# Patient Record
Sex: Female | Born: 1955 | Race: White | Hispanic: No | Marital: Married | Smoking: Current some day smoker
Health system: Southern US, Community
[De-identification: ages and names within clinical notes are randomized; demographics above are authoritative.]

## PROBLEM LIST (undated history)

## (undated) DIAGNOSIS — K649 Unspecified hemorrhoids: Secondary | ICD-10-CM

## (undated) DIAGNOSIS — K5790 Diverticulosis of intestine, part unspecified, without perforation or abscess without bleeding: Secondary | ICD-10-CM

## (undated) DIAGNOSIS — K219 Gastro-esophageal reflux disease without esophagitis: Secondary | ICD-10-CM

## (undated) DIAGNOSIS — C50919 Malignant neoplasm of unspecified site of unspecified female breast: Secondary | ICD-10-CM

## (undated) DIAGNOSIS — F410 Panic disorder [episodic paroxysmal anxiety] without agoraphobia: Secondary | ICD-10-CM

## (undated) DIAGNOSIS — D126 Benign neoplasm of colon, unspecified: Secondary | ICD-10-CM

## (undated) DIAGNOSIS — E042 Nontoxic multinodular goiter: Secondary | ICD-10-CM

## (undated) DIAGNOSIS — Z8489 Family history of other specified conditions: Secondary | ICD-10-CM

## (undated) DIAGNOSIS — K2970 Gastritis, unspecified, without bleeding: Secondary | ICD-10-CM

## (undated) DIAGNOSIS — M810 Age-related osteoporosis without current pathological fracture: Secondary | ICD-10-CM

## (undated) DIAGNOSIS — Z8619 Personal history of other infectious and parasitic diseases: Secondary | ICD-10-CM

## (undated) DIAGNOSIS — F329 Major depressive disorder, single episode, unspecified: Secondary | ICD-10-CM

## (undated) DIAGNOSIS — K635 Polyp of colon: Secondary | ICD-10-CM

## (undated) DIAGNOSIS — E0590 Thyrotoxicosis, unspecified without thyrotoxic crisis or storm: Secondary | ICD-10-CM

## (undated) DIAGNOSIS — Z923 Personal history of irradiation: Secondary | ICD-10-CM

## (undated) DIAGNOSIS — Z720 Tobacco use: Secondary | ICD-10-CM

## (undated) DIAGNOSIS — F32A Depression, unspecified: Secondary | ICD-10-CM

## (undated) DIAGNOSIS — J329 Chronic sinusitis, unspecified: Secondary | ICD-10-CM

## (undated) DIAGNOSIS — A048 Other specified bacterial intestinal infections: Secondary | ICD-10-CM

## (undated) DIAGNOSIS — H9192 Unspecified hearing loss, left ear: Secondary | ICD-10-CM

## (undated) DIAGNOSIS — C50912 Malignant neoplasm of unspecified site of left female breast: Secondary | ICD-10-CM

## (undated) DIAGNOSIS — J449 Chronic obstructive pulmonary disease, unspecified: Secondary | ICD-10-CM

## (undated) DIAGNOSIS — M1990 Unspecified osteoarthritis, unspecified site: Secondary | ICD-10-CM

## (undated) DIAGNOSIS — C3490 Malignant neoplasm of unspecified part of unspecified bronchus or lung: Secondary | ICD-10-CM

## (undated) DIAGNOSIS — I10 Essential (primary) hypertension: Secondary | ICD-10-CM

## (undated) HISTORY — DX: Benign neoplasm of colon, unspecified: D12.6

## (undated) HISTORY — DX: Unspecified hemorrhoids: K64.9

## (undated) HISTORY — PX: TONSILLECTOMY: SUR1361

## (undated) HISTORY — DX: Personal history of other infectious and parasitic diseases: Z86.19

## (undated) HISTORY — PX: TRIGGER FINGER RELEASE: SHX641

## (undated) HISTORY — DX: Depression, unspecified: F32.A

## (undated) HISTORY — DX: Polyp of colon: K63.5

## (undated) HISTORY — PX: ESOPHAGOGASTRODUODENOSCOPY: SHX1529

## (undated) HISTORY — DX: Major depressive disorder, single episode, unspecified: F32.9

## (undated) HISTORY — DX: Diverticulosis of intestine, part unspecified, without perforation or abscess without bleeding: K57.90

## (undated) HISTORY — PX: BREAST EXCISIONAL BIOPSY: SUR124

## (undated) HISTORY — DX: Tobacco use: Z72.0

## (undated) HISTORY — PX: BUNIONECTOMY: SHX129

## (undated) HISTORY — PX: MULTIPLE TOOTH EXTRACTIONS: SHX2053

## (undated) HISTORY — PX: OTHER SURGICAL HISTORY: SHX169

## (undated) HISTORY — PX: NASAL SEPTUM SURGERY: SHX37

## (undated) HISTORY — DX: Malignant neoplasm of unspecified site of left female breast: C50.912

## (undated) HISTORY — PX: GANGLION CYST EXCISION: SHX1691

---

## 2005-02-13 ENCOUNTER — Ambulatory Visit: Payer: Self-pay | Admitting: Internal Medicine

## 2005-09-26 ENCOUNTER — Ambulatory Visit: Payer: Self-pay | Admitting: Orthopedic Surgery

## 2006-03-30 ENCOUNTER — Ambulatory Visit: Payer: Self-pay | Admitting: Urology

## 2006-07-27 ENCOUNTER — Ambulatory Visit: Payer: Self-pay | Admitting: Unknown Physician Specialty

## 2007-09-24 ENCOUNTER — Ambulatory Visit: Payer: Self-pay | Admitting: Internal Medicine

## 2007-10-29 ENCOUNTER — Ambulatory Visit: Payer: Self-pay | Admitting: Gastroenterology

## 2008-08-11 ENCOUNTER — Ambulatory Visit: Payer: Self-pay | Admitting: Specialist

## 2008-08-13 ENCOUNTER — Ambulatory Visit: Payer: Self-pay | Admitting: Gastroenterology

## 2008-09-10 ENCOUNTER — Ambulatory Visit: Payer: Self-pay | Admitting: Specialist

## 2008-10-27 ENCOUNTER — Ambulatory Visit: Payer: Self-pay | Admitting: Gastroenterology

## 2009-05-12 ENCOUNTER — Ambulatory Visit: Payer: Self-pay | Admitting: Neurology

## 2010-02-08 ENCOUNTER — Ambulatory Visit: Payer: Self-pay | Admitting: Gastroenterology

## 2010-02-15 ENCOUNTER — Ambulatory Visit: Payer: Self-pay | Admitting: Unknown Physician Specialty

## 2010-03-22 ENCOUNTER — Ambulatory Visit: Payer: Self-pay | Admitting: Gastroenterology

## 2010-03-26 LAB — PATHOLOGY REPORT

## 2010-04-17 DIAGNOSIS — C50919 Malignant neoplasm of unspecified site of unspecified female breast: Secondary | ICD-10-CM

## 2010-04-17 DIAGNOSIS — C50912 Malignant neoplasm of unspecified site of left female breast: Secondary | ICD-10-CM

## 2010-04-17 HISTORY — DX: Malignant neoplasm of unspecified site of unspecified female breast: C50.919

## 2010-04-17 HISTORY — DX: Malignant neoplasm of unspecified site of left female breast: C50.912

## 2010-04-17 HISTORY — PX: BREAST EXCISIONAL BIOPSY: SUR124

## 2010-04-17 HISTORY — PX: BREAST LUMPECTOMY: SHX2

## 2010-07-17 HISTORY — PX: MASTECTOMY: SHX3

## 2010-07-21 ENCOUNTER — Ambulatory Visit: Payer: Self-pay | Admitting: Surgery

## 2010-08-01 ENCOUNTER — Ambulatory Visit: Payer: Self-pay | Admitting: Surgery

## 2010-08-08 ENCOUNTER — Ambulatory Visit: Payer: Self-pay | Admitting: Radiation Oncology

## 2010-08-11 ENCOUNTER — Ambulatory Visit: Payer: Self-pay | Admitting: Surgery

## 2010-08-15 LAB — PATHOLOGY REPORT

## 2010-08-16 ENCOUNTER — Ambulatory Visit: Payer: Self-pay | Admitting: Radiation Oncology

## 2010-08-24 ENCOUNTER — Ambulatory Visit: Payer: Self-pay | Admitting: Internal Medicine

## 2010-09-16 ENCOUNTER — Ambulatory Visit: Payer: Self-pay | Admitting: Radiation Oncology

## 2010-09-16 ENCOUNTER — Ambulatory Visit: Payer: Self-pay | Admitting: Internal Medicine

## 2010-10-16 ENCOUNTER — Ambulatory Visit: Payer: Self-pay | Admitting: Internal Medicine

## 2010-10-16 ENCOUNTER — Ambulatory Visit: Payer: Self-pay | Admitting: Radiation Oncology

## 2010-11-16 ENCOUNTER — Ambulatory Visit: Payer: Self-pay | Admitting: Internal Medicine

## 2010-11-16 ENCOUNTER — Ambulatory Visit: Payer: Self-pay | Admitting: Radiation Oncology

## 2011-02-03 ENCOUNTER — Emergency Department: Payer: Self-pay | Admitting: Emergency Medicine

## 2011-02-06 ENCOUNTER — Ambulatory Visit: Payer: Self-pay | Admitting: Internal Medicine

## 2011-02-16 ENCOUNTER — Ambulatory Visit: Payer: Self-pay | Admitting: Internal Medicine

## 2011-02-17 ENCOUNTER — Ambulatory Visit: Payer: Self-pay | Admitting: Gastroenterology

## 2011-02-21 LAB — PATHOLOGY REPORT

## 2011-05-08 ENCOUNTER — Ambulatory Visit: Payer: Self-pay | Admitting: Internal Medicine

## 2011-05-19 ENCOUNTER — Ambulatory Visit: Payer: Self-pay | Admitting: Internal Medicine

## 2011-08-07 ENCOUNTER — Ambulatory Visit: Payer: Self-pay | Admitting: Internal Medicine

## 2011-08-07 LAB — CBC CANCER CENTER
Basophil #: 0 x10 3/mm (ref 0.0–0.1)
Basophil %: 0.3 %
Eosinophil #: 0.1 x10 3/mm (ref 0.0–0.7)
Eosinophil %: 1.3 %
HGB: 14 g/dL (ref 12.0–16.0)
Lymphocyte %: 22.7 %
MCHC: 33.1 g/dL (ref 32.0–36.0)
MCV: 99 fL (ref 80–100)
Neutrophil #: 4.4 x10 3/mm (ref 1.4–6.5)
Neutrophil %: 65.8 %
RBC: 4.28 10*6/uL (ref 3.80–5.20)
RDW: 12.8 % (ref 11.5–14.5)
WBC: 6.7 x10 3/mm (ref 3.6–11.0)

## 2011-08-07 LAB — HEPATIC FUNCTION PANEL A (ARMC)
Alkaline Phosphatase: 91 U/L (ref 50–136)
Bilirubin, Direct: 0.2 mg/dL (ref 0.00–0.20)
Bilirubin,Total: 0.6 mg/dL (ref 0.2–1.0)
SGOT(AST): 12 U/L — ABNORMAL LOW (ref 15–37)
SGPT (ALT): 17 U/L

## 2011-08-07 LAB — CREATININE, SERUM: EGFR (Non-African Amer.): >60

## 2011-08-16 ENCOUNTER — Ambulatory Visit: Payer: Self-pay | Admitting: Internal Medicine

## 2011-10-30 ENCOUNTER — Ambulatory Visit: Payer: Self-pay | Admitting: Neurology

## 2011-10-30 LAB — CREATININE, SERUM: EGFR (Non-African Amer.): >60

## 2011-11-07 ENCOUNTER — Ambulatory Visit: Payer: Self-pay | Admitting: Gastroenterology

## 2012-02-06 ENCOUNTER — Ambulatory Visit: Payer: Self-pay | Admitting: Internal Medicine

## 2012-02-06 LAB — CBC CANCER CENTER
Basophil %: 0.4 %
Eosinophil #: 0.1 x10 3/mm (ref 0.0–0.7)
HCT: 45.1 % (ref 35.0–47.0)
HGB: 14.8 g/dL (ref 12.0–16.0)
Lymphocyte #: 3.3 x10 3/mm (ref 1.0–3.6)
Lymphocyte %: 42.1 %
MCH: 32.2 pg (ref 26.0–34.0)
MCHC: 32.8 g/dL (ref 32.0–36.0)
MCV: 98 fL (ref 80–100)
Monocyte #: 1 x10 3/mm — ABNORMAL HIGH (ref 0.2–0.9)
Neutrophil #: 3.4 x10 3/mm (ref 1.4–6.5)
Platelet: 236 x10 3/mm (ref 150–440)

## 2012-02-06 LAB — HEPATIC FUNCTION PANEL A (ARMC)
Albumin: 4.1 g/dL (ref 3.4–5.0)
Alkaline Phosphatase: 91 U/L (ref 50–136)
Bilirubin, Direct: 0.1 mg/dL (ref 0.00–0.20)
Bilirubin,Total: 0.7 mg/dL (ref 0.2–1.0)
SGOT(AST): 10 U/L — ABNORMAL LOW (ref 15–37)

## 2012-02-06 LAB — CREATININE, SERUM
Creatinine: 0.99 mg/dL (ref 0.60–1.30)
EGFR (Non-African Amer.): >60

## 2012-02-06 LAB — POTASSIUM: Potassium: 3.8 mmol/L (ref 3.5–5.1)

## 2012-02-06 LAB — SODIUM: Sodium: 136 mmol/L (ref 136–145)

## 2012-02-16 ENCOUNTER — Ambulatory Visit: Payer: Self-pay | Admitting: Internal Medicine

## 2012-04-17 ENCOUNTER — Ambulatory Visit: Payer: Self-pay | Admitting: Internal Medicine

## 2012-05-08 LAB — CBC CANCER CENTER
Basophil #: 0 x10 3/mm (ref 0.0–0.1)
Basophil %: 0.7 %
HCT: 41.2 % (ref 35.0–47.0)
HGB: 14.2 g/dL (ref 12.0–16.0)
Lymphocyte #: 2 x10 3/mm (ref 1.0–3.6)
MCHC: 34.4 g/dL (ref 32.0–36.0)
MCV: 97 fL (ref 80–100)
Monocyte #: 0.8 x10 3/mm (ref 0.2–0.9)
Monocyte %: 11.7 %
Neutrophil #: 3.6 x10 3/mm (ref 1.4–6.5)
Neutrophil %: 55.1 %
Platelet: 207 x10 3/mm (ref 150–440)
RBC: 4.27 10*6/uL (ref 3.80–5.20)
RDW: 12.2 % (ref 11.5–14.5)
WBC: 6.6 x10 3/mm (ref 3.6–11.0)

## 2012-05-08 LAB — CREATININE, SERUM
Creatinine: 0.9 mg/dL (ref 0.60–1.30)
EGFR (African American): >60
EGFR (Non-African Amer.): >60

## 2012-05-08 LAB — HEPATIC FUNCTION PANEL A (ARMC)
Albumin: 3.7 g/dL (ref 3.4–5.0)
Alkaline Phosphatase: 92 U/L (ref 50–136)
Bilirubin,Total: 0.7 mg/dL (ref 0.2–1.0)
SGOT(AST): 11 U/L — ABNORMAL LOW (ref 15–37)
SGPT (ALT): 14 U/L (ref 12–78)

## 2012-05-18 ENCOUNTER — Ambulatory Visit: Payer: Self-pay | Admitting: Internal Medicine

## 2012-10-22 ENCOUNTER — Ambulatory Visit: Payer: Self-pay | Admitting: Gastroenterology

## 2012-11-07 ENCOUNTER — Ambulatory Visit: Payer: Self-pay | Admitting: Internal Medicine

## 2012-11-12 LAB — CREATININE, SERUM
Creatinine: 0.89 mg/dL (ref 0.60–1.30)
EGFR (African American): >60

## 2012-11-12 LAB — CBC CANCER CENTER
Basophil #: 0.1 x10 3/mm (ref 0.0–0.1)
Eosinophil %: 1.1 %
HGB: 14.1 g/dL (ref 12.0–16.0)
Lymphocyte #: 2.5 x10 3/mm (ref 1.0–3.6)
Lymphocyte %: 31.9 %
MCV: 96 fL (ref 80–100)
Monocyte #: 0.9 x10 3/mm (ref 0.2–0.9)
Monocyte %: 10.7 %
Neutrophil %: 55.7 %
Platelet: 239 x10 3/mm (ref 150–440)
RDW: 12.9 % (ref 11.5–14.5)
WBC: 8 x10 3/mm (ref 3.6–11.0)

## 2012-11-12 LAB — HEPATIC FUNCTION PANEL A (ARMC)
Albumin: 3.6 g/dL (ref 3.4–5.0)
Bilirubin, Direct: 0.1 mg/dL (ref 0.00–0.20)
SGOT(AST): 21 U/L (ref 15–37)

## 2012-11-15 ENCOUNTER — Ambulatory Visit: Payer: Self-pay | Admitting: Internal Medicine

## 2013-03-10 ENCOUNTER — Ambulatory Visit: Payer: Self-pay | Admitting: Internal Medicine

## 2013-05-06 ENCOUNTER — Ambulatory Visit: Payer: Self-pay | Admitting: Internal Medicine

## 2013-05-16 LAB — CBC CANCER CENTER
Basophil #: 0 "x10 3/mm "
Basophil %: 0.6 %
Eosinophil #: 0.1 "x10 3/mm "
Eosinophil %: 1.5 %
HCT: 41.5 %
HGB: 13.7 g/dL
Lymphocyte %: 34.4 %
Lymphs Abs: 2.4 "x10 3/mm "
MCH: 32.1 pg
MCHC: 33 g/dL
MCV: 97 fL
Monocyte #: 0.8 "x10 3/mm "
Monocyte %: 11.4 %
Neutrophil #: 3.5 "x10 3/mm "
Neutrophil %: 52.1 %
Platelet: 223 "x10 3/mm "
RBC: 4.27 "x10 6/mm "
RDW: 12.7 %
WBC: 6.8 "x10 3/mm "

## 2013-05-16 LAB — HEPATIC FUNCTION PANEL A (ARMC)
ALBUMIN: 3.6 g/dL (ref 3.4–5.0)
ALK PHOS: 58 U/L
AST: 9 U/L — AB (ref 15–37)
Bilirubin, Direct: 0.1 mg/dL (ref 0.00–0.20)
Bilirubin,Total: 0.6 mg/dL (ref 0.2–1.0)
SGPT (ALT): 10 U/L — ABNORMAL LOW (ref 12–78)
TOTAL PROTEIN: 7.9 g/dL (ref 6.4–8.2)

## 2013-05-16 LAB — CREATININE, SERUM
Creatinine: 0.9 mg/dL
EGFR (African American): >60
EGFR (Non-African Amer.): >60

## 2013-05-18 ENCOUNTER — Ambulatory Visit: Payer: Self-pay | Admitting: Internal Medicine

## 2013-11-12 ENCOUNTER — Ambulatory Visit: Payer: Self-pay | Admitting: Internal Medicine

## 2013-11-12 LAB — CBC CANCER CENTER
Basophil #: 0 x10 3/mm (ref 0.0–0.1)
Basophil %: 0.4 %
EOS ABS: 0.1 x10 3/mm (ref 0.0–0.7)
Eosinophil %: 0.8 %
HCT: 43.4 % (ref 35.0–47.0)
HGB: 14.5 g/dL (ref 12.0–16.0)
Lymphocyte #: 2.4 x10 3/mm (ref 1.0–3.6)
Lymphocyte %: 30 %
MCH: 33 pg (ref 26.0–34.0)
MCHC: 33.5 g/dL (ref 32.0–36.0)
MCV: 98 fL (ref 80–100)
MONO ABS: 0.8 x10 3/mm (ref 0.2–0.9)
MONOS PCT: 10 %
Neutrophil #: 4.8 x10 3/mm (ref 1.4–6.5)
Neutrophil %: 58.8 %
Platelet: 215 x10 3/mm (ref 150–440)
RBC: 4.41 10*6/uL (ref 3.80–5.20)
RDW: 13 % (ref 11.5–14.5)
WBC: 8.1 x10 3/mm (ref 3.6–11.0)

## 2013-11-12 LAB — HEPATIC FUNCTION PANEL A (ARMC)
ALT: 15 U/L
AST: 10 U/L — AB (ref 15–37)
Albumin: 3.7 g/dL (ref 3.4–5.0)
Alkaline Phosphatase: 49 U/L
BILIRUBIN DIRECT: 0.1 mg/dL (ref 0.00–0.20)
BILIRUBIN TOTAL: 0.7 mg/dL (ref 0.2–1.0)
Total Protein: 7.6 g/dL (ref 6.4–8.2)

## 2013-11-12 LAB — CREATININE, SERUM
Creatinine: 0.97 mg/dL (ref 0.60–1.30)
EGFR (African American): >60

## 2013-11-15 ENCOUNTER — Ambulatory Visit: Payer: Self-pay | Admitting: Internal Medicine

## 2013-12-16 ENCOUNTER — Ambulatory Visit: Payer: Self-pay | Admitting: Internal Medicine

## 2014-03-11 ENCOUNTER — Ambulatory Visit: Payer: Self-pay | Admitting: Internal Medicine

## 2014-05-06 ENCOUNTER — Ambulatory Visit: Payer: Self-pay | Admitting: Internal Medicine

## 2014-05-18 ENCOUNTER — Ambulatory Visit: Payer: Self-pay | Admitting: Internal Medicine

## 2014-06-16 ENCOUNTER — Ambulatory Visit
Admit: 2014-06-16 | Disposition: A | Discharge status: Home / Self Care | Payer: Self-pay | Attending: Internal Medicine | Admitting: Internal Medicine

## 2014-07-15 ENCOUNTER — Ambulatory Visit
Admit: 2014-07-15 | Disposition: A | Discharge status: Home / Self Care | Payer: Self-pay | Attending: Vascular Surgery | Admitting: Vascular Surgery

## 2014-07-22 DIAGNOSIS — J449 Chronic obstructive pulmonary disease, unspecified: Secondary | ICD-10-CM | POA: Insufficient documentation

## 2014-07-22 DIAGNOSIS — I808 Phlebitis and thrombophlebitis of other sites: Secondary | ICD-10-CM | POA: Insufficient documentation

## 2014-07-31 ENCOUNTER — Other Ambulatory Visit: Payer: Self-pay | Admitting: Internal Medicine

## 2014-07-31 DIAGNOSIS — Z853 Personal history of malignant neoplasm of breast: Secondary | ICD-10-CM

## 2014-08-21 ENCOUNTER — Other Ambulatory Visit: Payer: Self-pay | Admitting: Rheumatology

## 2014-08-21 DIAGNOSIS — G8929 Other chronic pain: Secondary | ICD-10-CM

## 2014-08-21 DIAGNOSIS — M25512 Pain in left shoulder: Principal | ICD-10-CM

## 2014-08-28 ENCOUNTER — Ambulatory Visit
Admission: RE | Admit: 2014-08-28 | Discharge: 2014-08-28 | Disposition: A | Discharge status: Home / Self Care | Payer: 59 | Source: Ambulatory Visit | Attending: Rheumatology | Admitting: Rheumatology

## 2014-08-28 DIAGNOSIS — R531 Weakness: Secondary | ICD-10-CM | POA: Diagnosis present

## 2014-08-28 DIAGNOSIS — G8929 Other chronic pain: Secondary | ICD-10-CM

## 2014-08-28 DIAGNOSIS — M25512 Pain in left shoulder: Secondary | ICD-10-CM | POA: Diagnosis present

## 2014-08-28 DIAGNOSIS — X58XXXA Exposure to other specified factors, initial encounter: Secondary | ICD-10-CM | POA: Diagnosis not present

## 2014-08-28 DIAGNOSIS — S43432A Superior glenoid labrum lesion of left shoulder, initial encounter: Secondary | ICD-10-CM | POA: Insufficient documentation

## 2014-11-17 ENCOUNTER — Other Ambulatory Visit: Payer: Self-pay | Admitting: Gastroenterology

## 2014-11-17 DIAGNOSIS — E042 Nontoxic multinodular goiter: Secondary | ICD-10-CM

## 2014-11-20 ENCOUNTER — Ambulatory Visit
Admission: RE | Admit: 2014-11-20 | Discharge: 2014-11-20 | Disposition: A | Discharge status: Home / Self Care | Payer: 59 | Source: Ambulatory Visit | Attending: Gastroenterology | Admitting: Gastroenterology

## 2014-11-20 DIAGNOSIS — E042 Nontoxic multinodular goiter: Secondary | ICD-10-CM | POA: Insufficient documentation

## 2014-12-09 DIAGNOSIS — F17200 Nicotine dependence, unspecified, uncomplicated: Secondary | ICD-10-CM | POA: Insufficient documentation

## 2014-12-09 DIAGNOSIS — E0590 Thyrotoxicosis, unspecified without thyrotoxic crisis or storm: Secondary | ICD-10-CM | POA: Insufficient documentation

## 2014-12-09 DIAGNOSIS — E042 Nontoxic multinodular goiter: Secondary | ICD-10-CM | POA: Insufficient documentation

## 2014-12-25 ENCOUNTER — Encounter: Payer: Self-pay | Admitting: *Deleted

## 2014-12-28 ENCOUNTER — Ambulatory Visit: Payer: Medicare Other | Admitting: Certified Registered"

## 2014-12-28 ENCOUNTER — Encounter
Admission: RE | Disposition: A | Discharge status: Home / Self Care | Payer: Self-pay | Source: Ambulatory Visit | Attending: Gastroenterology

## 2014-12-28 ENCOUNTER — Ambulatory Visit
Admission: RE | Admit: 2014-12-28 | Discharge: 2014-12-28 | Disposition: A | Discharge status: Home / Self Care | Payer: Medicare Other | Source: Ambulatory Visit | Attending: Gastroenterology | Admitting: Gastroenterology

## 2014-12-28 ENCOUNTER — Encounter: Payer: Self-pay | Admitting: Anesthesiology

## 2014-12-28 DIAGNOSIS — Z881 Allergy status to other antibiotic agents status: Secondary | ICD-10-CM | POA: Insufficient documentation

## 2014-12-28 DIAGNOSIS — K621 Rectal polyp: Secondary | ICD-10-CM | POA: Insufficient documentation

## 2014-12-28 DIAGNOSIS — K219 Gastro-esophageal reflux disease without esophagitis: Secondary | ICD-10-CM | POA: Insufficient documentation

## 2014-12-28 DIAGNOSIS — F17210 Nicotine dependence, cigarettes, uncomplicated: Secondary | ICD-10-CM | POA: Insufficient documentation

## 2014-12-28 DIAGNOSIS — H918X2 Other specified hearing loss, left ear: Secondary | ICD-10-CM | POA: Diagnosis not present

## 2014-12-28 DIAGNOSIS — F419 Anxiety disorder, unspecified: Secondary | ICD-10-CM | POA: Insufficient documentation

## 2014-12-28 DIAGNOSIS — K6389 Other specified diseases of intestine: Secondary | ICD-10-CM | POA: Insufficient documentation

## 2014-12-28 DIAGNOSIS — Z888 Allergy status to other drugs, medicaments and biological substances status: Secondary | ICD-10-CM | POA: Insufficient documentation

## 2014-12-28 DIAGNOSIS — K648 Other hemorrhoids: Secondary | ICD-10-CM | POA: Insufficient documentation

## 2014-12-28 DIAGNOSIS — I10 Essential (primary) hypertension: Secondary | ICD-10-CM | POA: Insufficient documentation

## 2014-12-28 DIAGNOSIS — Z91040 Latex allergy status: Secondary | ICD-10-CM | POA: Insufficient documentation

## 2014-12-28 DIAGNOSIS — Z1211 Encounter for screening for malignant neoplasm of colon: Secondary | ICD-10-CM | POA: Diagnosis not present

## 2014-12-28 DIAGNOSIS — Z79899 Other long term (current) drug therapy: Secondary | ICD-10-CM | POA: Diagnosis not present

## 2014-12-28 DIAGNOSIS — Z853 Personal history of malignant neoplasm of breast: Secondary | ICD-10-CM | POA: Diagnosis not present

## 2014-12-28 DIAGNOSIS — Z882 Allergy status to sulfonamides status: Secondary | ICD-10-CM | POA: Insufficient documentation

## 2014-12-28 DIAGNOSIS — K5730 Diverticulosis of large intestine without perforation or abscess without bleeding: Secondary | ICD-10-CM | POA: Insufficient documentation

## 2014-12-28 DIAGNOSIS — D122 Benign neoplasm of ascending colon: Secondary | ICD-10-CM | POA: Diagnosis not present

## 2014-12-28 DIAGNOSIS — J449 Chronic obstructive pulmonary disease, unspecified: Secondary | ICD-10-CM | POA: Diagnosis not present

## 2014-12-28 DIAGNOSIS — Z86010 Personal history of colonic polyps: Secondary | ICD-10-CM | POA: Diagnosis not present

## 2014-12-28 DIAGNOSIS — M810 Age-related osteoporosis without current pathological fracture: Secondary | ICD-10-CM | POA: Diagnosis not present

## 2014-12-28 HISTORY — DX: Gastro-esophageal reflux disease without esophagitis: K21.9

## 2014-12-28 HISTORY — DX: Chronic sinusitis, unspecified: J32.9

## 2014-12-28 HISTORY — DX: Essential (primary) hypertension: I10

## 2014-12-28 HISTORY — DX: Panic disorder (episodic paroxysmal anxiety): F41.0

## 2014-12-28 HISTORY — DX: Chronic obstructive pulmonary disease, unspecified: J44.9

## 2014-12-28 HISTORY — DX: Unspecified hearing loss, left ear: H91.92

## 2014-12-28 HISTORY — DX: Gastritis, unspecified, without bleeding: K29.70

## 2014-12-28 HISTORY — PX: COLONOSCOPY WITH PROPOFOL: SHX5780

## 2014-12-28 HISTORY — DX: Age-related osteoporosis without current pathological fracture: M81.0

## 2014-12-28 HISTORY — DX: Nontoxic multinodular goiter: E04.2

## 2014-12-28 SURGERY — COLONOSCOPY WITH PROPOFOL
Anesthesia: General

## 2014-12-28 MED ORDER — MIDAZOLAM HCL 5 MG/5ML IJ SOLN
INTRAMUSCULAR | Status: DC | PRN
  Administered 2014-12-28: 1 mg via INTRAVENOUS

## 2014-12-28 MED ORDER — LIDOCAINE HCL (CARDIAC) 20 MG/ML IV SOLN
INTRAVENOUS | Status: DC | PRN
  Administered 2014-12-28: 50 mg via INTRAVENOUS

## 2014-12-28 MED ORDER — SODIUM CHLORIDE 0.9 % IV SOLN
INTRAVENOUS | Status: DC
  Administered 2014-12-28: 11:00:00 via INTRAVENOUS

## 2014-12-28 MED ORDER — PROPOFOL INFUSION 10 MG/ML OPTIME
INTRAVENOUS | Status: DC | PRN
  Administered 2014-12-28: 120 ug/kg/min via INTRAVENOUS

## 2014-12-28 MED ORDER — PROPOFOL 10 MG/ML IV BOLUS
INTRAVENOUS | Status: DC | PRN
  Administered 2014-12-28: 50 mg via INTRAVENOUS

## 2014-12-28 NOTE — Anesthesia Postprocedure Evaluation (Signed)
  Anesthesia Post-op Note  Patient: Katie Franklin  Procedure(s) Performed: Procedure(s): COLONOSCOPY WITH PROPOFOL (N/A)  Anesthesia type:General  Patient location: PACU  Post pain: Pain level controlled  Post assessment: Post-op Vital signs reviewed, Patient's Cardiovascular Status Stable, Respiratory Function Stable, Patent Airway and No signs of Nausea or vomiting  Post vital signs: Reviewed and stable  Last Vitals:  Filed Vitals:   12/28/14 1151  BP: 123/99  Pulse: 56  Temp:   Resp: 15    Level of consciousness: awake, alert  and patient cooperative  Complications: No apparent anesthesia complications

## 2014-12-28 NOTE — Op Note (Signed)
[facility] Gastroenterology Patient Name: Katie Franklin Procedure Date: 12/28/2014 10:36 AM MRN: [id number] Account #: [id number] Date of Birth: [birth year] Admit Type: Outpatient Age: 59 Room: [facility] ENDO ROOM 3 Gender: Female Note Status: Finalized Procedure:         Colonoscopy Indications:       Personal history of colonic polyps Providers:         Lollie Sails, MD Referring MD:      Youlanda Roys. Ola Spurr, MD (Referring MD) Medicines:         Monitored Anesthesia Care Complications:     No immediate complications. Procedure:         Pre-Anesthesia Assessment:                    - ASA Grade Assessment: III - A patient with severe                     systemic disease.                    After obtaining informed consent, the colonoscope was                     passed under direct vision. Throughout the procedure, the                     patient's blood pressure, pulse, and oxygen saturations                     were monitored continuously. The Colonoscope was                     introduced through the anus and advanced to the the cecum,                     identified by appendiceal orifice and ileocecal valve. The                     colonoscopy was performed with moderate difficulty due to                     significant looping and a tortuous colon. Successful                     completion of the procedure was aided by changing the                     patient to a supine position and using manual pressure.                     The patient tolerated the procedure well. The quality of                     the bowel preparation was good. Findings:      Multiple small-mouthed diverticula were found in the sigmoid colon.      A 6 mm polyp was found in the mid ascending colon. The polyp was       sessile. The polyp was removed with a hot snare. Resection and retrieval       were complete.      A 1 mm polyp was found in the rectum. The polyp was sessile. The polyp      was removed with a cold biopsy forceps. Resection and retrieval were  complete.      Non-bleeding internal hemorrhoids were found during anoscopy. The       hemorrhoids were small. Impression:        - Diverticulosis in the sigmoid colon.                    - One 6 mm polyp in the mid ascending colon. Resected and                     retrieved.                    - One 1 mm polyp in the rectum. Resected and retrieved.                    - Non-bleeding internal hemorrhoids. Recommendation:    - Discharge patient to home.                    - Telephone GI clinic for pathology results in 1 week. Procedure Code(s): --- Professional ---                    [phone], Colonoscopy, flexible; with removal of tumor(s),                     polyp(s), or other lesion(s) by snare technique                    45380, 52, Colonoscopy, flexible; with biopsy, single or                     multiple Diagnosis Code(s): --- Professional ---                    211.3, Benign neoplasm of colon                    569.0, Anal and rectal polyp                    455.0, Internal hemorrhoids without mention of complication                    V12.72, Personal history of colonic polyps                    562.10, Diverticulosis of colon (without mention of                     hemorrhage) CPT copyright 2014 American Medical Association. All rights reserved. The codes documented in this report are preliminary and upon coder review may  be revised to meet current compliance requirements. Lollie Sails, MD 12/28/2014 11:17:12 AM This report has been signed electronically. Number of Addenda: 0 Note Initiated On: 12/28/2014 10:36 AM Scope Withdrawal Time: 0 hours 13 minutes 17 seconds  Total Procedure Duration: 0 hours 28 minutes 49 seconds       [facility]

## 2014-12-28 NOTE — Transfer of Care (Signed)
Immediate Anesthesia Transfer of Care Note  Patient: Katie Franklin CID  Procedure(s) Performed: Procedure(s): COLONOSCOPY WITH PROPOFOL (N/A)  Patient Location: Endoscopy Unit  Anesthesia Type:General  Level of Consciousness: awake  Airway & Oxygen Therapy: Patient Spontanous Breathing and Patient connected to nasal cannula oxygen  Post-op Assessment: Report given to RN  Post vital signs: Reviewed  Last Vitals:  Filed Vitals:   12/28/14 1120  BP: 113/86  Pulse: 67  Temp: 35.7 C  Resp: 16    Complications: No apparent anesthesia complications

## 2014-12-28 NOTE — Anesthesia Preprocedure Evaluation (Signed)
Anesthesia Evaluation  Patient identified by MRN, date of birth, ID band Patient awake    Reviewed: Allergy & Precautions, NPO status , Patient's Chart, lab work & pertinent test results, reviewed documented beta blocker date and time   Airway Mallampati: II  TM Distance: >3 FB     Dental  (+) Chipped   Pulmonary COPD, Current Smoker,           Cardiovascular hypertension, Pt. on medications and Pt. on home beta blockers      Neuro/Psych PSYCHIATRIC DISORDERS Anxiety    GI/Hepatic GERD  ,  Endo/Other    Renal/GU      Musculoskeletal   Abdominal   Peds  Hematology   Anesthesia Other Findings   Reproductive/Obstetrics                             Anesthesia Physical Anesthesia Plan  ASA: III  Anesthesia Plan: General   Post-op Pain Management:    Induction: Intravenous  Airway Management Planned: Nasal Cannula  Additional Equipment:   Intra-op Plan:   Post-operative Plan:   Informed Consent: I have reviewed the patients History and Physical, chart, labs and discussed the procedure including the risks, benefits and alternatives for the proposed anesthesia with the patient or authorized representative who has indicated his/her understanding and acceptance.     Plan Discussed with: CRNA  Anesthesia Plan Comments:         Anesthesia Quick Evaluation

## 2014-12-28 NOTE — H&P (Signed)
Outpatient short stay form Pre-procedure 12/28/2014 10:33 AM Lollie Sails MD  Primary Physician: Dr. Adrian Prows  Reason for visit:  Colonoscopy  History of present illness:  Patient is a 59 year old female with a personal history of adenomatous colon polyps. Her last colonoscopy was 11/07/2011 with tubal adenomas noted on previous colonoscopies. She tolerated her prep well. She is taking no blood thinners or aspirin products.    Current facility-administered medications:  .  0.9 %  sodium chloride infusion, , Intravenous, Continuous, Lollie Sails, MD  Prescriptions prior to admission  Medication Sig Dispense Refill Last Dose  . acetaminophen (TYLENOL) 325 MG tablet Take 650 mg by mouth every 4 (four) hours as needed.     Marland Kitchen albuterol (PROVENTIL HFA;VENTOLIN HFA) 108 (90 BASE) MCG/ACT inhaler Inhale 2 puffs into the lungs every 6 (six) hours as needed for wheezing or shortness of breath.     . ALPRAZolam (XANAX) 0.25 MG tablet Take 0.25 mg by mouth 2 (two) times daily as needed for anxiety or sleep.     Marland Kitchen atenolol (TENORMIN) 50 MG tablet Take 50 mg by mouth daily.   12/28/2014 at 0900  . buPROPion (WELLBUTRIN SR) 150 MG 12 hr tablet Take 150 mg by mouth 2 (two) times daily.     . Calcium Carbonate-Vitamin D (CALTRATE 600+D PO) Take 1 tablet by mouth daily.     . Cholecalciferol 1000 UNITS TBDP Take 1 tablet by mouth daily.     Marland Kitchen eletriptan (RELPAX) 40 MG tablet Take 40 mg by mouth once. May repeat in 2 hours if headache persists or recurs.     Marland Kitchen exemestane (AROMASIN) 25 MG tablet Take 25 mg by mouth daily after breakfast.     . hydroxychloroquine (PLAQUENIL) 200 MG tablet Take by mouth daily.     . pantoprazole (PROTONIX) 40 MG tablet Take 40 mg by mouth daily.     . predniSONE (DELTASONE) 10 MG tablet Take 10 mg by mouth once a week.     . spironolactone (ALDACTONE) 50 MG tablet Take 50 mg by mouth daily.   12/28/2014 at 0900  . vitamin E 1000 UNIT capsule Take 1,000 Units  by mouth daily.     Marland Kitchen zolpidem (AMBIEN) 5 MG tablet Take 5 mg by mouth at bedtime as needed for sleep.        Allergies  Allergen Reactions  . Erythromycin   . Latex   . Septra Ds [Sulfamethoxazole-Trimethoprim]   . Tetracyclines & Related   . Sulfa Antibiotics      Past Medical History  Diagnosis Date  . Gastritis   . Recurrent sinusitis   . Hypertension   . Cancer     Breast (LT)  . COPD (chronic obstructive pulmonary disease)   . Multinodular goiter (nontoxic)   . GERD (gastroesophageal reflux disease)   . Panic disorder   . Osteoporosis   . Deafness in left ear     Review of systems:      Physical Exam    Heart and lungs: Regular rate and rhythm without rub or gallop, lungs are bilaterally clear.    HEENT: Normocephalic atraumatic eyes are anicteric    Other:     Pertinant exam for procedure: Soft nontender nondistended bowel sounds positive normoactive    Planned proceedures: Colonoscopy and indicated procedures I have discussed the risks benefits and complications of procedures to include not limited to bleeding, infection, perforation and the risk of sedation and the patient wishes to proceed.  Lollie Sails, MD Gastroenterology 12/28/2014  10:33 AM

## 2014-12-29 ENCOUNTER — Encounter: Payer: Self-pay | Admitting: Gastroenterology

## 2014-12-29 LAB — SURGICAL PATHOLOGY

## 2015-03-15 ENCOUNTER — Ambulatory Visit
Admission: RE | Admit: 2015-03-15 | Discharge: 2015-03-15 | Disposition: A | Discharge status: Home / Self Care | Payer: Medicare Other | Source: Ambulatory Visit | Attending: Internal Medicine | Admitting: Internal Medicine

## 2015-03-15 DIAGNOSIS — Z9889 Other specified postprocedural states: Secondary | ICD-10-CM | POA: Diagnosis not present

## 2015-03-15 DIAGNOSIS — Z853 Personal history of malignant neoplasm of breast: Secondary | ICD-10-CM | POA: Insufficient documentation

## 2015-06-09 ENCOUNTER — Inpatient Hospital Stay: Payer: Medicare Other | Attending: Internal Medicine

## 2015-06-09 ENCOUNTER — Encounter: Payer: Self-pay | Admitting: *Deleted

## 2015-06-09 ENCOUNTER — Encounter: Payer: Self-pay | Admitting: Internal Medicine

## 2015-06-09 ENCOUNTER — Other Ambulatory Visit: Payer: Self-pay | Admitting: *Deleted

## 2015-06-09 ENCOUNTER — Inpatient Hospital Stay (HOSPITAL_BASED_OUTPATIENT_CLINIC_OR_DEPARTMENT_OTHER): Payer: Medicare Other | Admitting: Internal Medicine

## 2015-06-09 ENCOUNTER — Ambulatory Visit: Payer: Self-pay | Admitting: Internal Medicine

## 2015-06-09 VITALS — BP 143/88 | HR 75 | Temp 96.4°F | Resp 18 | Ht 65.0 in | Wt 108.2 lb

## 2015-06-09 DIAGNOSIS — J449 Chronic obstructive pulmonary disease, unspecified: Secondary | ICD-10-CM | POA: Insufficient documentation

## 2015-06-09 DIAGNOSIS — Z170 Estrogen receptor positive status [ER+]: Secondary | ICD-10-CM

## 2015-06-09 DIAGNOSIS — K219 Gastro-esophageal reflux disease without esophagitis: Secondary | ICD-10-CM | POA: Insufficient documentation

## 2015-06-09 DIAGNOSIS — Z923 Personal history of irradiation: Secondary | ICD-10-CM | POA: Insufficient documentation

## 2015-06-09 DIAGNOSIS — F17210 Nicotine dependence, cigarettes, uncomplicated: Secondary | ICD-10-CM

## 2015-06-09 DIAGNOSIS — Z9012 Acquired absence of left breast and nipple: Secondary | ICD-10-CM | POA: Insufficient documentation

## 2015-06-09 DIAGNOSIS — Z86010 Personal history of colonic polyps: Secondary | ICD-10-CM | POA: Insufficient documentation

## 2015-06-09 DIAGNOSIS — I10 Essential (primary) hypertension: Secondary | ICD-10-CM | POA: Insufficient documentation

## 2015-06-09 DIAGNOSIS — R634 Abnormal weight loss: Secondary | ICD-10-CM | POA: Insufficient documentation

## 2015-06-09 DIAGNOSIS — Z79811 Long term (current) use of aromatase inhibitors: Secondary | ICD-10-CM | POA: Diagnosis not present

## 2015-06-09 DIAGNOSIS — F329 Major depressive disorder, single episode, unspecified: Secondary | ICD-10-CM | POA: Insufficient documentation

## 2015-06-09 DIAGNOSIS — R05 Cough: Secondary | ICD-10-CM

## 2015-06-09 DIAGNOSIS — C50912 Malignant neoplasm of unspecified site of left female breast: Secondary | ICD-10-CM

## 2015-06-09 DIAGNOSIS — Z79899 Other long term (current) drug therapy: Secondary | ICD-10-CM | POA: Diagnosis not present

## 2015-06-09 DIAGNOSIS — Z853 Personal history of malignant neoplasm of breast: Secondary | ICD-10-CM

## 2015-06-09 DIAGNOSIS — R059 Cough, unspecified: Secondary | ICD-10-CM

## 2015-06-09 LAB — CBC WITH DIFFERENTIAL/PLATELET
Basophils Absolute: 0.1 10*3/uL (ref 0–0.1)
Basophils Relative: 1 %
EOS ABS: 0.1 10*3/uL (ref 0–0.7)
Eosinophils Relative: 1 %
HEMATOCRIT: 44 % (ref 35.0–47.0)
Hemoglobin: 15.1 g/dL (ref 12.0–16.0)
Lymphocytes Relative: 36 %
Lymphs Abs: 2.7 10*3/uL (ref 1.0–3.6)
MCH: 33.2 pg (ref 26.0–34.0)
MCHC: 34.4 g/dL (ref 32.0–36.0)
MCV: 96.5 fL (ref 80.0–100.0)
Monocytes Absolute: 0.8 10*3/uL (ref 0.2–0.9)
Monocytes Relative: 11 %
NEUTROS PCT: 51 %
Neutro Abs: 3.9 10*3/uL (ref 1.4–6.5)
PLATELETS: 220 10*3/uL (ref 150–440)
RBC: 4.56 MIL/uL (ref 3.80–5.20)
RDW: 13 % (ref 11.5–14.5)
WBC: 7.5 10*3/uL (ref 3.6–11.0)

## 2015-06-09 LAB — COMPREHENSIVE METABOLIC PANEL
ALT: 10 U/L — ABNORMAL LOW (ref 14–54)
AST: 13 U/L — AB (ref 15–41)
Albumin: 4.6 g/dL (ref 3.5–5.0)
Alkaline Phosphatase: 38 U/L (ref 38–126)
Anion gap: 7 (ref 5–15)
BUN: 9 mg/dL (ref 6–20)
CHLORIDE: 101 mmol/L (ref 101–111)
CO2: 25 mmol/L (ref 22–32)
Calcium: 9.3 mg/dL (ref 8.9–10.3)
Creatinine, Ser: 0.77 mg/dL (ref 0.44–1.00)
GFR calc Af Amer: >60 mL/min (ref >60)
GFR calc non Af Amer: >60 mL/min (ref >60)
GLUCOSE: 104 mg/dL — AB (ref 65–99)
POTASSIUM: 3.7 mmol/L (ref 3.5–5.1)
Sodium: 133 mmol/L — ABNORMAL LOW (ref 135–145)
Total Bilirubin: 0.9 mg/dL (ref 0.3–1.2)
Total Protein: 7.7 g/dL (ref 6.5–8.1)

## 2015-06-09 NOTE — Progress Notes (Signed)
[facility] OFFICE PROGRESS NOTE  Patient Care Team: Adrian Prows, MD as PCP - General (Infectious Diseases)   SUMMARY OF ONCOLOGIC HISTORY:  # 2012- LEFT BREAST [pt1b- STAGE I; G-2];ER/PR-Pos; her 2 Neu-NEG;  RS- 21 [risk of recurrence 13%];s/p Lumpec& SLNBx; NO chemo; s/p RT; On Aromasin  [finish April 2017]  # Smoker  INTERVAL HISTORY:  This is my first interaction with the patient since I joined the practice September 2016. I reviewed the patient's prior charts/pertinent labs/imaging in detail; findings are summarized above.   A pleasant 60 year old female patient with above history of stage I breast cancer ER/PR positive HER-2 negative- status post lumpectomy followed by radiation; no chemotherapy; currently on adjuvant Aromasin.  Patient complains of cough; shortness of breath on exertion. She has lost weight which is fairly abnormal for her. Otherwise no nausea no vomiting. No lumps or bumps.   REVIEW OF SYSTEMS:  A complete 10 point review of system is done which is negative except mentioned above/history of present illness.   PAST MEDICAL HISTORY :  Past Medical History  Diagnosis Date  . Gastritis   . Recurrent sinusitis   . Hypertension   . COPD (chronic obstructive pulmonary disease) ([place])   . Multinodular goiter (nontoxic)   . GERD (gastroesophageal reflux disease)   . Panic disorder   . Osteoporosis   . Deafness in left ear   . 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 Hemorrhoids   . Diverticulosis   . Hyperplastic colon polyp   . Tubular adenoma of colon   . History of chicken pox   . Depression   . H/O herpes labialis   . Tobacco use   . Recurrent sinusitis     PAST SURGICAL HISTORY :   Past Surgical History  Procedure Laterality Date  . Tonsillectomy    . Mastectomy, partial Left   . Nasal septum surgery    . Incision tendon sheath for trigger finger Left   . Colonoscopy with propofol N/A 12/28/2014    Procedure:  COLONOSCOPY WITH PROPOFOL;  Surgeon: Lollie Sails, MD;  Location[facility] ENDOSCOPY;  Service: Endoscopy;  Laterality: N/A;  . Breast excisional biopsy Left     negative 1980's twice  . Breast excisional biopsy Left     positive 2012  . Esophagogastroduodenoscopy      FAMILY HISTORY :   Family History  Problem Relation Age of Onset  . Breast cancer Maternal Aunt 90    great aunt  . Prostate cancer Father   . Hypertension Father   . Alzheimer's disease Mother   . Heart disease Mother   . Hypertension Mother   . Colon polyps Mother   . Depression Brother     brother #2    SOCIAL HISTORY:   Social History  Substance Use Topics  . Smoking status: Current Every Day Smoker -- 0.50 packs/day    Types: Cigarettes  . Smokeless tobacco: Never Used  . Alcohol Use: No    ALLERGIES:  is allergic to erythromycin; latex; septra ds; tetracyclines & related; and sulfa antibiotics.  MEDICATIONS:  Current Outpatient Prescriptions  Medication Sig Dispense Refill  . acetaminophen (TYLENOL) 325 MG tablet Take 650 mg by mouth every 4 (four) hours as needed.    Marland Kitchen albuterol (PROVENTIL HFA;VENTOLIN HFA) 108 (90 BASE) MCG/ACT inhaler Inhale 2 puffs into the lungs every 6 (six) hours as needed for wheezing or shortness of breath.    . ALPRAZolam (XANAX) 0.25 MG  tablet Take 0.25 mg by mouth 2 (two) times daily as needed for anxiety or sleep.    Marland Kitchen atenolol (TENORMIN) 50 MG tablet Take 50 mg by mouth daily.    Marland Kitchen buPROPion (WELLBUTRIN SR) 150 MG 12 hr tablet Take 150 mg by mouth 2 (two) times daily.    . Calcium Carbonate-Vitamin D (CALTRATE 600+D PO) Take 1 tablet by mouth daily.    . Cholecalciferol 1000 UNITS TBDP Take 1 tablet by mouth daily.    Marland Kitchen eletriptan (RELPAX) 40 MG tablet Take 40 mg by mouth once. May repeat in 2 hours if headache persists or recurs.    Marland Kitchen exemestane (AROMASIN) 25 MG tablet Take 25 mg by mouth daily after breakfast.    . hydroxychloroquine (PLAQUENIL) 200 MG tablet Take by  mouth daily.    . pantoprazole (PROTONIX) 40 MG tablet Take 40 mg by mouth daily.    . predniSONE (DELTASONE) 10 MG tablet Take 10 mg by mouth once a week.    . spironolactone (ALDACTONE) 50 MG tablet Take 50 mg by mouth daily.    . vitamin E 1000 UNIT capsule Take 1,000 Units by mouth daily.    Marland Kitchen zolpidem (AMBIEN) 5 MG tablet Take 5 mg by mouth at bedtime as needed for sleep.     No current facility-administered medications for this visit.    PHYSICAL EXAMINATION: ECOG PERFORMANCE STATUS: 0 - Asymptomatic  BP 143/88 mmHg  Pulse 75  Temp(Src) 96.4 F (35.8 C) (Tympanic)  Resp 18  Ht _0  (1.651 m)  Wt 108 lb 3.9 oz (49.1 kg)  BMI 18.01 kg/m2  Filed Weights   06/09/15 1522  Weight: 108 lb 3.9 oz (49.1 kg)    GENERAL: Well-nourished well-developed; Alert, no distress and comfortable.   Alone.  EYES: no pallor or icterus OROPHARYNX: no thrush or ulceration; poor dentition  NECK: supple, no masses felt LYMPH:  no palpable lymphadenopathy in the cervical, axillary or inguinal regions LUNGS: clear to auscultation and  No wheeze or crackles HEART/CVS: regular rate & rhythm and no murmurs; No lower extremity edema ABDOMEN:abdomen soft, non-tender and normal bowel sounds Musculoskeletal:no cyanosis of digits and no clubbing  PSYCH: alert & oriented x 3 with fluent speech NEURO: no focal motor/sensory deficits SKIN:  no rashes or significant lesions Right and left BREAST exam [in the presence of nurse]- no unusual skin changes or dominant masses felt. Surgical scars noted.    LABORATORY DATA:  I have reviewed the data as listed    Component Value Date/Time   NA 133* 06/09/2015 1451   NA 136 02/06/2012 0957   K 3.7 06/09/2015 1451   K 3.8 02/06/2012 0957   CL 101 06/09/2015 1451   CO2 25 06/09/2015 1451   GLUCOSE 104* 06/09/2015 1451   BUN 9 06/09/2015 1451   CREATININE 0.77 06/09/2015 1451   CREATININE 0.97 11/12/2013 1508   CALCIUM 9.3 06/09/2015 1451   CALCIUM 9.5  05/08/2012 1018   PROT 7.7 06/09/2015 1451   PROT 7.6 11/12/2013 1508   ALBUMIN 4.6 06/09/2015 1451   ALBUMIN 3.7 11/12/2013 1508   AST 13* 06/09/2015 1451   AST 10* 11/12/2013 1508   ALT 10* 06/09/2015 1451   ALT 15 11/12/2013 1508   ALKPHOS 38 06/09/2015 1451   ALKPHOS 49 11/12/2013 1508   BILITOT 0.9 06/09/2015 1451   BILITOT 0.7 11/12/2013 1508   GFRNONAA >60 06/09/2015 1451   GFRNONAA >60 11/12/2013 1508   GFRAA >60 06/09/2015 1451  GFRAA >60 11/12/2013 1508    No results found for: SPEP, UPEP  Lab Results  Component Value Date   WBC 7.5 06/09/2015   NEUTROABS 3.9 06/09/2015   HGB 15.1 06/09/2015   HCT 44.0 06/09/2015   MCV 96.5 06/09/2015   PLT 220 06/09/2015      Chemistry      Component Value Date/Time   NA 133* 06/09/2015 1451   NA 136 02/06/2012 0957   K 3.7 06/09/2015 1451   K 3.8 02/06/2012 0957   CL 101 06/09/2015 1451   CO2 25 06/09/2015 1451   BUN 9 06/09/2015 1451   CREATININE 0.77 06/09/2015 1451   CREATININE 0.97 11/12/2013 1508      Component Value Date/Time   CALCIUM 9.3 06/09/2015 1451   CALCIUM 9.5 05/08/2012 1018   ALKPHOS 38 06/09/2015 1451   ALKPHOS 49 11/12/2013 1508   AST 13* 06/09/2015 1451   AST 10* 11/12/2013 1508   ALT 10* 06/09/2015 1451   ALT 15 11/12/2013 1508   BILITOT 0.9 06/09/2015 1451   BILITOT 0.7 11/12/2013 1508         ASSESSMENT & PLAN:   # LEFT BREAST CANCER- STAGE I- ER/PR- Positive; Her 2 Neg- on aromasin. No evidence of recurrence [see discussion below]. Discussed the data Re: Extended antihormone therapy. Patient declined. Will finish in April 2017.   # abnormal weight loss-/cough- history of smoking- recommend CT chest abdomen pelvis  # Smoking/discussed lung cancer screening- counseled pt regarding quitting smoking.  # If CT chest abdomen pelvis is non-concerning; she'll follow-up with me in 12 months with CBC CMP  # 25 minutes face-to-face with the patient discussing the above plan of care; more  than 50% of time spent on prognosis/ natural history; counseling and coordination.    Cammie Sickle, MD 06/09/2015 3:30 PM

## 2015-06-14 ENCOUNTER — Telehealth: Payer: Self-pay | Admitting: *Deleted

## 2015-06-14 ENCOUNTER — Ambulatory Visit
Admission: RE | Admit: 2015-06-14 | Discharge: 2015-06-14 | Disposition: A | Discharge status: Home / Self Care | Payer: Medicare Other | Source: Ambulatory Visit | Attending: Internal Medicine | Admitting: Internal Medicine

## 2015-06-14 DIAGNOSIS — R634 Abnormal weight loss: Secondary | ICD-10-CM

## 2015-06-14 DIAGNOSIS — J439 Emphysema, unspecified: Secondary | ICD-10-CM | POA: Diagnosis not present

## 2015-06-14 DIAGNOSIS — R05 Cough: Secondary | ICD-10-CM | POA: Diagnosis present

## 2015-06-14 DIAGNOSIS — R059 Cough, unspecified: Secondary | ICD-10-CM

## 2015-06-14 MED ORDER — IOHEXOL 300 MG/ML  SOLN
85.0000 mL | Freq: Once | INTRAMUSCULAR | Status: AC | PRN
  Administered 2015-06-14: 85 mL via INTRAVENOUS

## 2015-06-14 NOTE — Telephone Encounter (Signed)
Pt contacted with results. She will f/u with md in 1 year. I will refer her to [facility] to discuss ct lung cancer screening. She asked with this would need to be coordinated. I explained that patient will not need the ct scan any time soon since she just had the ct scan of chest.

## 2015-06-14 NOTE — Telephone Encounter (Signed)
-----   Message from Cammie Sickle, MD sent at 06/14/2015  2:30 PM EST ----- Nira Conn- Please inform patient that CT  Chest and pelvis is negative for any cancer.  Follow-up is planned with me;  Also refer the patient to lung  Cancer screening clinic. Thx

## 2015-07-23 ENCOUNTER — Other Ambulatory Visit: Payer: Self-pay | Admitting: Infectious Diseases

## 2015-07-23 DIAGNOSIS — R591 Generalized enlarged lymph nodes: Secondary | ICD-10-CM | POA: Insufficient documentation

## 2015-07-23 DIAGNOSIS — E042 Nontoxic multinodular goiter: Secondary | ICD-10-CM

## 2015-07-23 DIAGNOSIS — R634 Abnormal weight loss: Secondary | ICD-10-CM

## 2015-07-23 DIAGNOSIS — E0590 Thyrotoxicosis, unspecified without thyrotoxic crisis or storm: Secondary | ICD-10-CM

## 2015-08-02 ENCOUNTER — Ambulatory Visit
Admission: RE | Admit: 2015-08-02 | Discharge: 2015-08-02 | Disposition: A | Discharge status: Home / Self Care | Payer: Medicare Other | Source: Ambulatory Visit | Attending: Infectious Diseases | Admitting: Infectious Diseases

## 2015-08-02 DIAGNOSIS — E0590 Thyrotoxicosis, unspecified without thyrotoxic crisis or storm: Secondary | ICD-10-CM | POA: Diagnosis not present

## 2015-08-02 DIAGNOSIS — R591 Generalized enlarged lymph nodes: Secondary | ICD-10-CM | POA: Diagnosis not present

## 2015-08-02 DIAGNOSIS — R634 Abnormal weight loss: Secondary | ICD-10-CM | POA: Insufficient documentation

## 2015-08-02 DIAGNOSIS — E042 Nontoxic multinodular goiter: Secondary | ICD-10-CM | POA: Insufficient documentation

## 2015-08-02 MED ORDER — IOPAMIDOL (ISOVUE-300) INJECTION 61%
75.0000 mL | Freq: Once | INTRAVENOUS | Status: AC | PRN
  Administered 2015-08-02: 75 mL via INTRAVENOUS

## 2015-12-01 ENCOUNTER — Encounter: Payer: Self-pay | Admitting: *Deleted

## 2015-12-02 ENCOUNTER — Encounter: Payer: Self-pay | Admitting: *Deleted

## 2015-12-02 ENCOUNTER — Ambulatory Visit: Payer: Medicare Other | Admitting: Anesthesiology

## 2015-12-02 ENCOUNTER — Encounter
Admission: RE | Disposition: A | Discharge status: Home / Self Care | Payer: Self-pay | Source: Ambulatory Visit | Attending: Gastroenterology

## 2015-12-02 ENCOUNTER — Ambulatory Visit
Admission: RE | Admit: 2015-12-02 | Discharge: 2015-12-02 | Disposition: A | Discharge status: Home / Self Care | Payer: Medicare Other | Source: Ambulatory Visit | Attending: Gastroenterology | Admitting: Gastroenterology

## 2015-12-02 DIAGNOSIS — F419 Anxiety disorder, unspecified: Secondary | ICD-10-CM | POA: Insufficient documentation

## 2015-12-02 DIAGNOSIS — Z8619 Personal history of other infectious and parasitic diseases: Secondary | ICD-10-CM | POA: Insufficient documentation

## 2015-12-02 DIAGNOSIS — I10 Essential (primary) hypertension: Secondary | ICD-10-CM | POA: Insufficient documentation

## 2015-12-02 DIAGNOSIS — Z91040 Latex allergy status: Secondary | ICD-10-CM | POA: Diagnosis not present

## 2015-12-02 DIAGNOSIS — Z79899 Other long term (current) drug therapy: Secondary | ICD-10-CM | POA: Diagnosis not present

## 2015-12-02 DIAGNOSIS — J449 Chronic obstructive pulmonary disease, unspecified: Secondary | ICD-10-CM | POA: Insufficient documentation

## 2015-12-02 DIAGNOSIS — H9192 Unspecified hearing loss, left ear: Secondary | ICD-10-CM | POA: Insufficient documentation

## 2015-12-02 DIAGNOSIS — R1310 Dysphagia, unspecified: Secondary | ICD-10-CM | POA: Diagnosis not present

## 2015-12-02 DIAGNOSIS — K228 Other specified diseases of esophagus: Secondary | ICD-10-CM | POA: Insufficient documentation

## 2015-12-02 DIAGNOSIS — F17200 Nicotine dependence, unspecified, uncomplicated: Secondary | ICD-10-CM | POA: Insufficient documentation

## 2015-12-02 DIAGNOSIS — M810 Age-related osteoporosis without current pathological fracture: Secondary | ICD-10-CM | POA: Diagnosis not present

## 2015-12-02 DIAGNOSIS — Z882 Allergy status to sulfonamides status: Secondary | ICD-10-CM | POA: Insufficient documentation

## 2015-12-02 DIAGNOSIS — F329 Major depressive disorder, single episode, unspecified: Secondary | ICD-10-CM | POA: Insufficient documentation

## 2015-12-02 DIAGNOSIS — E042 Nontoxic multinodular goiter: Secondary | ICD-10-CM | POA: Diagnosis not present

## 2015-12-02 DIAGNOSIS — Z853 Personal history of malignant neoplasm of breast: Secondary | ICD-10-CM | POA: Insufficient documentation

## 2015-12-02 DIAGNOSIS — Z881 Allergy status to other antibiotic agents status: Secondary | ICD-10-CM | POA: Insufficient documentation

## 2015-12-02 DIAGNOSIS — R1013 Epigastric pain: Secondary | ICD-10-CM | POA: Insufficient documentation

## 2015-12-02 DIAGNOSIS — K219 Gastro-esophageal reflux disease without esophagitis: Secondary | ICD-10-CM | POA: Diagnosis not present

## 2015-12-02 DIAGNOSIS — K5790 Diverticulosis of intestine, part unspecified, without perforation or abscess without bleeding: Secondary | ICD-10-CM | POA: Diagnosis not present

## 2015-12-02 DIAGNOSIS — R634 Abnormal weight loss: Secondary | ICD-10-CM | POA: Diagnosis present

## 2015-12-02 DIAGNOSIS — Z883 Allergy status to other anti-infective agents status: Secondary | ICD-10-CM | POA: Diagnosis not present

## 2015-12-02 DIAGNOSIS — K2950 Unspecified chronic gastritis without bleeding: Secondary | ICD-10-CM | POA: Insufficient documentation

## 2015-12-02 DIAGNOSIS — K3189 Other diseases of stomach and duodenum: Secondary | ICD-10-CM | POA: Insufficient documentation

## 2015-12-02 HISTORY — PX: ESOPHAGOGASTRODUODENOSCOPY (EGD) WITH PROPOFOL: SHX5813

## 2015-12-02 SURGERY — ESOPHAGOGASTRODUODENOSCOPY (EGD) WITH PROPOFOL
Anesthesia: General

## 2015-12-02 MED ORDER — FENTANYL CITRATE (PF) 100 MCG/2ML IJ SOLN
INTRAMUSCULAR | Status: DC | PRN
  Administered 2015-12-02: 50 ug via INTRAVENOUS

## 2015-12-02 MED ORDER — SODIUM CHLORIDE 0.9 % IV SOLN
INTRAVENOUS | Status: DC
Start: 2015-12-02 — End: 2015-12-02

## 2015-12-02 MED ORDER — PROPOFOL 10 MG/ML IV BOLUS
INTRAVENOUS | Status: DC | PRN
  Administered 2015-12-02: 10 mg via INTRAVENOUS
  Administered 2015-12-02: 30 mg via INTRAVENOUS

## 2015-12-02 MED ORDER — SODIUM CHLORIDE 0.9 % IV SOLN
INTRAVENOUS | Status: DC
  Administered 2015-12-02: 1000 mL via INTRAVENOUS

## 2015-12-02 MED ORDER — MIDAZOLAM HCL 2 MG/2ML IJ SOLN
INTRAMUSCULAR | Status: DC | PRN
  Administered 2015-12-02: 1 mg via INTRAVENOUS

## 2015-12-02 MED ORDER — PROPOFOL 500 MG/50ML IV EMUL
INTRAVENOUS | Status: DC | PRN
  Administered 2015-12-02: 120 ug/kg/min via INTRAVENOUS

## 2015-12-02 NOTE — Op Note (Signed)
[facility] Gastroenterology Patient Name: Sahira Cataldi Procedure Date: 12/02/2015 10:15 AM MRN: [id number] Account #: [id number] Date of Birth: Apr 22, 1955 Admit Type: Outpatient Age: 60 Room: [facility] ENDO ROOM 2 Gender: Female Note Status: Finalized Procedure:            Upper GI endoscopy Indications:          Epigastric abdominal pain, Gastro-esophageal reflux                        disease, Weight loss Providers:            Lollie Sails, MD Referring MD:         Adrian Prows (Referring MD) Medicines:            Monitored Anesthesia Care Complications:        No immediate complications. Procedure:            Pre-Anesthesia Assessment:                       - ASA Grade Assessment: III - A patient with severe                        systemic disease.                       After obtaining informed consent, the endoscope was                        passed under direct vision. Throughout the procedure,                        the patient's blood pressure, pulse, and oxygen                        saturations were monitored continuously. The Endoscope                        was introduced through the mouth, and advanced to the                        third part of duodenum. The upper GI endoscopy was                        accomplished without difficulty. Findings:      The Z-line was variable. Biopsies were taken with a cold forceps for       histology.      Diffuse mild inflammation characterized by congestion (edema), erythema       and granularity was found in the gastric body and in the gastric antrum.       Biopsies were taken with a cold forceps for histology. Biopsies were       taken with a cold forceps for Helicobacter pylori testing.      The cardia and gastric fundus were normal on retroflexion otherwise.      A single 4 mm mucosal nodule was found in the duodenal bulb. Biopsies       were taken with a cold forceps for histology.      Diffuse mild  mucosal variance characterized by altered texture was found       in the entire duodenum.      Patchy mild inflammation characterized  by congestion (edema) and       erythema was found in the duodenal bulb. Impression:           - Z-line variable. Biopsied.                       - Gastritis. Biopsied.                       - Mucosal nodule found in the duodenum. Biopsied.                       - Mucosal variant in the duodenum. Recommendation:       - Discharge patient to home.                       - Continue present medications.                       - Await pathology results. Procedure Code(s):    --- Professional ---                       [phone], Esophagogastroduodenoscopy, flexible, transoral;                        with biopsy, single or multiple Diagnosis Code(s):    --- Professional ---                       K22.8, Other specified diseases of esophagus                       K29.70, Gastritis, unspecified, without bleeding                       K31.89, Other diseases of stomach and duodenum                       R10.13, Epigastric pain                       K21.9, Gastro-esophageal reflux disease without                        esophagitis                       R63.4, Abnormal weight loss CPT copyright 2016 American Medical Association. All rights reserved. The codes documented in this report are preliminary and upon coder review may  be revised to meet current compliance requirements. Lollie Sails, MD 12/02/2015 10:42:43 AM This report has been signed electronically. Number of Addenda: 0 Note Initiated On: 12/02/2015 10:15 AM      [facility]

## 2015-12-02 NOTE — Anesthesia Postprocedure Evaluation (Signed)
Anesthesia Post Note  Patient: Katie Franklin  Procedure(s) Performed: Procedure(s) (LRB): ESOPHAGOGASTRODUODENOSCOPY (EGD) WITH PROPOFOL (N/A)  Patient location during evaluation: PACU Anesthesia Type: General Level of consciousness: awake Pain management: pain level controlled Respiratory status: spontaneous breathing Cardiovascular status: stable Anesthetic complications: no    Last Vitals:  Vitals:   12/02/15 1100 12/02/15 1110  BP: 132/89 (!) 107/92  Pulse:    Resp:    Temp:      Last Pain:  Vitals:   12/02/15 1044  TempSrc: Tympanic                 VAN STAVEREN,Siyah Mault

## 2015-12-02 NOTE — H&P (Signed)
Outpatient short stay form Pre-procedure 12/02/2015 10:14 AM Lollie Sails MD  Primary Physician: Dr. Adrian Prows  Reason for visit:  EGD  History of present illness:  Patient is a 60 year old female presenting today for EGD as above. Patient has been having increasing issues with reflux as well as epigastric pain for 6-8 months. Further she has had a significant amount of weight loss over the period of the past year. Patient does not take any aspirin products or blood thinning agents.    Current Facility-Administered Medications:  .  0.9 %  sodium chloride infusion, , Intravenous, Continuous, Lollie Sails, MD, Last Rate: 20 mL/hr at 12/02/15 0845, 1,000 mL at 12/02/15 0845 .  0.9 %  sodium chloride infusion, , Intravenous, Continuous, Lollie Sails, MD  Prescriptions Prior to Admission  Medication Sig Dispense Refill Last Dose  . albuterol (PROVENTIL HFA;VENTOLIN HFA) 108 (90 BASE) MCG/ACT inhaler Inhale 2 puffs into the lungs every 6 (six) hours as needed for wheezing or shortness of breath. Reported on 06/09/2015   12/01/2015 at Unknown time  . ALPRAZolam (XANAX) 0.25 MG tablet Take 0.25 mg by mouth 2 (two) times daily as needed for anxiety or sleep.   12/01/2015 at Unknown time  . atenolol (TENORMIN) 50 MG tablet Take 50 mg by mouth daily.   12/02/2015 at 0720  . Calcium Carbonate-Vitamin D (CALTRATE 600+D PO) Take 1 tablet by mouth daily.   12/01/2015 at Unknown time  . eletriptan (RELPAX) 40 MG tablet Take 40 mg by mouth once. May repeat in 2 hours if headache persists or recurs.   Past Month at Unknown time  . pantoprazole (PROTONIX) 40 MG tablet Take 40 mg by mouth daily. Reported on 06/09/2015   12/01/2015 at Unknown time  . spironolactone (ALDACTONE) 50 MG tablet Take 50 mg by mouth daily.   12/02/2015 at 0720  . vitamin B-12 (CYANOCOBALAMIN) 1000 MCG tablet Take 1,000 mcg by mouth daily.   12/01/2015 at Unknown time  . vitamin E 1000 UNIT capsule Take 1,000 Units by mouth  daily.   12/01/2015 at Unknown time  . zolpidem (AMBIEN) 5 MG tablet Take 5 mg by mouth at bedtime as needed for sleep.   Past Week at Unknown time  . acetaminophen (TYLENOL) 325 MG tablet Take 650 mg by mouth every 4 (four) hours as needed.   Taking  . Cholecalciferol 1000 UNITS TBDP Take 1 tablet by mouth daily. Reported on 06/09/2015   Not Taking  . exemestane (AROMASIN) 25 MG tablet Take 25 mg by mouth daily after breakfast.   Not Taking at Unknown time  . hydroxychloroquine (PLAQUENIL) 200 MG tablet Take by mouth daily.   Not Taking at Unknown time  . Lifitegrast (XIIDRA) 5 % SOLN Apply 2 Drops/m2 to eye 2 (two) times daily.   Not Taking at Unknown time     Allergies  Allergen Reactions  . Erythromycin   . Latex   . Septra Ds [Sulfamethoxazole-Trimethoprim]   . Tetracyclines & Related   . Sulfa Antibiotics      Past Medical History:  Diagnosis Date  . COPD (chronic obstructive pulmonary disease) ([place])   . Deafness in left ear   . Depression   . Diverticulosis   . 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 Gastritis   . GERD (gastroesophageal reflux disease)   . H/O herpes labialis   . Hemorrhoids   . History of chicken pox   . Hyperplastic colon polyp   .  Hypertension   . Multinodular goiter (nontoxic)   . Osteoporosis   . Panic disorder   . Recurrent sinusitis   . Recurrent sinusitis   . Tobacco use   . Tubular adenoma of colon     Review of systems:      Physical Exam    Heart and lungs: Regular rate and rhythm without rub or gallop, lungs are bilaterally clear    HEENT: Normocephalic atraumatic eyes are anicteric    Other:     Pertinant exam for procedure: Soft positive tender to palpation in the epigastric region. There are no masses or rebound.    Planned proceedures: EGD and indicated procedures. I have discussed the risks benefits and complications of procedures to include not limited to bleeding, infection, perforation and the risk of sedation  and the patient wishes to proceed.    Lollie Sails, MD Gastroenterology 12/02/2015  10:14 AM

## 2015-12-02 NOTE — Anesthesia Preprocedure Evaluation (Signed)
Anesthesia Evaluation  Patient identified by MRN, date of birth, ID band Patient awake    Reviewed: Allergy & Precautions, NPO status , Patient's Chart, lab work & pertinent test results  Airway Mallampati: III       Dental  (+) Partial Upper, Partial Lower   Pulmonary COPD,  COPD inhaler, Current Smoker,    + rhonchi  + decreased breath sounds      Cardiovascular Exercise Tolerance: Good hypertension, Pt. on home beta blockers and Pt. on medications  Rhythm:Regular     Neuro/Psych Anxiety Depression    GI/Hepatic Neg liver ROS, GERD  Medicated,  Endo/Other  negative endocrine ROS  Renal/GU negative Renal ROS     Musculoskeletal   Abdominal Normal abdominal exam  (+) + scaphoid   Peds  Hematology negative hematology ROS (+)   Anesthesia Other Findings   Reproductive/Obstetrics                             Anesthesia Physical Anesthesia Plan  ASA: III  Anesthesia Plan: General   Post-op Pain Management:    Induction: Intravenous  Airway Management Planned: Natural Airway and Nasal Cannula  Additional Equipment:   Intra-op Plan:   Post-operative Plan:   Informed Consent: I have reviewed the patients History and Physical, chart, labs and discussed the procedure including the risks, benefits and alternatives for the proposed anesthesia with the patient or authorized representative who has indicated his/her understanding and acceptance.     Plan Discussed with:   Anesthesia Plan Comments:         Anesthesia Quick Evaluation

## 2015-12-02 NOTE — Transfer of Care (Signed)
Immediate Anesthesia Transfer of Care Note  Patient: Katie Franklin  Procedure(s) Performed: Procedure(s): ESOPHAGOGASTRODUODENOSCOPY (EGD) WITH PROPOFOL (N/A)  Patient Location: PACU  Anesthesia Type:General  Level of Consciousness: awake  Airway & Oxygen Therapy: Patient Spontanous Breathing and Patient connected to nasal cannula oxygen  Post-op Assessment: Report given to RN  Post vital signs: Reviewed  Last Vitals:  Vitals:   12/02/15 0831  BP: 117/77  Pulse: 64  Resp: 20  Temp: (!) 35.8 C    Last Pain:  Vitals:   12/02/15 0831  TempSrc: Tympanic         Complications: No apparent anesthesia complications

## 2015-12-03 ENCOUNTER — Encounter: Payer: Self-pay | Admitting: Gastroenterology

## 2015-12-06 LAB — SURGICAL PATHOLOGY

## 2016-02-03 ENCOUNTER — Other Ambulatory Visit: Payer: Self-pay | Admitting: Infectious Diseases

## 2016-02-03 DIAGNOSIS — Z853 Personal history of malignant neoplasm of breast: Secondary | ICD-10-CM

## 2016-03-15 ENCOUNTER — Ambulatory Visit
Admission: RE | Admit: 2016-03-15 | Discharge: 2016-03-15 | Disposition: A | Discharge status: Home / Self Care | Payer: Medicare Other | Source: Ambulatory Visit | Attending: Infectious Diseases | Admitting: Infectious Diseases

## 2016-03-15 DIAGNOSIS — Z853 Personal history of malignant neoplasm of breast: Secondary | ICD-10-CM

## 2016-03-15 HISTORY — DX: Malignant neoplasm of unspecified site of unspecified female breast: C50.919

## 2016-04-17 HISTORY — PX: BACK SURGERY: SHX140

## 2016-06-12 ENCOUNTER — Other Ambulatory Visit: Payer: Self-pay | Admitting: *Deleted

## 2016-06-12 DIAGNOSIS — Z853 Personal history of malignant neoplasm of breast: Secondary | ICD-10-CM

## 2016-06-14 ENCOUNTER — Inpatient Hospital Stay: Payer: Medicare Other

## 2016-06-14 ENCOUNTER — Inpatient Hospital Stay: Payer: Medicare Other | Admitting: Internal Medicine

## 2016-06-30 ENCOUNTER — Inpatient Hospital Stay: Payer: Medicare Other | Attending: Internal Medicine | Admitting: Internal Medicine

## 2016-06-30 ENCOUNTER — Inpatient Hospital Stay: Payer: Medicare Other

## 2016-06-30 DIAGNOSIS — K219 Gastro-esophageal reflux disease without esophagitis: Secondary | ICD-10-CM | POA: Diagnosis not present

## 2016-06-30 DIAGNOSIS — I10 Essential (primary) hypertension: Secondary | ICD-10-CM | POA: Diagnosis not present

## 2016-06-30 DIAGNOSIS — C50812 Malignant neoplasm of overlapping sites of left female breast: Secondary | ICD-10-CM | POA: Insufficient documentation

## 2016-06-30 DIAGNOSIS — J449 Chronic obstructive pulmonary disease, unspecified: Secondary | ICD-10-CM | POA: Diagnosis not present

## 2016-06-30 DIAGNOSIS — Z9012 Acquired absence of left breast and nipple: Secondary | ICD-10-CM | POA: Insufficient documentation

## 2016-06-30 DIAGNOSIS — Z853 Personal history of malignant neoplasm of breast: Secondary | ICD-10-CM

## 2016-06-30 DIAGNOSIS — F17210 Nicotine dependence, cigarettes, uncomplicated: Secondary | ICD-10-CM | POA: Diagnosis not present

## 2016-06-30 DIAGNOSIS — Z923 Personal history of irradiation: Secondary | ICD-10-CM | POA: Insufficient documentation

## 2016-06-30 DIAGNOSIS — Z170 Estrogen receptor positive status [ER+]: Secondary | ICD-10-CM | POA: Diagnosis not present

## 2016-06-30 DIAGNOSIS — F329 Major depressive disorder, single episode, unspecified: Secondary | ICD-10-CM | POA: Diagnosis not present

## 2016-06-30 DIAGNOSIS — Z79811 Long term (current) use of aromatase inhibitors: Secondary | ICD-10-CM | POA: Insufficient documentation

## 2016-06-30 LAB — CBC WITH DIFFERENTIAL/PLATELET
Basophils Absolute: 0 10*3/uL (ref 0–0.1)
Basophils Relative: 1 %
Eosinophils Absolute: 0.1 10*3/uL (ref 0–0.7)
Eosinophils Relative: 1 %
HEMATOCRIT: 41 % (ref 35.0–47.0)
HEMOGLOBIN: 14.2 g/dL (ref 12.0–16.0)
LYMPHS ABS: 2.5 10*3/uL (ref 1.0–3.6)
LYMPHS PCT: 36 %
MCH: 32.2 pg (ref 26.0–34.0)
MCHC: 34.7 g/dL (ref 32.0–36.0)
MCV: 92.8 fL (ref 80.0–100.0)
MONO ABS: 0.6 10*3/uL (ref 0.2–0.9)
MONOS PCT: 9 %
NEUTROS ABS: 3.7 10*3/uL (ref 1.4–6.5)
Neutrophils Relative %: 53 %
Platelets: 234 10*3/uL (ref 150–440)
RBC: 4.42 MIL/uL (ref 3.80–5.20)
RDW: 13.3 % (ref 11.5–14.5)
WBC: 6.8 10*3/uL (ref 3.6–11.0)

## 2016-06-30 LAB — COMPREHENSIVE METABOLIC PANEL
ALK PHOS: 38 U/L (ref 38–126)
ALT: 10 U/L — ABNORMAL LOW (ref 14–54)
ANION GAP: 6 (ref 5–15)
AST: 17 U/L (ref 15–41)
Albumin: 4.3 g/dL (ref 3.5–5.0)
BILIRUBIN TOTAL: 1 mg/dL (ref 0.3–1.2)
BUN: 11 mg/dL (ref 6–20)
CALCIUM: 9.2 mg/dL (ref 8.9–10.3)
CO2: 26 mmol/L (ref 22–32)
Chloride: 101 mmol/L (ref 101–111)
Creatinine, Ser: 0.87 mg/dL (ref 0.44–1.00)
GFR calc Af Amer: >60 mL/min (ref >60)
Glucose, Bld: 117 mg/dL — ABNORMAL HIGH (ref 65–99)
POTASSIUM: 3.9 mmol/L (ref 3.5–5.1)
Sodium: 133 mmol/L — ABNORMAL LOW (ref 135–145)
TOTAL PROTEIN: 7.6 g/dL (ref 6.5–8.1)

## 2016-06-30 NOTE — Progress Notes (Signed)
[facility] OFFICE PROGRESS NOTE  Patient Care Team: [name], MD as PCP - General (Infectious Diseases)   SUMMARY OF ONCOLOGIC HISTORY:  Oncology History   # 2012- LEFT BREAST [pt1b- STAGE I; G-2];ER/PR-Pos; her 2 Neu-NEG;  RS- 21 [risk of recurrence 13%];s/p Lumpec& SLNBx; NO chemo; s/p RT; On Aromasin  [finish April 2017]     Carcinoma of overlapping sites of left breast in female, estrogen receptor positive ([place])     INTERVAL HISTORY:  A pleasant 61 year old female patient with above history of stage I breast cancer ER/PR positive HER-2 negative- status post lumpectomy followed by radiation; no chemotherapy- is here a follow up.   Otherwise no nausea no vomiting. No lumps or bumps.Patient complains of cough; shortness of breath on exertion.   REVIEW OF SYSTEMS:  A complete 10 point review of system is done which is negative except mentioned above/history of present illness.   PAST MEDICAL HISTORY :  Past Medical History:  Diagnosis Date  . Breast cancer ([place]) 2012   left breast  . COPD (chronic obstructive pulmonary disease) ([place])   . Deafness in left ear   . Depression   . Diverticulosis   . 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 Gastritis   . GERD (gastroesophageal reflux disease)   . H/O herpes labialis   . Hemorrhoids   . History of chicken pox   . Hyperplastic colon polyp   . Hypertension   . Multinodular goiter (nontoxic)   . Osteoporosis   . Panic disorder   . Recurrent sinusitis   . Recurrent sinusitis   . Tobacco use   . Tubular adenoma of colon     PAST SURGICAL HISTORY :   Past Surgical History:  Procedure Laterality Date  . BREAST EXCISIONAL BIOPSY Left    negative 1980's twice  . BREAST EXCISIONAL BIOPSY Left    positive 2012  . COLONOSCOPY WITH PROPOFOL N/A 12/28/2014   Procedure: COLONOSCOPY WITH PROPOFOL;  Surgeon: Lollie Sails, MD;  Location: [facility] ENDOSCOPY;  Service: Endoscopy;  Laterality: N/A;  .  ESOPHAGOGASTRODUODENOSCOPY    . ESOPHAGOGASTRODUODENOSCOPY (EGD) WITH PROPOFOL N/A 12/02/2015   Procedure: ESOPHAGOGASTRODUODENOSCOPY (EGD) WITH PROPOFOL;  Surgeon: Lollie Sails, MD;  Location: [facility] ENDOSCOPY;  Service: Endoscopy;  Laterality: N/A;  . Incision tendon sheath for trigger finger Left   . MASTECTOMY, PARTIAL Left   . NASAL SEPTUM SURGERY    . TONSILLECTOMY      FAMILY HISTORY :   Family History  Problem Relation Age of Onset  . Breast cancer Maternal Aunt 90    great aunt  . Prostate cancer Father   . Hypertension Father   . Alzheimer's disease Mother   . Heart disease Mother   . Hypertension Mother   . Colon polyps Mother   . Depression Brother     brother #2    SOCIAL HISTORY:   Social History  Substance Use Topics  . Smoking status: Current Every Day Smoker    Packs/day: 0.50    Years: 40.00    Types: Cigarettes  . Smokeless tobacco: Never Used  . Alcohol use No    ALLERGIES:  is allergic to erythromycin; latex; septra ds [sulfamethoxazole-trimethoprim]; tetracyclines & related; and sulfa antibiotics.  MEDICATIONS:  Current Outpatient Prescriptions  Medication Sig Dispense Refill  . acetaminophen (TYLENOL) 325 MG tablet Take 650 mg by mouth every 4 (four) hours as needed.    Marland Kitchen albuterol (PROVENTIL HFA;VENTOLIN HFA) 108 (90  BASE) MCG/ACT inhaler Inhale 2 puffs into the lungs every 6 (six) hours as needed for wheezing or shortness of breath. Reported on 06/09/2015    . ALPRAZolam (XANAX) 0.25 MG tablet Take 0.25 mg by mouth 2 (two) times daily as needed for anxiety or sleep.    Marland Kitchen atenolol (TENORMIN) 50 MG tablet Take 50 mg by mouth daily.    . Calcium Carbonate-Vitamin D (CALTRATE 600+D PO) Take 1 tablet by mouth daily.    . Cholecalciferol 1000 UNITS TBDP Take 1 tablet by mouth daily. Reported on 06/09/2015    . eletriptan (RELPAX) 40 MG tablet Take 40 mg by mouth once. May repeat in 2 hours if headache persists or recurs.    Marland Kitchen Lifitegrast (XIIDRA) 5 %  SOLN Apply 2 Drops/m2 to eye 2 (two) times daily.    . pantoprazole (PROTONIX) 40 MG tablet Take 40 mg by mouth daily. Reported on 06/09/2015    . polyethylene glycol (MIRALAX / GLYCOLAX) packet Take by mouth.    . spironolactone (ALDACTONE) 50 MG tablet Take 50 mg by mouth daily.    . vitamin B-12 (CYANOCOBALAMIN) 1000 MCG tablet Take 1,000 mcg by mouth daily.    . vitamin E 1000 UNIT capsule Take 1,000 Units by mouth daily.    Marland Kitchen zolpidem (AMBIEN) 5 MG tablet Take 5 mg by mouth at bedtime as needed for sleep.     No current facility-administered medications for this visit.     PHYSICAL EXAMINATION: ECOG PERFORMANCE STATUS: 0 - Asymptomatic  BP 127/84 (BP Location: Right Arm, Patient Position: Sitting)   Pulse 70   Temp (!) 96.9 F (36.1 C) (Tympanic)   Resp 18   Wt 105 lb 8 oz (47.9 kg)   BMI 17.56 kg/m   Filed Weights   06/30/16 1505  Weight: 105 lb 8 oz (47.9 kg)    GENERAL: Well-nourished well-developed; Alert, no distress and comfortable.   Alone.  EYES: no pallor or icterus OROPHARYNX: no thrush or ulceration; poor dentition  NECK: supple, no masses felt LYMPH:  no palpable lymphadenopathy in the cervical, axillary or inguinal regions LUNGS: clear to auscultation and  No wheeze or crackles HEART/CVS: regular rate & rhythm and no murmurs; No lower extremity edema ABDOMEN:abdomen soft, non-tender and normal bowel sounds Musculoskeletal:no cyanosis of digits and no clubbing  PSYCH: alert & oriented x 3 with fluent speech NEURO: no focal motor/sensory deficits SKIN:  no rashes or significant lesions   LABORATORY DATA:  I have reviewed the data as listed    Component Value Date/Time   NA 133 (L) 06/30/2016 1420   NA 136 02/06/2012 0957   K 3.9 06/30/2016 1420   K 3.8 02/06/2012 0957   CL 101 06/30/2016 1420   CO2 26 06/30/2016 1420   GLUCOSE 117 (H) 06/30/2016 1420   BUN 11 06/30/2016 1420   CREATININE 0.87 06/30/2016 1420   CREATININE 0.97 11/12/2013 1508    CALCIUM 9.2 06/30/2016 1420   CALCIUM 9.5 05/08/2012 1018   PROT 7.6 06/30/2016 1420   PROT 7.6 11/12/2013 1508   ALBUMIN 4.3 06/30/2016 1420   ALBUMIN 3.7 11/12/2013 1508   AST 17 06/30/2016 1420   AST 10 (L) 11/12/2013 1508   ALT 10 (L) 06/30/2016 1420   ALT 15 11/12/2013 1508   ALKPHOS 38 06/30/2016 1420   ALKPHOS 49 11/12/2013 1508   BILITOT 1.0 06/30/2016 1420   BILITOT 0.7 11/12/2013 1508   GFRNONAA >60 06/30/2016 1420   GFRNONAA >60 11/12/2013 1508  GFRAA >60 06/30/2016 1420   GFRAA >60 11/12/2013 1508    No results found for: SPEP, UPEP  Lab Results  Component Value Date   WBC 6.8 06/30/2016   NEUTROABS 3.7 06/30/2016   HGB 14.2 06/30/2016   HCT 41.0 06/30/2016   MCV 92.8 06/30/2016   PLT 234 06/30/2016      Chemistry      Component Value Date/Time   NA 133 (L) 06/30/2016 1420   NA 136 02/06/2012 0957   K 3.9 06/30/2016 1420   K 3.8 02/06/2012 0957   CL 101 06/30/2016 1420   CO2 26 06/30/2016 1420   BUN 11 06/30/2016 1420   CREATININE 0.87 06/30/2016 1420   CREATININE 0.97 11/12/2013 1508      Component Value Date/Time   CALCIUM 9.2 06/30/2016 1420   CALCIUM 9.5 05/08/2012 1018   ALKPHOS 38 06/30/2016 1420   ALKPHOS 49 11/12/2013 1508   AST 17 06/30/2016 1420   AST 10 (L) 11/12/2013 1508   ALT 10 (L) 06/30/2016 1420   ALT 15 11/12/2013 1508   BILITOT 1.0 06/30/2016 1420   BILITOT 0.7 11/12/2013 1508         ASSESSMENT & PLAN:   Carcinoma of overlapping sites of left breast in female, estrogen receptor positive ([place]) # LEFT BREAST CANCER- STAGE I- ER/PR- Positive; Her 2 Neg- on aromasin. No evidence of recurrence.   # Hot flashes-? Worsened off aromasin; monitor for now.   # Smoking/discussed lung cancer screening- counseled pt regarding quitting smoking. Recommend lung cancer screening; will inform [employer].   # Mammogram in Nov 2018  # follow up in 12 months/labs.       Cammie Sickle, MD 07/01/2016 12:07 PM

## 2016-06-30 NOTE — Progress Notes (Signed)
Patient here today for follow up.  Patient states no new concerns today  

## 2016-06-30 NOTE — Assessment & Plan Note (Addendum)
#   LEFT BREAST CANCER- STAGE I- ER/PR- Positive; Her 2 Neg- on aromasin. No evidence of recurrence.   # Hot flashes-? Worsened off aromasin; monitor for now.   # Smoking/discussed lung cancer screening- counseled pt regarding quitting smoking. Recommend lung cancer screening; will inform [employer].   # Mammogram in Nov 2018  # follow up in 12 months/labs.

## 2016-07-24 DIAGNOSIS — M545 Low back pain, unspecified: Secondary | ICD-10-CM | POA: Insufficient documentation

## 2016-07-24 DIAGNOSIS — A048 Other specified bacterial intestinal infections: Secondary | ICD-10-CM | POA: Insufficient documentation

## 2016-07-24 DIAGNOSIS — G8929 Other chronic pain: Secondary | ICD-10-CM | POA: Insufficient documentation

## 2016-08-11 ENCOUNTER — Other Ambulatory Visit: Payer: Self-pay | Admitting: Physical Medicine and Rehabilitation

## 2016-08-11 DIAGNOSIS — M5416 Radiculopathy, lumbar region: Secondary | ICD-10-CM

## 2016-08-23 ENCOUNTER — Ambulatory Visit
Admission: RE | Admit: 2016-08-23 | Discharge: 2016-08-23 | Disposition: A | Discharge status: Home / Self Care | Payer: Medicare Other | Source: Ambulatory Visit | Attending: Physical Medicine and Rehabilitation | Admitting: Physical Medicine and Rehabilitation

## 2016-08-23 DIAGNOSIS — M48061 Spinal stenosis, lumbar region without neurogenic claudication: Secondary | ICD-10-CM | POA: Diagnosis not present

## 2016-08-23 DIAGNOSIS — M549 Dorsalgia, unspecified: Secondary | ICD-10-CM | POA: Diagnosis present

## 2016-08-23 DIAGNOSIS — M5127 Other intervertebral disc displacement, lumbosacral region: Secondary | ICD-10-CM | POA: Diagnosis not present

## 2016-08-23 DIAGNOSIS — M7138 Other bursal cyst, other site: Secondary | ICD-10-CM | POA: Insufficient documentation

## 2016-08-23 DIAGNOSIS — M5416 Radiculopathy, lumbar region: Secondary | ICD-10-CM

## 2016-10-30 ENCOUNTER — Encounter
Admission: RE | Admit: 2016-10-30 | Discharge: 2016-10-30 | Disposition: A | Discharge status: Home / Self Care | Payer: Medicare Other | Source: Ambulatory Visit | Attending: Neurosurgery | Admitting: Neurosurgery

## 2016-10-30 DIAGNOSIS — Z01810 Encounter for preprocedural cardiovascular examination: Secondary | ICD-10-CM | POA: Diagnosis not present

## 2016-10-30 DIAGNOSIS — M5416 Radiculopathy, lumbar region: Secondary | ICD-10-CM | POA: Diagnosis not present

## 2016-10-30 DIAGNOSIS — Z01818 Encounter for other preprocedural examination: Secondary | ICD-10-CM | POA: Diagnosis present

## 2016-10-30 DIAGNOSIS — J449 Chronic obstructive pulmonary disease, unspecified: Secondary | ICD-10-CM | POA: Diagnosis not present

## 2016-10-30 DIAGNOSIS — Z01812 Encounter for preprocedural laboratory examination: Secondary | ICD-10-CM | POA: Diagnosis present

## 2016-10-30 DIAGNOSIS — I10 Essential (primary) hypertension: Secondary | ICD-10-CM | POA: Diagnosis not present

## 2016-10-30 HISTORY — DX: Family history of other specified conditions: Z84.89

## 2016-10-30 LAB — SURGICAL PCR SCREEN
MRSA, PCR: NEGATIVE
Staphylococcus aureus: NEGATIVE

## 2016-10-30 NOTE — Patient Instructions (Signed)
Your procedure is scheduled on: Monday 11/06/16 Report to [address]. 2ND FLOOR MEDICAL MALL ENTRANCE. To find out your arrival time please call [phone] between 1PM - 3PM on Friday 11/03/16.  Remember: Instructions that are not followed completely may result in serious medical risk, up to and including death, or upon the discretion of your surgeon and anesthesiologist your surgery may need to be rescheduled.    __X__ 1. Do not eat food or drink liquids after midnight. No gum chewing or hard candies.     __X__ 2. No Alcohol for 24 hours before or after surgery.   ____ 3. Bring all medications with you on the day of surgery if instructed.    __X__ 4. Notify your doctor if there is any change in your medical condition     (cold, fever, infections).             ___X__5. No smoking within 24 hours of your surgery.     Do not wear jewelry, make-up, hairpins, clips or nail polish.  Do not wear lotions, powders, or perfumes.   Do not shave 48 hours prior to surgery. Men may shave face and neck.  Do not bring valuables to the hospital.    [facility] is not responsible for any belongings or valuables.               Contacts, dentures or bridgework may not be worn into surgery.  Leave your suitcase in the car. After surgery it may be brought to your room.  For patients admitted to the hospital, discharge time is determined by your                treatment team.   Patients discharged the day of surgery will not be allowed to drive home.   Please read over the following fact sheets that you were given:   MRSA Information   __X__ Take these medicines the morning of surgery with A SIP OF WATER:    1. ATENOLOL  2. GABAPENTIN  3. PANTOPRAZOLE  4.  5.  6.  ____ Fleet Enema (as directed)   __X__ Use CHG Soap as directed  __X__ Use inhalers on the day of surgery  ____ Stop metformin 2 days prior to surgery    ____ Take 1/2 of usual insulin dose the night before surgery and none on  the morning of surgery.   ____ Stop Coumadin/Plavix/aspirin on   __X__ Stop Anti-inflammatories such as Advil, Aleve, Ibuprofen, Motrin, Naproxen, Naprosyn, Goodies,powder, or aspirin products.  OK to take Tylenol.   __X__ Stop supplements until after surgery.  (VITAMIN E)  ____ Bring C-Pap to the hospital.

## 2016-11-01 NOTE — Pre-Procedure Instructions (Signed)
Faxed request to Dr. Lacinda Axon for pre-op orders.

## 2016-11-02 ENCOUNTER — Encounter
Admission: RE | Admit: 2016-11-02 | Discharge: 2016-11-02 | Disposition: A | Discharge status: Home / Self Care | Payer: Medicare Other | Source: Ambulatory Visit | Attending: Neurosurgery | Admitting: Neurosurgery

## 2016-11-02 ENCOUNTER — Ambulatory Visit
Admission: RE | Admit: 2016-11-02 | Discharge: 2016-11-02 | Disposition: A | Discharge status: Home / Self Care | Payer: Medicare Other | Source: Ambulatory Visit | Attending: Neurosurgery | Admitting: Neurosurgery

## 2016-11-02 DIAGNOSIS — Z01818 Encounter for other preprocedural examination: Secondary | ICD-10-CM | POA: Insufficient documentation

## 2016-11-02 DIAGNOSIS — I10 Essential (primary) hypertension: Secondary | ICD-10-CM | POA: Insufficient documentation

## 2016-11-02 DIAGNOSIS — Z01810 Encounter for preprocedural cardiovascular examination: Secondary | ICD-10-CM | POA: Insufficient documentation

## 2016-11-02 DIAGNOSIS — J449 Chronic obstructive pulmonary disease, unspecified: Secondary | ICD-10-CM | POA: Insufficient documentation

## 2016-11-02 DIAGNOSIS — Z01812 Encounter for preprocedural laboratory examination: Secondary | ICD-10-CM | POA: Insufficient documentation

## 2016-11-02 DIAGNOSIS — M5416 Radiculopathy, lumbar region: Secondary | ICD-10-CM | POA: Insufficient documentation

## 2016-11-02 LAB — BASIC METABOLIC PANEL
ANION GAP: 8 (ref 5–15)
BUN: 9 mg/dL (ref 6–20)
CALCIUM: 9.3 mg/dL (ref 8.9–10.3)
CO2: 26 mmol/L (ref 22–32)
Chloride: 101 mmol/L (ref 101–111)
Creatinine, Ser: 0.78 mg/dL (ref 0.44–1.00)
GFR calc Af Amer: >60 mL/min (ref >60)
Glucose, Bld: 68 mg/dL (ref 65–99)
Potassium: 3.5 mmol/L (ref 3.5–5.1)
Sodium: 135 mmol/L (ref 135–145)

## 2016-11-02 LAB — URINALYSIS, ROUTINE W REFLEX MICROSCOPIC
Bacteria, UA: NONE SEEN
Bilirubin Urine: NEGATIVE
Glucose, UA: NEGATIVE mg/dL
Ketones, ur: NEGATIVE mg/dL
Leukocytes, UA: NEGATIVE
Nitrite: NEGATIVE
PROTEIN: NEGATIVE mg/dL
Specific Gravity, Urine: 1.002 — ABNORMAL LOW (ref 1.005–1.030)
pH: 6 (ref 5.0–8.0)

## 2016-11-02 LAB — PROTIME-INR
INR: 1.14
Prothrombin Time: 14.7 seconds (ref 11.4–15.2)

## 2016-11-02 LAB — APTT: aPTT: 28 seconds (ref 24–36)

## 2016-11-02 LAB — CBC
HEMATOCRIT: 41.2 % (ref 35.0–47.0)
Hemoglobin: 14 g/dL (ref 12.0–16.0)
MCH: 32.1 pg (ref 26.0–34.0)
MCHC: 34 g/dL (ref 32.0–36.0)
MCV: 94.3 fL (ref 80.0–100.0)
Platelets: 214 10*3/uL (ref 150–440)
RBC: 4.37 MIL/uL (ref 3.80–5.20)
RDW: 13.8 % (ref 11.5–14.5)
WBC: 6.4 10*3/uL (ref 3.6–11.0)

## 2016-11-02 LAB — TYPE AND SCREEN
ABO/RH(D): O POS
ANTIBODY SCREEN: NEGATIVE

## 2016-11-06 ENCOUNTER — Ambulatory Visit: Payer: Medicare Other | Admitting: Anesthesiology

## 2016-11-06 ENCOUNTER — Ambulatory Visit
Admission: RE | Admit: 2016-11-06 | Discharge: 2016-11-06 | Disposition: A | Discharge status: Home / Self Care | Payer: Medicare Other | Source: Ambulatory Visit | Attending: Neurosurgery | Admitting: Neurosurgery

## 2016-11-06 ENCOUNTER — Ambulatory Visit: Payer: Medicare Other

## 2016-11-06 ENCOUNTER — Encounter
Admission: RE | Disposition: A | Discharge status: Home / Self Care | Payer: Self-pay | Source: Ambulatory Visit | Attending: Neurosurgery

## 2016-11-06 DIAGNOSIS — Z419 Encounter for procedure for purposes other than remedying health state, unspecified: Secondary | ICD-10-CM

## 2016-11-06 DIAGNOSIS — M48061 Spinal stenosis, lumbar region without neurogenic claudication: Secondary | ICD-10-CM | POA: Insufficient documentation

## 2016-11-06 DIAGNOSIS — I10 Essential (primary) hypertension: Secondary | ICD-10-CM | POA: Insufficient documentation

## 2016-11-06 DIAGNOSIS — M5416 Radiculopathy, lumbar region: Secondary | ICD-10-CM | POA: Insufficient documentation

## 2016-11-06 DIAGNOSIS — F17210 Nicotine dependence, cigarettes, uncomplicated: Secondary | ICD-10-CM | POA: Diagnosis not present

## 2016-11-06 DIAGNOSIS — M79604 Pain in right leg: Secondary | ICD-10-CM | POA: Diagnosis present

## 2016-11-06 DIAGNOSIS — J449 Chronic obstructive pulmonary disease, unspecified: Secondary | ICD-10-CM | POA: Insufficient documentation

## 2016-11-06 DIAGNOSIS — Z9012 Acquired absence of left breast and nipple: Secondary | ICD-10-CM | POA: Diagnosis not present

## 2016-11-06 DIAGNOSIS — M7138 Other bursal cyst, other site: Secondary | ICD-10-CM | POA: Insufficient documentation

## 2016-11-06 DIAGNOSIS — Z853 Personal history of malignant neoplasm of breast: Secondary | ICD-10-CM | POA: Diagnosis not present

## 2016-11-06 HISTORY — PX: LUMBAR LAMINECTOMY/DECOMPRESSION MICRODISCECTOMY: SHX5026

## 2016-11-06 LAB — ABO/RH: ABO/RH(D): O POS

## 2016-11-06 SURGERY — LUMBAR LAMINECTOMY/DECOMPRESSION MICRODISCECTOMY 1 LEVEL
Anesthesia: General | Laterality: Right

## 2016-11-06 MED ORDER — LIDOCAINE HCL (CARDIAC) 20 MG/ML IV SOLN
INTRAVENOUS | Status: DC | PRN
  Administered 2016-11-06: 40 mg via INTRAVENOUS

## 2016-11-06 MED ORDER — SODIUM CHLORIDE 0.9 % IR SOLN
Status: DC | PRN
  Administered 2016-11-06: 1000 mL

## 2016-11-06 MED ORDER — IBUPROFEN 200 MG PO TABS: 200.0000 mg | ORAL_TABLET | ORAL | 0 refills | Status: DC | PRN

## 2016-11-06 MED ORDER — GELATIN ABSORBABLE 12-7 MM EX MISC
CUTANEOUS | Status: AC
  Filled 2016-11-06: qty 1

## 2016-11-06 MED ORDER — GELATIN ABSORBABLE 12-7 MM EX MISC
CUTANEOUS | Status: DC | PRN
  Administered 2016-11-06: 1 via TOPICAL

## 2016-11-06 MED ORDER — PHENYLEPHRINE HCL 10 MG/ML IJ SOLN
INTRAMUSCULAR | Status: DC | PRN
  Administered 2016-11-06: 200 ug via INTRAVENOUS
  Administered 2016-11-06: 100 ug via INTRAVENOUS

## 2016-11-06 MED ORDER — SUGAMMADEX SODIUM 200 MG/2ML IV SOLN
INTRAVENOUS | Status: DC | PRN
  Administered 2016-11-06: 200 mg via INTRAVENOUS

## 2016-11-06 MED ORDER — SUCCINYLCHOLINE CHLORIDE 20 MG/ML IJ SOLN
INTRAMUSCULAR | Status: AC
  Filled 2016-11-06: qty 1

## 2016-11-06 MED ORDER — ONDANSETRON HCL 4 MG/2ML IJ SOLN
INTRAMUSCULAR | Status: DC | PRN
  Administered 2016-11-06: 5 mg via INTRAVENOUS

## 2016-11-06 MED ORDER — GLYCOPYRROLATE 0.2 MG/ML IJ SOLN
INTRAMUSCULAR | Status: DC | PRN
  Administered 2016-11-06: 0.2 mg via INTRAVENOUS

## 2016-11-06 MED ORDER — FENTANYL CITRATE (PF) 250 MCG/5ML IJ SOLN
INTRAMUSCULAR | Status: AC
  Filled 2016-11-06: qty 5

## 2016-11-06 MED ORDER — METHYLPREDNISOLONE ACETATE 40 MG/ML IJ SUSP
INTRAMUSCULAR | Status: DC | PRN
  Administered 2016-11-06: 40 mg

## 2016-11-06 MED ORDER — ROCURONIUM BROMIDE 50 MG/5ML IV SOLN
INTRAVENOUS | Status: AC
  Filled 2016-11-06: qty 1

## 2016-11-06 MED ORDER — KETOROLAC TROMETHAMINE 30 MG/ML IJ SOLN
INTRAMUSCULAR | Status: AC
  Filled 2016-11-06: qty 1

## 2016-11-06 MED ORDER — DEXAMETHASONE SODIUM PHOSPHATE 10 MG/ML IJ SOLN
INTRAMUSCULAR | Status: AC
  Filled 2016-11-06: qty 1

## 2016-11-06 MED ORDER — ROCURONIUM BROMIDE 100 MG/10ML IV SOLN
INTRAVENOUS | Status: DC | PRN
  Administered 2016-11-06: 30 mg via INTRAVENOUS

## 2016-11-06 MED ORDER — FENTANYL CITRATE (PF) 100 MCG/2ML IJ SOLN
25.0000 ug | INTRAMUSCULAR | Status: DC | PRN
  Administered 2016-11-06: 50 ug via INTRAVENOUS
  Administered 2016-11-06 (×2): 25 ug via INTRAVENOUS

## 2016-11-06 MED ORDER — BUPIVACAINE-EPINEPHRINE (PF) 0.5% -1:200000 IJ SOLN
INTRAMUSCULAR | Status: AC
  Filled 2016-11-06: qty 30

## 2016-11-06 MED ORDER — PROMETHAZINE HCL 25 MG/ML IJ SOLN: 6.2500 mg | INTRAMUSCULAR | Status: DC | PRN

## 2016-11-06 MED ORDER — FENTANYL CITRATE (PF) 100 MCG/2ML IJ SOLN
INTRAMUSCULAR | Status: AC
  Administered 2016-11-06: 25 ug via INTRAVENOUS
  Filled 2016-11-06: qty 2

## 2016-11-06 MED ORDER — LACTATED RINGERS IV SOLN
INTRAVENOUS | Status: DC
  Administered 2016-11-06 (×2): via INTRAVENOUS

## 2016-11-06 MED ORDER — ACETAMINOPHEN 10 MG/ML IV SOLN
INTRAVENOUS | Status: AC
  Filled 2016-11-06: qty 100

## 2016-11-06 MED ORDER — MIDAZOLAM HCL 2 MG/2ML IJ SOLN
INTRAMUSCULAR | Status: DC | PRN
  Administered 2016-11-06: 2 mg via INTRAVENOUS

## 2016-11-06 MED ORDER — CEFAZOLIN SODIUM-DEXTROSE 1-4 GM/50ML-% IV SOLN
1.0000 g | Freq: Once | INTRAVENOUS | Status: AC
  Administered 2016-11-06: 1 g via INTRAVENOUS

## 2016-11-06 MED ORDER — CEFAZOLIN SODIUM-DEXTROSE 1-4 GM/50ML-% IV SOLN
INTRAVENOUS | Status: AC
  Filled 2016-11-06: qty 50

## 2016-11-06 MED ORDER — FENTANYL CITRATE (PF) 100 MCG/2ML IJ SOLN
INTRAMUSCULAR | Status: DC | PRN
  Administered 2016-11-06 (×3): 50 ug via INTRAVENOUS

## 2016-11-06 MED ORDER — ACETAMINOPHEN 10 MG/ML IV SOLN
INTRAVENOUS | Status: DC | PRN
  Administered 2016-11-06: 1000 mg via INTRAVENOUS

## 2016-11-06 MED ORDER — PROPOFOL 10 MG/ML IV BOLUS
INTRAVENOUS | Status: AC
  Filled 2016-11-06: qty 20

## 2016-11-06 MED ORDER — ONDANSETRON HCL 4 MG/2ML IJ SOLN
INTRAMUSCULAR | Status: AC
  Filled 2016-11-06: qty 2

## 2016-11-06 MED ORDER — PROPOFOL 10 MG/ML IV BOLUS
INTRAVENOUS | Status: DC | PRN
  Administered 2016-11-06: 100 mg via INTRAVENOUS

## 2016-11-06 MED ORDER — OXYCODONE HCL 5 MG PO TABS
5.0000 mg | ORAL_TABLET | ORAL | Status: DC | PRN
  Administered 2016-11-06: 5 mg via ORAL

## 2016-11-06 MED ORDER — MIDAZOLAM HCL 2 MG/2ML IJ SOLN
INTRAMUSCULAR | Status: AC
  Filled 2016-11-06: qty 2

## 2016-11-06 MED ORDER — OXYCODONE HCL 5 MG PO TABS: 5.0000 mg | ORAL_TABLET | ORAL | 0 refills | Status: DC | PRN

## 2016-11-06 MED ORDER — OXYCODONE HCL 5 MG PO TABS
ORAL_TABLET | ORAL | Status: AC
  Filled 2016-11-06: qty 1

## 2016-11-06 MED ORDER — DEXAMETHASONE SODIUM PHOSPHATE 10 MG/ML IJ SOLN
INTRAMUSCULAR | Status: DC | PRN
  Administered 2016-11-06: 10 mg via INTRAVENOUS

## 2016-11-06 MED ORDER — FENTANYL CITRATE (PF) 100 MCG/2ML IJ SOLN
INTRAMUSCULAR | Status: AC
  Filled 2016-11-06: qty 2

## 2016-11-06 MED ORDER — SODIUM CHLORIDE 0.9 % IV SOLN
INTRAVENOUS | Status: DC | PRN
  Administered 2016-11-06: 20 ug/min via INTRAVENOUS

## 2016-11-06 MED ORDER — PHENYLEPHRINE HCL 10 MG/ML IJ SOLN: INTRAMUSCULAR | Status: DC | PRN

## 2016-11-06 MED ORDER — BUPIVACAINE-EPINEPHRINE (PF) 0.5% -1:200000 IJ SOLN
INTRAMUSCULAR | Status: DC | PRN
  Administered 2016-11-06: 5 mL

## 2016-11-06 MED ORDER — EPHEDRINE SULFATE 50 MG/ML IJ SOLN
INTRAMUSCULAR | Status: DC | PRN
  Administered 2016-11-06: 5 mg via INTRAVENOUS
  Administered 2016-11-06: 10 mg via INTRAVENOUS

## 2016-11-06 MED ORDER — KETOROLAC TROMETHAMINE 30 MG/ML IJ SOLN: INTRAMUSCULAR | Status: DC | PRN

## 2016-11-06 MED ORDER — LIDOCAINE HCL (PF) 2 % IJ SOLN
INTRAMUSCULAR | Status: AC
  Filled 2016-11-06: qty 2

## 2016-11-06 MED ORDER — SODIUM CHLORIDE FLUSH 0.9 % IV SOLN
INTRAVENOUS | Status: AC
  Filled 2016-11-06: qty 10

## 2016-11-06 MED ORDER — THROMBIN 5000 UNITS EX SOLR
CUTANEOUS | Status: DC | PRN
  Administered 2016-11-06: 5000 [IU] via TOPICAL

## 2016-11-06 MED ORDER — BACITRACIN 50000 UNITS IM SOLR
INTRAMUSCULAR | Status: AC
  Filled 2016-11-06: qty 1

## 2016-11-06 SURGICAL SUPPLY — 66 items
BAND RUBBER 3X1/6 TAN STRL (MISCELLANEOUS) IMPLANT
BLADE BOVIE TIP EXT 4 (BLADE) IMPLANT
BRUSH SCRUB EZ  4% CHG (MISCELLANEOUS) ×2
BRUSH SCRUB EZ 4% CHG (MISCELLANEOUS) ×1 IMPLANT
BUR NEURO DRILL SOFT 3.0X3.8M (BURR) ×3 IMPLANT
CANISTER SUCT 1200ML W/VALVE (MISCELLANEOUS) ×3 IMPLANT
CHLORAPREP W/TINT 26ML (MISCELLANEOUS) ×6 IMPLANT
CLOSURE WOUND 1/2 X4 (GAUZE/BANDAGES/DRESSINGS)
CNTNR SPEC 2.5X3XGRAD LEK (MISCELLANEOUS) ×1
CONT SPEC 4OZ STER OR WHT (MISCELLANEOUS) ×2
CONTAINER SPEC 2.5X3XGRAD LEK (MISCELLANEOUS) ×1 IMPLANT
COUNTER NEEDLE 20/40 LG (NEEDLE) ×3 IMPLANT
COVER LIGHT HANDLE STERIS (MISCELLANEOUS) ×6 IMPLANT
CUP MEDICINE 2OZ PLAST GRAD ST (MISCELLANEOUS) ×3 IMPLANT
DERMABOND ADVANCED (GAUZE/BANDAGES/DRESSINGS) ×2
DERMABOND ADVANCED .7 DNX12 (GAUZE/BANDAGES/DRESSINGS) ×1 IMPLANT
DRAPE C-ARM XRAY 36X54 (DRAPES) ×6 IMPLANT
DRAPE C-ARMOR (DRAPES) IMPLANT
DRAPE LAPAROTOMY 100X77 ABD (DRAPES) ×3 IMPLANT
DRAPE MICROSCOPE SPINE 48X150 (DRAPES) ×3 IMPLANT
DRAPE POUCH INSTRU U-SHP 10X18 (DRAPES) ×3 IMPLANT
DRAPE SURG 17X11 SM STRL (DRAPES) ×3 IMPLANT
DRAPE TABLE BACK 80X90 (DRAPES) IMPLANT
DRSG TEGADERM 4X4.75 (GAUZE/BANDAGES/DRESSINGS) IMPLANT
DRSG TELFA 4X3 1S NADH ST (GAUZE/BANDAGES/DRESSINGS) IMPLANT
DURASEAL APPLICATOR TIP (TIP) IMPLANT
DURASEAL SPINE SEALANT 3ML (MISCELLANEOUS) IMPLANT
ELECT CAUTERY BLADE TIP 2.5 (TIP) ×3
ELECT EZSTD 165MM 6.5IN (MISCELLANEOUS)
ELECT REM PT RETURN 9FT ADLT (ELECTROSURGICAL) ×3
ELECTRODE CAUTERY BLDE TIP 2.5 (TIP) ×1 IMPLANT
ELECTRODE EZSTD 165MM 6.5IN (MISCELLANEOUS) IMPLANT
ELECTRODE REM PT RTRN 9FT ADLT (ELECTROSURGICAL) ×1 IMPLANT
GLOVE BIOGEL PI IND STRL 8 (GLOVE) ×1 IMPLANT
GLOVE BIOGEL PI INDICATOR 8 (GLOVE) ×2
GLOVE SURG SYN 8.0 (GLOVE) ×6 IMPLANT
GOWN STRL REUS W/ TWL LRG LVL3 (GOWN DISPOSABLE) ×1 IMPLANT
GOWN STRL REUS W/ TWL XL LVL3 (GOWN DISPOSABLE) ×1 IMPLANT
GOWN STRL REUS W/TWL LRG LVL3 (GOWN DISPOSABLE) ×2
GOWN STRL REUS W/TWL XL LVL3 (GOWN DISPOSABLE) ×2
GRADUATE 1200CC STRL 31836 (MISCELLANEOUS) ×3 IMPLANT
KIT RM TURNOVER STRD PROC AR (KITS) ×3 IMPLANT
KIT WILSON FRAME (KITS) ×3 IMPLANT
MARKER SKIN DUAL TIP RULER LAB (MISCELLANEOUS) ×6 IMPLANT
NDL SAFETY ECLIPSE 18X1.5 (NEEDLE) ×1 IMPLANT
NEEDLE HYPO 18GX1.5 SHARP (NEEDLE) ×2
NEEDLE HYPO 22GX1.5 SAFETY (NEEDLE) ×3 IMPLANT
NS IRRIG 1000ML POUR BTL (IV SOLUTION) ×3 IMPLANT
PACK LAMINECTOMY NEURO (CUSTOM PROCEDURE TRAY) ×3 IMPLANT
PAD ARMBOARD 7.5X6 YLW CONV (MISCELLANEOUS) ×3 IMPLANT
SPOGE SURGIFLO 8M (HEMOSTASIS)
SPONGE SURGIFLO 8M (HEMOSTASIS) IMPLANT
STAPLER SKIN PROX 35W (STAPLE) IMPLANT
STRIP CLOSURE SKIN 1/2X4 (GAUZE/BANDAGES/DRESSINGS) IMPLANT
SUT NURALON 4 0 TR CR/8 (SUTURE) IMPLANT
SUT POLYSORB 2-0 5X18 GS-10 (SUTURE) ×9 IMPLANT
SUT VIC AB 0 CT1 18XCR BRD 8 (SUTURE) IMPLANT
SUT VIC AB 0 CT1 8-18 (SUTURE)
SUT VIC AB 3-0 SH 8-18 (SUTURE) IMPLANT
SYR 20CC LL (SYRINGE) ×3 IMPLANT
SYR 30ML LL (SYRINGE) ×6 IMPLANT
SYRINGE 10CC LL (SYRINGE) ×6 IMPLANT
TOWEL OR 17X26 4PK STRL BLUE (TOWEL DISPOSABLE) ×6 IMPLANT
TRAY FOLEY W/METER SILVER 16FR (SET/KITS/TRAYS/PACK) IMPLANT
TUBING CONNECTING 10 (TUBING) ×2 IMPLANT
TUBING CONNECTING 10' (TUBING) ×1

## 2016-11-06 NOTE — OR Nursing (Signed)
Feeling a dizzy

## 2016-11-06 NOTE — Anesthesia Preprocedure Evaluation (Signed)
Anesthesia Evaluation  Patient identified by MRN, date of birth, ID band Patient awake    Reviewed: Allergy & Precautions, H&P , NPO status , Patient's Chart, lab work & pertinent test results, reviewed documented beta blocker date and time   History of Anesthesia Complications Negative for: history of anesthetic complications  Airway Mallampati: I  TM Distance: >3 FB Neck ROM: full    Dental  (+) Partial Upper, Missing, Dental Advidsory Given   Pulmonary neg shortness of breath, neg sleep apnea, COPD,  COPD inhaler, neg recent URI, Current Smoker,           Cardiovascular Exercise Tolerance: Good hypertension, (-) angina(-) CAD, (-) Past MI, (-) Cardiac Stents and (-) CABG (-) dysrhythmias (-) Valvular Problems/Murmurs     Neuro/Psych PSYCHIATRIC DISORDERS (Depression and anxiety) negative neurological ROS     GI/Hepatic Neg liver ROS, GERD  ,  Endo/Other  negative endocrine ROS  Renal/GU negative Renal ROS  negative genitourinary   Musculoskeletal   Abdominal   Peds  Hematology negative hematology ROS (+)   Anesthesia Other Findings Past Medical History: 2012: Breast cancer ([place])     Comment:  left breast No date: COPD (chronic obstructive pulmonary disease) ([facility]) No date: Deafness in left ear No date: Depression No date: Diverticulosis 2012: Ductal carcinoma of left breast ([place]) No date: Family history of adverse reaction to anesthesia     Comment:  mother got sick No date: Gastritis No date: GERD (gastroesophageal reflux disease) No date: H/O herpes labialis No date: Hemorrhoids No date: History of chicken pox No date: Hyperplastic colon polyp No date: Hypertension No date: Multinodular goiter (nontoxic) No date: Osteoporosis No date: Panic disorder No date: Recurrent sinusitis No date: Recurrent sinusitis No date: Tobacco use No date: Tubular adenoma of colon   Reproductive/Obstetrics negative  OB ROS                             Anesthesia Physical Anesthesia Plan  ASA: III  Anesthesia Plan: General   Post-op Pain Management:    Induction: Intravenous  PONV Risk Score and Plan: 2 and Ondansetron and Dexamethasone  Airway Management Planned: Oral ETT  Additional Equipment:   Intra-op Plan:   Post-operative Plan: Extubation in OR  Informed Consent: I have reviewed the patients History and Physical, chart, labs and discussed the procedure including the risks, benefits and alternatives for the proposed anesthesia with the patient or authorized representative who has indicated his/her understanding and acceptance.   Dental Advisory Given  Plan Discussed with: Anesthesiologist, CRNA and Surgeon  Anesthesia Plan Comments:         Anesthesia Quick Evaluation

## 2016-11-06 NOTE — Anesthesia Postprocedure Evaluation (Signed)
Anesthesia Post Note  Patient: KAJUANA SHAREEF  Procedure(s) Performed: Procedure(s) (LRB): RIGHT L5/S1 HEMILAMINECTOMY AND CYST RESECTION L5/S1 (Right)  Patient location during evaluation: PACU Anesthesia Type: General Level of consciousness: awake and alert Pain management: pain level controlled Vital Signs Assessment: post-procedure vital signs reviewed and stable Respiratory status: spontaneous breathing, nonlabored ventilation, respiratory function stable and patient connected to nasal cannula oxygen Cardiovascular status: blood pressure returned to baseline and stable Postop Assessment: no signs of nausea or vomiting Anesthetic complications: no     Last Vitals:  Vitals:   11/06/16 1811 11/06/16 1823  BP: 108/70 118/79  Pulse: (!) 59 63  Resp: 17 (!) 24  Temp:      Last Pain:  Vitals:   11/06/16 1823  TempSrc:   PainSc: 5                  Precious Haws Monda Chastain

## 2016-11-06 NOTE — OR Nursing (Signed)
Wants to rest a little before receiving more  Pain medication  Also wants to wait before taking food or too much drink

## 2016-11-06 NOTE — Op Note (Signed)
Operative Note  SURGERY DATE:  11/06/2016  PRE-OP DIAGNOSIS:  Lumbar Stenosis with Lumbar Radiculopathy (m48.062)  POST-OP DIAGNOSIS:Post-Op Diagnosis Codes:  Lumbar Stenosis with Lumbar Radiculopathy (m48.062)  Procedure(s) with comments: Right L5/S1 Hemilaminectomy and Discectomy with Facetectomy and Foraminotomy, Removal of Synovial Cyst  SURGEON:  * Malen Gauze, MD       Marin Olp, PA Assistant  ANESTHESIA:General   OPERATIVE FINDINGS: Synovial Cyst at L5/S1, Severe Compression  OPERATIVE REPORT:   Indication: Ms. Freese to the clinic on 6/26with ongoing leg pain, numbness, and difficulty with ambulation. She had failed conservative management including PT, steroid injections, and prescription medications.   MRI revealed a right L5/S1 synovial cyst displacing the S1 nerve root  Dynamic x-rays showed no abnormal movement and hemilaminectomy and cyst removal was discussed to relieve symptoms. Therisks of surgery were explained to include hematoma, infection, damage to nerve roots, CSF leak, weakness, numbness, pain, need for future surgery including fusion, heart attack, and stroke. She elected to proceed with surgery for symptom relief.   Procedure The patient was brought to the OR after informed consent was obtained. She was given general anesthesia and intubated by the anesthesia service. Vascular access lines were placed.The patient was then placed prone on a Wilson frameensuring all pressure points were padded. A time-out was performed per protocol.   The patient was sterilely prepped and draped. Fluoroscopy confirmed L5/S1 interspace and incision was planned. The incision was instilled withlocal anesthetic with epinephrine. The skin was opened sharply and the dissection taken to the fascia. This was incised and cautery was used to dissect the subperiosteal plane to expose the spinous processes and lamina of L5 and S1 on the right out to the  medial edge of the facet.  Retractors were inserted and hemostasis was achieved. X-ray was used to confirm location.  Next, a matchstick drill bit was used to remove the L5 lamina centrally. The underlying ligament was freed and removed with combination of rongeurs. The decompression was taken caudal to the superior border of S1 and then the medial facet was removed. A medial facetectomy was performed with foraminotomy at the L5/S1 level. The cyst was debulked and the wall was seen to be adherent to the dura medially. The attachment to the facet was removed and the S1 nerve easily dissected free. Once the caudal and rostral border was easily defined, the wall was dissected from the dura with microdissection. This took a considerable amount of time to ensure no dural tear. There was a small piece left adherent to the dura but the thecal sac was fully expanded and no compression was seen on the nerve roots.   Hemostasis was obtained with Floseal and cautery. Depomedrol was placed along nerve root. The fascia was then closed using 0 vicryl followed by the subcutaneous and dermal layers with 2-0 vicryl  until the epidermis was well approximated. The skin was closed with Dermabond. A dressing was applied.  The patient was returned to supine position and extubated by the anesthesia service. The patient was then taken to the PACU for post-operative care where he was moving extremities symmetrically.   ESTIMATED BLOOD LOSS: 10 cc  SPECIMENS None  IMPLANT None   I performed the case in its entirety with assistance of PA, Corrie Mckusick, Casey  I

## 2016-11-06 NOTE — Anesthesia Procedure Notes (Signed)
Procedure Name: Intubation Date/Time: 11/06/2016 2:28 PM Performed by: Eben Burow Pre-anesthesia Checklist: Patient identified, Emergency Drugs available, Suction available, Patient being monitored and Timeout performed Patient Re-evaluated:Patient Re-evaluated prior to induction Oxygen Delivery Method: Circle system utilized Preoxygenation: Pre-oxygenation with 100% oxygen Induction Type: IV induction Ventilation: Mask ventilation without difficulty Laryngoscope Size: Miller and 2 Grade View: Grade I Tube type: Oral Tube size: 7.0 mm Number of attempts: 1 Airway Equipment and Method: Stylet Placement Confirmation: ETT inserted through vocal cords under direct vision,  positive ETCO2 and breath sounds checked- equal and bilateral Secured at: 20 cm Tube secured with: Tape Dental Injury: Teeth and Oropharynx as per pre-operative assessment

## 2016-11-06 NOTE — Transfer of Care (Signed)
Immediate Anesthesia Transfer of Care Note  Patient: Katie Franklin  Procedure(s) Performed: Procedure(s): RIGHT L5/S1 HEMILAMINECTOMY AND CYST RESECTION L5/S1 (Right)  Patient Location: PACU  Anesthesia Type:General  Level of Consciousness: sedated  Airway & Oxygen Therapy: Patient Spontanous Breathing and Patient connected to face mask oxygen  Post-op Assessment: Report given to RN and Post -op Vital signs reviewed and stable  Post vital signs: Reviewed and stable  Last Vitals:  Vitals:   11/06/16 1100 11/06/16 1645  BP: 116/65 106/62  Pulse: 69 81  Resp: 16 16  Temp: 36.6 C 36.5 C    Last Pain:  Vitals:   11/06/16 1100  TempSrc: Oral  PainSc: 8          Complications: No apparent anesthesia complications

## 2016-11-06 NOTE — OR Nursing (Signed)
Dr Lacinda Axon in to see pt    Right leg feeling better

## 2016-11-06 NOTE — H&P (Signed)
Katie Franklin is an 61 y.o. female.   Chief Complaint: Right leg pain  HPI:  Katie Franklin is here for evaluation of ongoing right leg pain that appears to travel down the posterior side of her leg down towards the foot.  She is also experiencing right anterior thigh pain and back pain that will travel across her back and travel up towards her thoracic area.  She has been dealing with this for years but it is worsened over the past year.  She did start gabapentin a few months ago and has not noted a significant amount of relief with this.  She is previously been to physical therapy but noticed no relief.  She does use heat therapy at home and feels this is beneficial.  She has a TENS unit but has not tried it yet.   Describes numbness in the distribution posterior leg and foot on the right.  She does note some toe numbness on the left.  She feels that her right leg is weaker than her left.  Back pain does worsen with motion.  She has a history of osteoporosis is a normal yearly treatment and recently had a shot.  She has a history of breast cancer but has been treated and is cancer free now for 6 years.  Past Medical History:  Diagnosis Date  . Breast cancer ([place]) 2012   left breast  . COPD (chronic obstructive pulmonary disease) ([place])   . Deafness in left ear   . Depression   . Diverticulosis   . 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 Family history of adverse reaction to anesthesia    mother got sick  . Gastritis   . GERD (gastroesophageal reflux disease)   . H/O herpes labialis   . Hemorrhoids   . History of chicken pox   . Hyperplastic colon polyp   . Hypertension   . Multinodular goiter (nontoxic)   . Osteoporosis   . Panic disorder   . Recurrent sinusitis   . Recurrent sinusitis   . Tobacco use   . Tubular adenoma of colon     Past Surgical History:  Procedure Laterality Date  . BREAST EXCISIONAL BIOPSY Left    negative 1980's twice  . BREAST EXCISIONAL BIOPSY Left     positive 2012  . COLONOSCOPY WITH PROPOFOL N/A 12/28/2014   Procedure: COLONOSCOPY WITH PROPOFOL;  Surgeon: Lollie Sails, MD;  Location: [facility] ENDOSCOPY;  Service: Endoscopy;  Laterality: N/A;  . ESOPHAGOGASTRODUODENOSCOPY    . ESOPHAGOGASTRODUODENOSCOPY (EGD) WITH PROPOFOL N/A 12/02/2015   Procedure: ESOPHAGOGASTRODUODENOSCOPY (EGD) WITH PROPOFOL;  Surgeon: Lollie Sails, MD;  Location[facility] ENDOSCOPY;  Service: Endoscopy;  Laterality: N/A;  . Incision tendon sheath for trigger finger Left   . MASTECTOMY, PARTIAL Left   . NASAL SEPTUM SURGERY    . TONSILLECTOMY      Family History  Problem Relation Age of Onset  . Breast cancer Maternal Aunt 90       great aunt  . Prostate cancer Father   . Hypertension Father   . Alzheimer's disease Mother   . Heart disease Mother   . Hypertension Mother   . Colon polyps Mother   . Depression Brother        brother #2   Social History:  reports that she has been smoking Cigarettes.  She has a 40.00 pack-year smoking history. She has never used smokeless tobacco. She reports that she does not drink alcohol or use drugs.  Allergies:  Allergies  Allergen Reactions  . Erythromycin Anaphylaxis and Rash  . Septra Ds [Sulfamethoxazole-Trimethoprim] Anaphylaxis    Neck swelling as well   . Sulfa Antibiotics Anaphylaxis  . Tetracyclines & Related Other (See Comments)    Unknown   . Latex Rash    No prescriptions prior to admission.    No results found for this or any previous visit (from the past 48 hour(s)). No results found.  ROS  General ROS: Negative Psychological ROS: Negative Ophthalmic ROS: Negative ENT ROS: Negative Hematological and Lymphatic ROS: Negative  Endocrine ROS: Negative Respiratory ROS: Negative Cardiovascular ROS: Negative Gastrointestinal ROS: Negative Genito-Urinary ROS: Negative Musculoskeletal ROS: Positive for back pain Neurological ROS: Positive for leg pain and numbness Dermatological ROS:  Negative  There were no vitals taken for this visit. Physical Exam  General appearance: Alert, cooperative, in no acute distress, thin female Head: Normocephalic, atraumatic Eyes: Normal, EOM intact Oropharynx: Moist without lesions Pulm: Clear to auscultation bilaterally CV: No murmurs, regular rate Back: There is no tenderness to midline her left paramedian area, tenderness to palpation over the right side Ext: No edema in LE bilaterally  Neurologic exam:  Mental status: alertness: alert,  affect: normal Speech: fluent and clear Motor: 5 out of 5 strength in left lower extremity, there is slight decrease in strength on the right but at least 4+ out of 5 throughout Sensory: Crease light touch of the right lateral thigh and leg Reflexes: 2+ and symmetric bilaterally for patella Coordination: intact finger to nose Gait: Slow antalgic, uses cane   Imaging:  MRI lumbar spine: There is a normal lordotic curvature with overall normal alignment.  The disc spaces appear healthy and prominent.  There is a disc herniation there is mild noted at L5-S1 more eccentric to the right.  There is a large synovial cyst noted on the right side at L5-S1 results in severe neuroforaminal and lateral recess stenosis.  There is no obvious listhesis.  There are no other areas of significant central or neuroforaminal stenosis noted  Assessment/Plan Plan is right L5/S1 hemilaminectomy and cyst resection for radiculopathy  Deetta Perla, MD 11/06/2016, 9:57 AM

## 2016-11-06 NOTE — Anesthesia Post-op Follow-up Note (Cosign Needed)
Anesthesia QCDR form completed.        

## 2016-11-07 ENCOUNTER — Encounter: Payer: Self-pay | Admitting: Neurosurgery

## 2016-12-12 ENCOUNTER — Other Ambulatory Visit: Payer: Self-pay | Admitting: Neurosurgery

## 2016-12-12 DIAGNOSIS — R109 Unspecified abdominal pain: Secondary | ICD-10-CM

## 2016-12-15 ENCOUNTER — Ambulatory Visit
Admission: RE | Admit: 2016-12-15 | Discharge: 2016-12-15 | Disposition: A | Discharge status: Home / Self Care | Payer: Medicare Other | Source: Ambulatory Visit | Attending: Neurosurgery | Admitting: Neurosurgery

## 2016-12-15 DIAGNOSIS — R109 Unspecified abdominal pain: Secondary | ICD-10-CM | POA: Insufficient documentation

## 2017-02-27 ENCOUNTER — Other Ambulatory Visit: Payer: Self-pay | Admitting: Gastroenterology

## 2017-02-27 DIAGNOSIS — R1310 Dysphagia, unspecified: Secondary | ICD-10-CM

## 2017-02-27 DIAGNOSIS — R1319 Other dysphagia: Secondary | ICD-10-CM

## 2017-03-02 ENCOUNTER — Ambulatory Visit
Admission: RE | Admit: 2017-03-02 | Discharge: 2017-03-02 | Disposition: A | Discharge status: Home / Self Care | Payer: Medicare Other | Source: Ambulatory Visit | Attending: Gastroenterology | Admitting: Gastroenterology

## 2017-03-02 DIAGNOSIS — R1310 Dysphagia, unspecified: Secondary | ICD-10-CM | POA: Insufficient documentation

## 2017-03-02 DIAGNOSIS — Q394 Esophageal web: Secondary | ICD-10-CM | POA: Diagnosis not present

## 2017-03-02 DIAGNOSIS — R1319 Other dysphagia: Secondary | ICD-10-CM

## 2017-03-16 ENCOUNTER — Other Ambulatory Visit: Payer: Self-pay | Admitting: Internal Medicine

## 2017-03-16 ENCOUNTER — Ambulatory Visit
Admission: RE | Admit: 2017-03-16 | Discharge: 2017-03-16 | Disposition: A | Discharge status: Home / Self Care | Payer: Medicare Other | Source: Ambulatory Visit | Attending: Internal Medicine | Admitting: Internal Medicine

## 2017-03-16 DIAGNOSIS — Z170 Estrogen receptor positive status [ER+]: Secondary | ICD-10-CM | POA: Diagnosis not present

## 2017-03-16 DIAGNOSIS — C50812 Malignant neoplasm of overlapping sites of left female breast: Secondary | ICD-10-CM | POA: Diagnosis present

## 2017-03-16 HISTORY — DX: Personal history of irradiation: Z92.3

## 2017-03-21 ENCOUNTER — Other Ambulatory Visit: Payer: Self-pay | Admitting: Unknown Physician Specialty

## 2017-03-21 DIAGNOSIS — M542 Cervicalgia: Secondary | ICD-10-CM

## 2017-03-28 ENCOUNTER — Ambulatory Visit
Admission: RE | Admit: 2017-03-28 | Discharge: 2017-03-28 | Disposition: A | Discharge status: Home / Self Care | Payer: Medicare Other | Source: Ambulatory Visit | Attending: Unknown Physician Specialty | Admitting: Unknown Physician Specialty

## 2017-03-28 DIAGNOSIS — M4802 Spinal stenosis, cervical region: Secondary | ICD-10-CM | POA: Diagnosis not present

## 2017-03-28 DIAGNOSIS — M542 Cervicalgia: Secondary | ICD-10-CM

## 2017-03-28 DIAGNOSIS — M50222 Other cervical disc displacement at C5-C6 level: Secondary | ICD-10-CM | POA: Insufficient documentation

## 2017-04-12 ENCOUNTER — Encounter: Payer: Self-pay | Admitting: *Deleted

## 2017-04-13 ENCOUNTER — Encounter
Admission: RE | Disposition: A | Discharge status: Home / Self Care | Payer: Self-pay | Source: Ambulatory Visit | Attending: Gastroenterology

## 2017-04-13 ENCOUNTER — Ambulatory Visit: Payer: Medicare Other | Admitting: Anesthesiology

## 2017-04-13 ENCOUNTER — Ambulatory Visit
Admission: RE | Admit: 2017-04-13 | Discharge: 2017-04-13 | Disposition: A | Discharge status: Home / Self Care | Payer: Medicare Other | Source: Ambulatory Visit | Attending: Gastroenterology | Admitting: Gastroenterology

## 2017-04-13 DIAGNOSIS — H9192 Unspecified hearing loss, left ear: Secondary | ICD-10-CM | POA: Insufficient documentation

## 2017-04-13 DIAGNOSIS — Z9889 Other specified postprocedural states: Secondary | ICD-10-CM | POA: Diagnosis not present

## 2017-04-13 DIAGNOSIS — K228 Other specified diseases of esophagus: Secondary | ICD-10-CM | POA: Diagnosis not present

## 2017-04-13 DIAGNOSIS — J449 Chronic obstructive pulmonary disease, unspecified: Secondary | ICD-10-CM | POA: Insufficient documentation

## 2017-04-13 DIAGNOSIS — Z882 Allergy status to sulfonamides status: Secondary | ICD-10-CM | POA: Insufficient documentation

## 2017-04-13 DIAGNOSIS — K449 Diaphragmatic hernia without obstruction or gangrene: Secondary | ICD-10-CM | POA: Diagnosis not present

## 2017-04-13 DIAGNOSIS — K210 Gastro-esophageal reflux disease with esophagitis: Secondary | ICD-10-CM | POA: Diagnosis not present

## 2017-04-13 DIAGNOSIS — M810 Age-related osteoporosis without current pathological fracture: Secondary | ICD-10-CM | POA: Diagnosis not present

## 2017-04-13 DIAGNOSIS — R1310 Dysphagia, unspecified: Secondary | ICD-10-CM | POA: Diagnosis not present

## 2017-04-13 DIAGNOSIS — F17210 Nicotine dependence, cigarettes, uncomplicated: Secondary | ICD-10-CM | POA: Insufficient documentation

## 2017-04-13 DIAGNOSIS — R1013 Epigastric pain: Secondary | ICD-10-CM | POA: Insufficient documentation

## 2017-04-13 DIAGNOSIS — F419 Anxiety disorder, unspecified: Secondary | ICD-10-CM | POA: Insufficient documentation

## 2017-04-13 DIAGNOSIS — Z8379 Family history of other diseases of the digestive system: Secondary | ICD-10-CM | POA: Insufficient documentation

## 2017-04-13 DIAGNOSIS — Z853 Personal history of malignant neoplasm of breast: Secondary | ICD-10-CM | POA: Diagnosis not present

## 2017-04-13 DIAGNOSIS — I10 Essential (primary) hypertension: Secondary | ICD-10-CM | POA: Diagnosis not present

## 2017-04-13 DIAGNOSIS — Z79899 Other long term (current) drug therapy: Secondary | ICD-10-CM | POA: Insufficient documentation

## 2017-04-13 DIAGNOSIS — Z8619 Personal history of other infectious and parasitic diseases: Secondary | ICD-10-CM | POA: Insufficient documentation

## 2017-04-13 DIAGNOSIS — Z8719 Personal history of other diseases of the digestive system: Secondary | ICD-10-CM | POA: Insufficient documentation

## 2017-04-13 DIAGNOSIS — Z91040 Latex allergy status: Secondary | ICD-10-CM | POA: Diagnosis not present

## 2017-04-13 DIAGNOSIS — Z881 Allergy status to other antibiotic agents status: Secondary | ICD-10-CM | POA: Diagnosis not present

## 2017-04-13 DIAGNOSIS — K2950 Unspecified chronic gastritis without bleeding: Secondary | ICD-10-CM | POA: Insufficient documentation

## 2017-04-13 DIAGNOSIS — Z9012 Acquired absence of left breast and nipple: Secondary | ICD-10-CM | POA: Insufficient documentation

## 2017-04-13 DIAGNOSIS — Z87892 Personal history of anaphylaxis: Secondary | ICD-10-CM | POA: Insufficient documentation

## 2017-04-13 DIAGNOSIS — Z86010 Personal history of colonic polyps: Secondary | ICD-10-CM | POA: Diagnosis not present

## 2017-04-13 HISTORY — PX: ESOPHAGOGASTRODUODENOSCOPY (EGD) WITH PROPOFOL: SHX5813

## 2017-04-13 SURGERY — ESOPHAGOGASTRODUODENOSCOPY (EGD) WITH PROPOFOL
Anesthesia: General

## 2017-04-13 MED ORDER — LIDOCAINE HCL (CARDIAC) 20 MG/ML IV SOLN
INTRAVENOUS | Status: DC | PRN
  Administered 2017-04-13: 40 mg via INTRAVENOUS

## 2017-04-13 MED ORDER — SODIUM CHLORIDE 0.9 % IV SOLN
INTRAVENOUS | Status: DC
  Administered 2017-04-13: 11:00:00 via INTRAVENOUS

## 2017-04-13 MED ORDER — FENTANYL CITRATE (PF) 100 MCG/2ML IJ SOLN
INTRAMUSCULAR | Status: DC | PRN
  Administered 2017-04-13: 25 ug via INTRAVENOUS

## 2017-04-13 MED ORDER — SODIUM CHLORIDE 0.9 % IV SOLN: INTRAVENOUS | Status: DC

## 2017-04-13 MED ORDER — PROPOFOL 500 MG/50ML IV EMUL
INTRAVENOUS | Status: AC
  Filled 2017-04-13: qty 50

## 2017-04-13 MED ORDER — GLYCOPYRROLATE 0.2 MG/ML IJ SOLN
INTRAMUSCULAR | Status: DC | PRN
  Administered 2017-04-13: 0.2 mg via INTRAVENOUS

## 2017-04-13 MED ORDER — PROPOFOL 500 MG/50ML IV EMUL
INTRAVENOUS | Status: DC | PRN
  Administered 2017-04-13: 140 ug/kg/min via INTRAVENOUS

## 2017-04-13 MED ORDER — FENTANYL CITRATE (PF) 100 MCG/2ML IJ SOLN
INTRAMUSCULAR | Status: AC
Start: 2017-04-13 — End: 2017-04-13
  Filled 2017-04-13: qty 2

## 2017-04-13 MED ORDER — PROPOFOL 10 MG/ML IV BOLUS
INTRAVENOUS | Status: DC | PRN
  Administered 2017-04-13: 60 mg via INTRAVENOUS

## 2017-04-13 NOTE — H&P (Signed)
Outpatient short stay form Pre-procedure 04/13/2017 10:28 AM Katie Sails MD  Primary Physician: Dr. Adrian Prows  Reason for visit:  EGD  History of present illness:  Patient is a 61 year old female presenting today as above. She has a history of recurrent Helicobacter pylori and was recently eradicated about 2 months ago. She has been feeling better her appetite has improved however continues to have some epigastric discomfort and pain. Palpation. There is no nausea or vomiting. She has not had a gallbladder evaluation. Is a family history of cholecystitis/cholelithiasis. She takes no aspirin or blood thinning agents.    Current Facility-Administered Medications:  .  0.9 %  sodium chloride infusion, , Intravenous, Continuous, Katie Sails, MD .  0.9 %  sodium chloride infusion, , Intravenous, Continuous, Katie Sails, MD  Medications Prior to Admission  Medication Sig Dispense Refill Last Dose  . acetaminophen (TYLENOL) 325 MG tablet Take 650 mg by mouth every 4 (four) hours as needed for mild pain.    04/12/2017 at Unknown time  . albuterol (PROVENTIL HFA;VENTOLIN HFA) 108 (90 BASE) MCG/ACT inhaler Inhale 2 puffs into the lungs every 6 (six) hours as needed for wheezing or shortness of breath. Reported on 06/09/2015   Past Month at Unknown time  . ALPRAZolam (XANAX) 0.25 MG tablet Take 0.25 mg by mouth 2 (two) times daily as needed for anxiety or sleep.   04/12/2017 at Unknown time  . atenolol (TENORMIN) 50 MG tablet Take 50 mg by mouth daily.   04/13/2017 at Unknown time  . Calcium Carbonate-Vitamin D (CALTRATE 600+D PO) Take 2 each by mouth daily. Chewable   04/12/2017 at Unknown time  . cholecalciferol (VITAMIN D) 1000 units tablet Take 1,000 Units by mouth daily.   04/12/2017 at Unknown time  . pantoprazole (PROTONIX) 40 MG tablet Take 40 mg by mouth daily. Reported on 06/09/2015   04/12/2017 at Unknown time  . polyethylene glycol (MIRALAX / GLYCOLAX) packet Take 17  g by mouth daily as needed for mild constipation.    Past Month at Unknown time  . spironolactone (ALDACTONE) 50 MG tablet Take 50 mg by mouth daily.   04/13/2017 at Unknown time  . vitamin B-12 (CYANOCOBALAMIN) 1000 MCG tablet Take 1,000 mcg by mouth daily.   04/12/2017 at Unknown time  . vitamin E 1000 UNIT capsule Take 1,000 Units by mouth daily.   04/12/2017 at Unknown time  . zolpidem (AMBIEN) 5 MG tablet Take 5 mg by mouth at bedtime as needed for sleep.   Past Month at Unknown time  . eletriptan (RELPAX) 40 MG tablet Take 40 mg by mouth every 2 (two) hours as needed for migraine. May repeat in 2 hours if headache persists or recurs.    Not Taking at Unknown time  . fluconazole (DIFLUCAN) 150 MG tablet Take 150 mg by mouth daily as needed. Yeast infection   Completed Course at Unknown time  . gabapentin (NEURONTIN) 300 MG capsule Take 300 mg by mouth 3 (three) times daily.   Not Taking at Unknown time  . Lifitegrast (XIIDRA) 5 % SOLN Place 2 Drops/m2 into both eyes 2 (two) times daily.    Past Week at Unknown time  . oxyCODONE (ROXICODONE) 5 MG immediate release tablet Take 1 tablet (5 mg total) by mouth every 4 (four) hours as needed for severe pain or breakthrough pain. (Patient not taking: Reported on 04/13/2017) 30 tablet 0 Not Taking at Unknown time     Allergies  Allergen Reactions  .  Erythromycin Anaphylaxis and Rash  . Septra Ds [Sulfamethoxazole-Trimethoprim] Anaphylaxis    Neck swelling as well   . Sulfa Antibiotics Anaphylaxis  . Tetracyclines & Related Other (See Comments)    Unknown   . Latex Rash     Past Medical History:  Diagnosis Date  . Breast cancer ([place]) 2012   left breast  . COPD (chronic obstructive pulmonary disease) ([place])   . Deafness in left ear   . Depression   . Diverticulosis   . 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 Family history of adverse reaction to anesthesia    mother got sick  . Gastritis   . GERD (gastroesophageal reflux disease)    . H/O herpes labialis   . Hemorrhoids   . History of chicken pox   . Hyperplastic colon polyp   . Hypertension   . Multinodular goiter (nontoxic)   . Osteoporosis   . Panic disorder   . Personal history of radiation therapy   . Recurrent sinusitis   . Recurrent sinusitis   . Tobacco use   . Tubular adenoma of colon     Review of systems:      Physical Exam    Heart and lungs: Regular rate and rhythm without rub or gallop, lungs are bilaterally clear.    HEENT: Normocephalic atraumatic eyes are anicteric    Other:     Pertinant exam for procedure: Soft nontender nondistended bowel sounds positive normoactive.    Planned proceedures: EGD and indicated procedures. I have discussed the risks benefits and complications of procedures to include not limited to bleeding, infection, perforation and the risk of sedation and the patient wishes to proceed.    Katie Sails, MD Gastroenterology 04/13/2017  10:28 AM

## 2017-04-13 NOTE — Anesthesia Preprocedure Evaluation (Signed)
Anesthesia Evaluation  Patient identified by MRN, date of birth, ID band Patient awake    Reviewed: Allergy & Precautions, H&P , NPO status , Patient's Chart, lab work & pertinent test results, reviewed documented beta blocker date and time   Airway Mallampati: II   Neck ROM: full    Dental  (+) Poor Dentition   Pulmonary neg pulmonary ROS, COPD, Current Smoker,    Pulmonary exam normal        Cardiovascular hypertension, negative cardio ROS Normal cardiovascular exam Rhythm:regular Rate:Normal     Neuro/Psych PSYCHIATRIC DISORDERS negative neurological ROS  negative psych ROS   GI/Hepatic negative GI ROS, Neg liver ROS, GERD  ,  Endo/Other  negative endocrine ROS  Renal/GU negative Renal ROS  negative genitourinary   Musculoskeletal   Abdominal   Peds  Hematology negative hematology ROS (+)   Anesthesia Other Findings Past Medical History: 2012: Breast cancer ([place])     Comment:  left breast No date: COPD (chronic obstructive pulmonary disease) ([facility]) No date: Deafness in left ear No date: Depression No date: Diverticulosis 2012: Ductal carcinoma of left breast ([place]) No date: Family history of adverse reaction to anesthesia     Comment:  mother got sick No date: Gastritis No date: GERD (gastroesophageal reflux disease) No date: H/O herpes labialis No date: Hemorrhoids No date: History of chicken pox No date: Hyperplastic colon polyp No date: Hypertension No date: Multinodular goiter (nontoxic) No date: Osteoporosis No date: Panic disorder No date: Personal history of radiation therapy No date: Recurrent sinusitis No date: Recurrent sinusitis No date: Tobacco use No date: Tubular adenoma of colon Past Surgical History: No date: BREAST EXCISIONAL BIOPSY; Left     Comment:  negative 1980's twice No date: BREAST EXCISIONAL BIOPSY; Left     Comment:  positive 2012 12/28/2014: COLONOSCOPY WITH PROPOFOL;  N/A     Comment:  Procedure: COLONOSCOPY WITH PROPOFOL;  Surgeon: Lollie Sails, MD;  Location: [facility] ENDOSCOPY;  Service:               Endoscopy;  Laterality: N/A; No date: ESOPHAGOGASTRODUODENOSCOPY 12/02/2015: ESOPHAGOGASTRODUODENOSCOPY (EGD) WITH PROPOFOL; N/A     Comment:  Procedure: ESOPHAGOGASTRODUODENOSCOPY (EGD) WITH               PROPOFOL;  Surgeon: Lollie Sails, MD;  Location:               [facility] ENDOSCOPY;  Service: Endoscopy;  Laterality: N/A; No date: Incision tendon sheath for trigger finger; Left 11/06/2016: LUMBAR LAMINECTOMY/DECOMPRESSION MICRODISCECTOMY; Right     Comment:  Procedure: RIGHT L5/S1 HEMILAMINECTOMY AND CYST               RESECTION L5/S1;  Surgeon: Deetta Perla, MD;  Location:               [facility] ORS;  Service: Neurosurgery;  Laterality: Right; No date: MASTECTOMY, PARTIAL; Left No date: NASAL SEPTUM SURGERY No date: TONSILLECTOMY   Reproductive/Obstetrics negative OB ROS                             Anesthesia Physical Anesthesia Plan  ASA: III  Anesthesia Plan: General   Post-op Pain Management:    Induction:   PONV Risk Score and Plan:   Airway Management Planned:   Additional Equipment:   Intra-op Plan:   Post-operative Plan:   Informed Consent:  I have reviewed the patients History and Physical, chart, labs and discussed the procedure including the risks, benefits and alternatives for the proposed anesthesia with the patient or authorized representative who has indicated his/her understanding and acceptance.   Dental Advisory Given  Plan Discussed with: CRNA  Anesthesia Plan Comments:         Anesthesia Quick Evaluation

## 2017-04-13 NOTE — Transfer of Care (Signed)
Immediate Anesthesia Transfer of Care Note  Patient: Katie Franklin  Procedure(s) Performed: ESOPHAGOGASTRODUODENOSCOPY (EGD) WITH PROPOFOL (N/A )  Patient Location: PACU  Anesthesia Type:General  Level of Consciousness: awake, alert  and oriented  Airway & Oxygen Therapy: Patient Spontanous Breathing and Patient connected to nasal cannula oxygen  Post-op Assessment: Report given to RN and Post -op Vital signs reviewed and stable  Post vital signs: Reviewed and stable  Last Vitals:  Vitals:   04/13/17 1032 04/13/17 1119  BP: 114/72 99/67  Pulse: 72 69  Resp: 20 20  Temp: (!) 36 C (!) 36.3 C  SpO2: 100%     Last Pain:  Vitals:   04/13/17 1032  TempSrc: Tympanic         Complications: No apparent anesthesia complications

## 2017-04-13 NOTE — Op Note (Signed)
[facility] Gastroenterology Patient Name: Katie Franklin Procedure Date: 04/13/2017 10:47 AM MRN: [id number] Account #: [id number] Date of Birth: Oct 12, 1955 Admit Type: Outpatient Age: 61 Room: [facility] ENDO ROOM 1 Gender: Female Note Status: Finalized Procedure:            Upper GI endoscopy Indications:          Dyspepsia, Dysphagia Providers:            Lollie Sails, MD Referring MD:         Adrian Prows (Referring MD) Medicines:            Monitored Anesthesia Care Complications:        No immediate complications. Procedure:            Pre-Anesthesia Assessment:                       - ASA Grade Assessment: III - A patient with severe                        systemic disease.                       After obtaining informed consent, the endoscope was                        passed under direct vision. Throughout the procedure,                        the patient's blood pressure, pulse, and oxygen                        saturations were monitored continuously. The Endoscope                        was introduced through the mouth, and advanced to the                        third part of duodenum. The upper GI endoscopy was                        accomplished without difficulty. The patient tolerated                        the procedure well. Findings:      The Z-line was variable. Biopsies were taken with a cold forceps for       histology.      The exam of the esophagus was otherwise normal.      Patchy minimal inflammation characterized by congestion (edema) and       erythema was found in the cardia and in the gastric body. Biopsies were       taken with a cold forceps for histology.      A small hiatal hernia was present.      The examined duodenum was normal. Biopsies were taken with a cold       forceps for histology. Impression:           - Z-line variable. Biopsied.                       - Gastritis. Biopsied.                       -  Small hiatal  hernia.                       - Normal examined duodenum. Biopsied. Recommendation:       - Continue present medications.                       - Await pathology results.                       - Telephone GI clinic for pathology results in 1 week. Procedure Code(s):    --- Professional ---                       [phone], Esophagogastroduodenoscopy, flexible, transoral;                        with biopsy, single or multiple Diagnosis Code(s):    --- Professional ---                       K22.8, Other specified diseases of esophagus                       K29.70, Gastritis, unspecified, without bleeding                       K44.9, Diaphragmatic hernia without obstruction or                        gangrene                       R10.13, Epigastric pain                       R13.10, Dysphagia, unspecified CPT copyright 2016 American Medical Association. All rights reserved. The codes documented in this report are preliminary and upon coder review may  be revised to meet current compliance requirements. Lollie Sails, MD 04/13/2017 11:18:31 AM This report has been signed electronically. Number of Addenda: 0 Note Initiated On: 04/13/2017 10:47 AM      [facility]

## 2017-04-13 NOTE — Anesthesia Post-op Follow-up Note (Signed)
Anesthesia QCDR form completed.        

## 2017-04-15 NOTE — Anesthesia Postprocedure Evaluation (Signed)
Anesthesia Post Note  Patient: Katie Franklin  Procedure(s) Performed: ESOPHAGOGASTRODUODENOSCOPY (EGD) WITH PROPOFOL (N/A )  Anesthesia Type: General     Last Vitals:  Vitals:   04/13/17 1119 04/13/17 1129  BP: 99/67 114/77  Pulse: 69 61  Resp: 20 17  Temp: (!) 36.3 C   SpO2: 100% 100%    Last Pain:  Vitals:   04/13/17 1032  TempSrc: Tympanic                 Molli Barrows

## 2017-04-16 ENCOUNTER — Encounter: Payer: Self-pay | Admitting: Gastroenterology

## 2017-04-18 LAB — SURGICAL PATHOLOGY

## 2017-04-23 ENCOUNTER — Other Ambulatory Visit: Payer: Self-pay | Admitting: Gastroenterology

## 2017-04-23 DIAGNOSIS — R1084 Generalized abdominal pain: Secondary | ICD-10-CM

## 2017-05-10 ENCOUNTER — Ambulatory Visit
Admission: RE | Admit: 2017-05-10 | Discharge: 2017-05-10 | Disposition: A | Discharge status: Home / Self Care | Payer: Medicare Other | Source: Ambulatory Visit | Attending: Gastroenterology | Admitting: Gastroenterology

## 2017-05-10 ENCOUNTER — Encounter
Admission: RE | Admit: 2017-05-10 | Discharge: 2017-05-10 | Disposition: A | Discharge status: Home / Self Care | Payer: Medicare Other | Source: Ambulatory Visit | Attending: Gastroenterology | Admitting: Gastroenterology

## 2017-05-10 DIAGNOSIS — R1084 Generalized abdominal pain: Secondary | ICD-10-CM | POA: Diagnosis not present

## 2017-05-10 DIAGNOSIS — K8020 Calculus of gallbladder without cholecystitis without obstruction: Secondary | ICD-10-CM | POA: Insufficient documentation

## 2017-05-10 MED ORDER — TECHNETIUM TC 99M MEBROFENIN IV KIT
5.3400 | PACK | Freq: Once | INTRAVENOUS | Status: AC | PRN
  Administered 2017-05-10: 5.34 via INTRAVENOUS

## 2017-06-01 ENCOUNTER — Ambulatory Visit: Payer: Self-pay | Admitting: General Surgery

## 2017-06-01 ENCOUNTER — Other Ambulatory Visit: Payer: Self-pay

## 2017-06-01 ENCOUNTER — Encounter
Admission: RE | Admit: 2017-06-01 | Discharge: 2017-06-01 | Disposition: A | Discharge status: Home / Self Care | Payer: Medicare Other | Source: Ambulatory Visit | Attending: General Surgery | Admitting: General Surgery

## 2017-06-01 DIAGNOSIS — Z01818 Encounter for other preprocedural examination: Secondary | ICD-10-CM | POA: Insufficient documentation

## 2017-06-01 NOTE — H&P (View-Only) (Signed)
PATIENT PROFILE: Katie Franklin is a 62 y.o. female who presents to the Clinic for consultation at the request of Dr. 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  PCP:  Angelena Form, MD  HISTORY OF PRESENT ILLNESS: Katie Franklin reports having multiple abdominal pain since many years ago. The patient refers most of her pain is localized to the epigastric area and right upper quadrant of the abdomen. The patient refers pain is associated to many types of food such as [place], hot sauces, fatty sometimes even lettuce. Patient tolerates vegetables and oven chicken. Patient has lost weight to this pain. Pain radiation is difficult to say since it does not look like the pain is radiating, it looks like patient has multiple areas of pain instead. Food aggravates the pain. Not eating and resting makes pain worse. Also PPI has improved pain. Patient denies recent nausea or vomiting.    PROBLEM LIST:         Problem List  Date Reviewed: 04/24/2017         Noted   Chronic midline low back pain without sciatica 07/24/2016   H. 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 Positive for weight loss and fatigue Allergy and Immunology ROS: negative for - hives  Hematological and Lymphatic ROS: negative for - bleeding problems or bruising, negative for palpable nodes Endocrine ROS: negative for - heat or cold intolerance, hair changes Respiratory ROS: negative for - cough, shortness of breath or wheezing Cardiovascular ROS: no chest pain. Positive for palpitations GI ROS: negative for nausea, vomiting. See HPI for other pertinent positives and negatives.  Musculoskeletal ROS: Positive for - joint swelling or muscle pain Neurological ROS: negative for - confusion, syncope Dermatological ROS: negative for pruritus and rash Psychiatric: negative for depression, difficulty sleeping and memory loss. Positive for anxiety  MEDICATIONS: CurrentMedications        Current Outpatient Medications  Medication Sig Dispense Refill  . acetaminophen (TYLENOL) 500 MG tablet Take 500 mg by mouth 2 (two) times daily.    Marland Kitchen ALPRAZolam (XANAX) 0.25 MG tablet TAKE 1 TABLET BY MOUTH TWICE DAILY AS NEEDED FOR ANXIETY 60 tablet 5  . atenolol (TENORMIN) 50 MG tablet TAKE 1 TABLET DAILY 90 tablet 1  . CALCIUM-VITAMIN D3 ORAL Take by mouth.    . cholecalciferol (CHOLECALCIFEROL) 1,000 unit tablet Take by mouth.    . cyanocobalamin (VITAMIN B12) 1000 MCG tablet Take 1,000 mcg by mouth once daily.    Marland Kitchen dicyclomine (BENTYL) 10 mg capsule Take 1 capsule (10 mg total) by mouth 4 (four) times daily before meals and nightly 60 capsule 1  . eletriptan (RELPAX) 40 MG tablet TAKE 1 TABLET (40 MG TOTAL) ONCE AS  NEEDED FOR MIGRAINE. IF HEADACHE RETURNS, MAY TAKE A SECOND DOSE AFTER 2 HOURS. 14 tablet 4  . lifitegrast (XIIDRA) 5 % ophthalmic solution Place 1 drop into both eyes 2 (two) times daily.    Marland Kitchen omeprazole (PRILOSEC) 40 MG DR capsule Take 1 capsule (40 mg total) by mouth 2 (two) times daily Take 15-20 mins before meals 60 capsule 11  . peppermint oil (IBGARD ORAL) Take 1  capsule by mouth 2 (two) times daily    . polyethylene glycol (MIRALAX) packet Take 17 g by mouth once daily. Mix in 4-8ounces of fluid prior to taking.    Marland Kitchen spironolactone (ALDACTONE) 50 MG tablet TAKE 1 TABLET DAILY 90 tablet 3  . sucralfate (CARAFATE) 100 mg/mL suspension Take 10 mLs (1 g total) by mouth as needed (every 6-8 hours) 1200 mL 1  . VENTOLIN HFA 90 mcg/actuation inhaler INHALE TWO PUFFS BY MOUTH EVERY 6 HOURS AS NEEDED FOR WHEEZING 18 g 5  . vitamin E 1000 UNIT capsule Take 1,000 Units by mouth once daily.    Marland Kitchen zolpidem (AMBIEN) 5 MG tablet TAKE 2 TABLETS BY MOUTH NIGHTLY AS NEEDED FOR SLEEP 30 tablet 5   No current facility-administered medications for this visit.       ALLERGIES: Sulfasalazine; Demeclocycline; Erythromycin; Latex; Septra ds [sulfamethoxazole-trimethoprim]; Sulfa (sulfonamide antibiotics); and Tetracycline  PAST MEDICAL HISTORY:     Past Medical History:  Diagnosis Date  . Breast cancer (CMS-HCC)    Status post lumpectomy, radiation, hormone therapy  . COPD (chronic obstructive pulmonary disease) (CMS-HCC) 07/22/2014  . Deafness in left ear   . Depression   . Diverticulosis 12/28/2014  . Gastritis   . GERD (gastroesophageal reflux disease)   . H. pylori infection 12/02/2015  . Herpes labialis   . History of chicken pox   . HTN (hypertension)   . Hyperplastic colon polyp 11/07/2011  . Internal hemorrhoids 12/28/2014  . Multinodular goiter (nontoxic)   . Osteoporosis    a. Foot and rib fractures.  b. S/p Fosamax.  c. S/p Forteo.  [facility])  . Panic disorder   . Recurrent sinusitis   . Tobacco use   . Tubular adenoma of colon 12/28/2014   10/27/2008    PAST SURGICAL HISTORY:      Past Surgical History:  Procedure Laterality Date  . back surgery    . BREAST BIOPSY Left 08/01/2010   Dr. Phoebe Perch; cancer 37mm  . BUNIONETTE EXCISION Right 2000   Dr. Albertine Patricia  . COLONOSCOPY  11/07/2011, 10/27/2008    Hyperplastic polyp/ PHx of Tubular adenoma  . COLONOSCOPY  12/28/2014   Tubular adenoma of colon/Repeat 25yrs/MUS  . Cysts removed from backside of tongue Left 2007   Dr. Anda Latina  . EGD  10/22/2012  . EGD  12/02/2015   H. pylori infection/Gastritis/Reflux esophagitis/No Repeat/MUS  . EGD  04/13/2017   GERD/Negative for H. Pylori/No Repeat/MUS  . EXCISION GANGLION CYST WRIST PRIMARY Right 2010   Dr. Margaretmary Eddy  . INCISION TENDON SHEATH FOR TRIGGER FINGER Left 2010   Dr. Margaretmary Eddy  . LEFT BREAST CYST REMOVED Left 1980   Dr. Jamal Collin  . LEFT BREAST CYST REMOVED Left 1980   Dr. Pat Patrick  . LEFT BREAST CYST REMOVED Left 1982   [facility]  . MASTECTOMY PARTIAL Left 07/2010   s/p radiation and chemo  . PARTIAL MASTECTOMY Left   . PARTIAL MASTECTOMY, REMOVE LEFT SENTINAL NODE Left 08/11/2010   Dr. Ursula Beath (invasive mammary carcinoma, lumpectomy with sentinel  lymph node sampling)  . Right L5-S1 hemilaminectomy  11/06/2016   Dr Deetta Perla  . SEPTOPLASTY  2004   Dr. Jeannie Fend Vault  . TONSILLECTOMY  1995   Dr. Margaretha Sheffield     FAMILY HISTORY:      Family History  Problem Relation Age of Onset  . Prostate cancer Father   . High blood pressure (Hypertension) Father   . Alzheimer's disease Mother   . Heart failure Mother        CHF  . High blood pressure (Hypertension) Mother   . Colon polyps Mother   . Depression Brother      SOCIAL HISTORY: Social History        Socioeconomic History  . Marital status: Single    Spouse name: Not on file  . Number of children: Not on file  . Years of education: Not on file  . Highest education level: Not on file  Social Needs  . Financial resource strain: Not on file  . Food insecurity - worry: Not on file  . Food insecurity - inability: Not on file  . Transportation needs - medical: Not on file  . Transportation needs - non-medical: Not on file  Occupational History  . Not on file  Tobacco Use  .  Smoking status: Current Every Day Smoker    Packs/day: 0.50    Types: Cigarettes  . Smokeless tobacco: Never Used  Substance and Sexual Activity  . Alcohol use: No    Alcohol/week: 0.0 oz  . Drug use: No  . Sexual activity: Defer  Other Topics Concern  . Not on file  Social History Narrative   Lives with partner   Tobacco 1ppd   Laid off in 2008 from [employer], now retired   Etoh  - none    PHYSICAL EXAM:    Vitals:   05/25/17 1040  BP: 114/72  Pulse: 70  Temp: 36.3 C (97.3 F)   Body mass index is 17.41 kg/m. Weight: 47.4 kg (104 lb 9.6 oz)   GENERAL: Alert, active, oriented x3  HEENT: Pupils equal reactive to light. Extraocular movements are intact. Sclera clear. Palpebral conjunctiva normal red color.Pharynx clear.  NECK: Supple with no palpable mass and no adenopathy.  LUNGS: Sound clear with no rales rhonchi or wheezes.  HEART: Regular rhythm S1 and S2 without murmur.  ABDOMEN: Soft and depressible, nontender with no palpable mass, no hepatomegaly. No scars  EXTREMITIES: Well-developed well-nourished symmetrical with no dependent edema.  NEUROLOGICAL: Awake alert oriented, facial expression symmetrical, moving all extremities.  REVIEW OF DATA: I have reviewed the following data today:      No visits with results within 3 Month(s) from this visit.  Latest known visit with results is:  Hospital Outpatient Visit on 01/01/2017  Component Date Value  . H pylori Ag, Stl - [facility]* 01/01/2017 Positive*    Images of the last Abdominal sonogram, HIDA scan and Abdominopelvic CT scan (2017) were reviewed. I was not able to appreciate the gallbladder on the CT scan done on 2017. The images of the sonogram shows an elongated structure that is reported as the gallbladder but definitively does not has the normal appearance of the gallbladder. HIDA scan also without a typical appearance of a normal gallbladder.   ASSESSMENT: Ms. Foote is a 62 y.o.  female presenting for consultation for adenomyomatosis of the gallbladder. Patient with multiple symptoms of abdominal pain some that can be associated with a biliary etiology and others not. Patient also with  suspected cholelithiasis and sludge. Patient does has symptoms that should benefit of cholecystectomy but I need to discuss with radiologist the studies to make sure that the gallbladder is in the normal anatomy site. 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  Patient may need MRCP for better anatomy deliniation before surgery if Radiologist agree.  If gallbladder is identified at the normal anatomy will proceed with cholecystectomy.    PLAN: 1.  Will coordinate for Laparoscopic Cholecystectomy around two weeks from now (38937) 2. Will discuss imaging studies with radiologist to discuss the normal anatomy of the gallbladder 3. CBC, CMP 4. Follow staff instruction regarding pre admission process.   Patient and her significant other verbalized understanding, all questions were answered, and were agreeable with the plan outlined above.   Herbert Pun, MD  Electronically signed by Herbert Pun, MD

## 2017-06-01 NOTE — Patient Instructions (Signed)
Your procedure is scheduled on: Mon. 06/11/17 Report to Day Surgery. To find out your arrival time please call [phone] between 1PM - 3PM on .Friday 06/08/17  Remember: Instructions that are not followed completely may result in serious medical risk, up to and including death, or upon the discretion of your surgeon and anesthesiologist your surgery may need to be rescheduled.     _X__ 1. Do not eat food after midnight the night before your procedure.                 No gum chewing or hard candies. You may drink clear liquids up to 2 hours                 before you are scheduled to arrive for your surgery- DO not drink clear                 liquids within 2 hours of the start of your surgery.                 Clear Liquids include:  water, apple juice without pulp, clear carbohydrate                 drink such as Clearfast of Gartorade, Black Coffee or Tea (Do not add                 anything to coffee or tea).  __X__2.  On the morning of surgery brush your teeth with toothpaste and water, you  may rinse your mouth with mouthwash if you wish.  Do not swallow  toothpaste of mouthwash.     _X__ 3.  No Alcohol for 24 hours before or after surgery.   _X__ 4.  Do Not Smoke or use e-cigarettes For 24 Hours Prior to Your Surgery.                 Do not use any chewable tobacco products for at least 6 hours prior to                 surgery.  ____  5.  Bring all medications with you on the day of surgery if instructed.   __x__  6.  Notify your doctor if there is any change in your medical condition      (cold, fever, infections).     Do not wear jewelry, make-up, hairpins, clips or nail polish. Do not wear lotions, powders, or perfumes. You may wear deodorant. Do not shave 48 hours prior to surgery. Men may shave face and neck. Do not bring valuables to the hospital.    [facility] is not responsible for any belongings or valuables.  Contacts, dentures or bridgework  may not be worn into surgery. Leave your suitcase in the car. After surgery it may be brought to your room. For patients admitted to the hospital, discharge time is determined by your treatment team.   Patients discharged the day of surgery will not be allowed to drive home.   Please read over the following fact sheets that you were given:    _x___ Take these medicines the morning of surgery with A SIP OF WATER:    1acetaminophen (TYLENOL) 500 MG tablet if needed.   2. ALPRAZolam (XANAX) 0.25 MG tablet if needed  3. atenolol (TENORMIN) 50 MG tablet  4.Lifitegrast (XIIDRA) 5 % SOLN  5.pantoprazole (PROTONIX) 40 MG tablet extra dose the night before and the morninf of surgery  6.sucralfate (CARAFATE) 1 GM/10ML suspension  ____ Fleet Enema (as directed)   _x___ Use CHG Soap as directed  _x___ Use inhalers on the day of surgeryalbuterol (PROVENTIL HFA;VENTOLIN HFA) 108 (90 BASE) MCG/ACT inhaler  ____ Stop metformin 2 days prior to surgery    ____ Take 1/2 of usual insulin dose the night before surgery. No insulin the morning          of surgery.   ____ Stop Coumadin/Plavix/aspirin on   ____ Stop Anti-inflammatories on    __x__ Stop supplements until after surgery.  2/18vitamin E 1000 UNIT capsule  ____ Bring C-Pap to the hospital.

## 2017-06-01 NOTE — H&P (Signed)
PATIENT PROFILE: Katie Franklin is a 62 y.o. 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  PCP:  Katie Form, MD  HISTORY OF PRESENT ILLNESS: Katie Franklin reports having multiple abdominal pain since many years ago. The patient refers most of her pain is localized to the epigastric area and right upper quadrant of the abdomen. The patient refers pain is associated to many types of food such as [place], hot sauces, fatty sometimes even lettuce. Patient tolerates vegetables and oven chicken. Patient has lost weight to this pain. Pain radiation is difficult to say since it does not look like the pain is radiating, it looks like patient has multiple areas of pain instead. Food aggravates the pain. Not eating and resting makes pain worse. Also PPI has improved pain. Patient denies recent nausea or vomiting.    PROBLEM LIST:         Problem List  Date Reviewed: 04/24/2017         Noted   Chronic midline low back pain without sciatica 07/24/2016   H. 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 Positive for weight loss and fatigue Allergy and Immunology ROS: negative for - hives  Hematological and Lymphatic ROS: negative for - bleeding problems or bruising, negative for palpable nodes Endocrine ROS: negative for - heat or cold intolerance, hair changes Respiratory ROS: negative for - cough, shortness of breath or wheezing Cardiovascular ROS: no chest pain. Positive for palpitations GI ROS: negative for nausea, vomiting. See HPI for other pertinent positives and negatives.  Musculoskeletal ROS: Positive for - joint swelling or muscle pain Neurological ROS: negative for - confusion, syncope Dermatological ROS: negative for pruritus and rash Psychiatric: negative for depression, difficulty sleeping and memory loss. Positive for anxiety  MEDICATIONS: CurrentMedications        Current Outpatient Medications  Medication Sig Dispense Refill  . acetaminophen (TYLENOL) 500 MG tablet Take 500 mg by mouth 2 (two) times daily.    Marland Kitchen ALPRAZolam (XANAX) 0.25 MG tablet TAKE 1 TABLET BY MOUTH TWICE DAILY AS NEEDED FOR ANXIETY 60 tablet 5  . atenolol (TENORMIN) 50 MG tablet TAKE 1 TABLET DAILY 90 tablet 1  . CALCIUM-VITAMIN D3 ORAL Take by mouth.    . cholecalciferol (CHOLECALCIFEROL) 1,000 unit tablet Take by mouth.    . cyanocobalamin (VITAMIN B12) 1000 MCG tablet Take 1,000 mcg by mouth once daily.    Marland Kitchen dicyclomine (BENTYL) 10 mg capsule Take 1 capsule (10 mg total) by mouth 4 (four) times daily before meals and nightly 60 capsule 1  . eletriptan (RELPAX) 40 MG tablet TAKE 1 TABLET (40 MG TOTAL) ONCE AS  NEEDED FOR MIGRAINE. IF HEADACHE RETURNS, MAY TAKE A SECOND DOSE AFTER 2 HOURS. 14 tablet 4  . lifitegrast (XIIDRA) 5 % ophthalmic solution Place 1 drop into both eyes 2 (two) times daily.    Marland Kitchen omeprazole (PRILOSEC) 40 MG DR capsule Take 1 capsule (40 mg total) by mouth 2 (two) times daily Take 15-20 mins before meals 60 capsule 11  . peppermint oil (IBGARD ORAL) Take 1  capsule by mouth 2 (two) times daily    . polyethylene glycol (MIRALAX) packet Take 17 g by mouth once daily. Mix in 4-8ounces of fluid prior to taking.    Marland Kitchen spironolactone (ALDACTONE) 50 MG tablet TAKE 1 TABLET DAILY 90 tablet 3  . sucralfate (CARAFATE) 100 mg/mL suspension Take 10 mLs (1 g total) by mouth as needed (every 6-8 hours) 1200 mL 1  . VENTOLIN HFA 90 mcg/actuation inhaler INHALE TWO PUFFS BY MOUTH EVERY 6 HOURS AS NEEDED FOR WHEEZING 18 g 5  . vitamin E 1000 UNIT capsule Take 1,000 Units by mouth once daily.    Marland Kitchen zolpidem (AMBIEN) 5 MG tablet TAKE 2 TABLETS BY MOUTH NIGHTLY AS NEEDED FOR SLEEP 30 tablet 5   No current facility-administered medications for this visit.       ALLERGIES: Sulfasalazine; Demeclocycline; Erythromycin; Latex; Septra ds [sulfamethoxazole-trimethoprim]; Sulfa (sulfonamide antibiotics); and Tetracycline  PAST MEDICAL HISTORY:     Past Medical History:  Diagnosis Date  . Breast cancer (CMS-HCC)    Status post lumpectomy, radiation, hormone therapy  . COPD (chronic obstructive pulmonary disease) (CMS-HCC) 07/22/2014  . Deafness in left ear   . Depression   . Diverticulosis 12/28/2014  . Gastritis   . GERD (gastroesophageal reflux disease)   . H. pylori infection 12/02/2015  . Herpes labialis   . History of chicken pox   . HTN (hypertension)   . Hyperplastic colon polyp 11/07/2011  . Internal hemorrhoids 12/28/2014  . Multinodular goiter (nontoxic)   . Osteoporosis    a. Foot and rib fractures.  b. S/p Fosamax.  c. S/p Forteo.  [facility])  . Panic disorder   . Recurrent sinusitis   . Tobacco use   . Tubular adenoma of colon 12/28/2014   10/27/2008    PAST SURGICAL HISTORY:      Past Surgical History:  Procedure Laterality Date  . back surgery    . BREAST BIOPSY Left 08/01/2010   Katie Franklin; cancer 19mm  . BUNIONETTE EXCISION Right 2000   Dr. Albertine Franklin  . COLONOSCOPY  11/07/2011, 10/27/2008    Hyperplastic polyp/ PHx of Tubular adenoma  . COLONOSCOPY  12/28/2014   Tubular adenoma of colon/Repeat 51yrs/MUS  . Cysts removed from backside of tongue Left 2007   Katie Franklin  . EGD  10/22/2012  . EGD  12/02/2015   H. pylori infection/Gastritis/Reflux esophagitis/No Repeat/MUS  . EGD  04/13/2017   GERD/Negative for H. Pylori/No Repeat/MUS  . EXCISION GANGLION CYST WRIST PRIMARY Right 2010   Dr. Margaretmary Eddy  . INCISION TENDON SHEATH FOR TRIGGER FINGER Left 2010   Dr. Margaretmary Eddy  . LEFT BREAST CYST REMOVED Left 1980   Dr. Jamal Collin  . LEFT BREAST CYST REMOVED Left 1980   Dr. Pat Patrick  . LEFT BREAST CYST REMOVED Left 1982   [facility]  . MASTECTOMY PARTIAL Left 07/2010   s/p radiation and chemo  . PARTIAL MASTECTOMY Left   . PARTIAL MASTECTOMY, REMOVE LEFT SENTINAL NODE Left 08/11/2010   Dr. Ursula Beath (invasive mammary carcinoma, lumpectomy with sentinel  lymph node sampling)  . Right L5-S1 hemilaminectomy  11/06/2016   Dr Deetta Perla  . SEPTOPLASTY  2004   Dr. Jeannie Fend Vault  . TONSILLECTOMY  1995   Dr. Margaretha Sheffield     FAMILY HISTORY:      Family History  Problem Relation Age of Onset  . Prostate cancer Father   . High blood pressure (Hypertension) Father   . Alzheimer's disease Mother   . Heart failure Mother        CHF  . High blood pressure (Hypertension) Mother   . Colon polyps Mother   . Depression Brother      SOCIAL HISTORY: Social History        Socioeconomic History  . Marital status: Single    Spouse name: Not on file  . Number of children: Not on file  . Years of education: Not on file  . Highest education level: Not on file  Social Needs  . Financial resource strain: Not on file  . Food insecurity - worry: Not on file  . Food insecurity - inability: Not on file  . Transportation needs - medical: Not on file  . Transportation needs - non-medical: Not on file  Occupational History  . Not on file  Tobacco Use  .  Smoking status: Current Every Day Smoker    Packs/day: 0.50    Types: Cigarettes  . Smokeless tobacco: Never Used  Substance and Sexual Activity  . Alcohol use: No    Alcohol/week: 0.0 oz  . Drug use: No  . Sexual activity: Defer  Other Topics Concern  . Not on file  Social History Narrative   Lives with partner   Tobacco 1ppd   Laid off in 2008 from [place], now retired   Etoh  - none    PHYSICAL EXAM:    Vitals:   05/25/17 1040  BP: 114/72  Pulse: 70  Temp: 36.3 C (97.3 F)   Body mass index is 17.41 kg/m. Weight: 47.4 kg (104 lb 9.6 oz)   GENERAL: Alert, active, oriented x3  HEENT: Pupils equal reactive to light. Extraocular movements are intact. Sclera clear. Palpebral conjunctiva normal red color.Pharynx clear.  NECK: Supple with no palpable mass and no adenopathy.  LUNGS: Sound clear with no rales rhonchi or wheezes.  HEART: Regular rhythm S1 and S2 without murmur.  ABDOMEN: Soft and depressible, nontender with no palpable mass, no hepatomegaly. No scars  EXTREMITIES: Well-developed well-nourished symmetrical with no dependent edema.  NEUROLOGICAL: Awake alert oriented, facial expression symmetrical, moving all extremities.  REVIEW OF DATA: I have reviewed the following data today:      No visits with results within 3 Month(s) from this visit.  Latest known visit with results is:  Hospital Outpatient Visit on 01/01/2017  Component Date Value  . H pylori Ag, Stl - [facility]* 01/01/2017 Positive*    Images of the last Abdominal sonogram, HIDA scan and Abdominopelvic CT scan (2017) were reviewed. I was not able to appreciate the gallbladder on the CT scan done on 2017. The images of the sonogram shows an elongated structure that is reported as the gallbladder but definitively does not has the normal appearance of the gallbladder. HIDA scan also without a typical appearance of a normal gallbladder.   ASSESSMENT: Ms. Oncale is a 62 y.o.  female presenting for consultation for adenomyomatosis of the gallbladder. Patient with multiple symptoms of abdominal pain some that can be associated with a biliary etiology and others not. Patient also with  suspected cholelithiasis and sludge. Patient does has symptoms that should benefit of cholecystectomy but I need to discuss with radiologist the studies to make sure that the gallbladder is in the normal anatomy site. 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  Patient may need MRCP for better anatomy deliniation before surgery if Radiologist agree.  If gallbladder is identified at the normal anatomy will proceed with cholecystectomy.    PLAN: 1.  Will coordinate for Laparoscopic Cholecystectomy around two weeks from now (02111) 2. Will discuss imaging studies with radiologist to discuss the normal anatomy of the gallbladder 3. CBC, CMP 4. Follow staff instruction regarding pre admission process.   Patient and her significant other verbalized understanding, all questions were answered, and were agreeable with the plan outlined above.   Herbert Pun, MD  Electronically signed by Herbert Pun, MD

## 2017-06-10 MED ORDER — CEFAZOLIN SODIUM-DEXTROSE 2-4 GM/100ML-% IV SOLN
2.0000 g | INTRAVENOUS | Status: AC
  Administered 2017-06-11: 2 g via INTRAVENOUS

## 2017-06-11 ENCOUNTER — Ambulatory Visit
Admission: RE | Admit: 2017-06-11 | Discharge: 2017-06-11 | Disposition: A | Discharge status: Home / Self Care | Payer: Medicare Other | Source: Ambulatory Visit | Attending: General Surgery | Admitting: General Surgery

## 2017-06-11 ENCOUNTER — Ambulatory Visit: Payer: Medicare Other | Admitting: Anesthesiology

## 2017-06-11 ENCOUNTER — Other Ambulatory Visit: Payer: Self-pay

## 2017-06-11 ENCOUNTER — Encounter
Admission: RE | Disposition: A | Discharge status: Home / Self Care | Payer: Self-pay | Source: Ambulatory Visit | Attending: General Surgery

## 2017-06-11 ENCOUNTER — Encounter: Payer: Self-pay | Admitting: *Deleted

## 2017-06-11 DIAGNOSIS — F329 Major depressive disorder, single episode, unspecified: Secondary | ICD-10-CM | POA: Diagnosis not present

## 2017-06-11 DIAGNOSIS — Z853 Personal history of malignant neoplasm of breast: Secondary | ICD-10-CM | POA: Diagnosis not present

## 2017-06-11 DIAGNOSIS — F17210 Nicotine dependence, cigarettes, uncomplicated: Secondary | ICD-10-CM | POA: Insufficient documentation

## 2017-06-11 DIAGNOSIS — Z79899 Other long term (current) drug therapy: Secondary | ICD-10-CM | POA: Diagnosis not present

## 2017-06-11 DIAGNOSIS — K8010 Calculus of gallbladder with chronic cholecystitis without obstruction: Secondary | ICD-10-CM | POA: Diagnosis present

## 2017-06-11 DIAGNOSIS — K219 Gastro-esophageal reflux disease without esophagitis: Secondary | ICD-10-CM | POA: Insufficient documentation

## 2017-06-11 DIAGNOSIS — Z882 Allergy status to sulfonamides status: Secondary | ICD-10-CM | POA: Insufficient documentation

## 2017-06-11 DIAGNOSIS — Z9012 Acquired absence of left breast and nipple: Secondary | ICD-10-CM | POA: Diagnosis not present

## 2017-06-11 DIAGNOSIS — I10 Essential (primary) hypertension: Secondary | ICD-10-CM | POA: Insufficient documentation

## 2017-06-11 DIAGNOSIS — Z881 Allergy status to other antibiotic agents status: Secondary | ICD-10-CM | POA: Diagnosis not present

## 2017-06-11 DIAGNOSIS — E0590 Thyrotoxicosis, unspecified without thyrotoxic crisis or storm: Secondary | ICD-10-CM | POA: Insufficient documentation

## 2017-06-11 DIAGNOSIS — J449 Chronic obstructive pulmonary disease, unspecified: Secondary | ICD-10-CM | POA: Insufficient documentation

## 2017-06-11 HISTORY — PX: CHOLECYSTECTOMY: SHX55

## 2017-06-11 SURGERY — LAPAROSCOPIC CHOLECYSTECTOMY
Anesthesia: General | Site: Abdomen | Wound class: Clean Contaminated

## 2017-06-11 MED ORDER — FENTANYL CITRATE (PF) 100 MCG/2ML IJ SOLN
INTRAMUSCULAR | Status: AC
  Filled 2017-06-11: qty 2

## 2017-06-11 MED ORDER — LIDOCAINE HCL (CARDIAC) 20 MG/ML IV SOLN
INTRAVENOUS | Status: DC | PRN
  Administered 2017-06-11: 100 mg via INTRAVENOUS

## 2017-06-11 MED ORDER — KETOROLAC TROMETHAMINE 30 MG/ML IJ SOLN
INTRAMUSCULAR | Status: DC | PRN
  Administered 2017-06-11: 30 mg via INTRAVENOUS

## 2017-06-11 MED ORDER — LACTATED RINGERS IV SOLN: INTRAVENOUS | Status: DC

## 2017-06-11 MED ORDER — MIDAZOLAM HCL 2 MG/2ML IJ SOLN
INTRAMUSCULAR | Status: AC
  Filled 2017-06-11: qty 2

## 2017-06-11 MED ORDER — MIDAZOLAM HCL 2 MG/2ML IJ SOLN
INTRAMUSCULAR | Status: DC | PRN
  Administered 2017-06-11: 2 mg via INTRAVENOUS

## 2017-06-11 MED ORDER — LACTATED RINGERS IV SOLN
INTRAVENOUS | Status: DC | PRN
  Administered 2017-06-11: 07:00:00 via INTRAVENOUS

## 2017-06-11 MED ORDER — FENTANYL CITRATE (PF) 100 MCG/2ML IJ SOLN
INTRAMUSCULAR | Status: DC | PRN
  Administered 2017-06-11: 100 ug via INTRAVENOUS

## 2017-06-11 MED ORDER — DEXAMETHASONE SODIUM PHOSPHATE 10 MG/ML IJ SOLN
INTRAMUSCULAR | Status: DC | PRN
Start: 2017-06-11 — End: 2017-06-11
  Administered 2017-06-11: 5 mg via INTRAVENOUS

## 2017-06-11 MED ORDER — ACETAMINOPHEN 10 MG/ML IV SOLN
INTRAVENOUS | Status: DC | PRN
  Administered 2017-06-11: 1000 mg via INTRAVENOUS

## 2017-06-11 MED ORDER — BUPIVACAINE-EPINEPHRINE 0.5% -1:200000 IJ SOLN
INTRAMUSCULAR | Status: DC | PRN
  Administered 2017-06-11: 8 mL

## 2017-06-11 MED ORDER — ONDANSETRON HCL 4 MG/2ML IJ SOLN: 4.0000 mg | Freq: Once | INTRAMUSCULAR | Status: DC | PRN

## 2017-06-11 MED ORDER — PROPOFOL 10 MG/ML IV BOLUS
INTRAVENOUS | Status: DC | PRN
  Administered 2017-06-11: 140 mg via INTRAVENOUS

## 2017-06-11 MED ORDER — LIDOCAINE HCL (PF) 2 % IJ SOLN
INTRAMUSCULAR | Status: AC
  Filled 2017-06-11: qty 10

## 2017-06-11 MED ORDER — HYDROCODONE-ACETAMINOPHEN 5-325 MG PO TABS
1.0000 | ORAL_TABLET | Freq: Four times a day (QID) | ORAL | Status: DC | PRN
  Administered 2017-06-11: 1 via ORAL

## 2017-06-11 MED ORDER — BUPIVACAINE-EPINEPHRINE (PF) 0.5% -1:200000 IJ SOLN
INTRAMUSCULAR | Status: AC
  Filled 2017-06-11: qty 30

## 2017-06-11 MED ORDER — FENTANYL CITRATE (PF) 100 MCG/2ML IJ SOLN: 25.0000 ug | INTRAMUSCULAR | Status: DC | PRN

## 2017-06-11 MED ORDER — ROCURONIUM BROMIDE 50 MG/5ML IV SOLN
INTRAVENOUS | Status: AC
  Filled 2017-06-11: qty 1

## 2017-06-11 MED ORDER — HYDROMORPHONE HCL 1 MG/ML IJ SOLN
INTRAMUSCULAR | Status: AC
  Filled 2017-06-11: qty 1

## 2017-06-11 MED ORDER — CEFAZOLIN SODIUM-DEXTROSE 2-4 GM/100ML-% IV SOLN
INTRAVENOUS | Status: AC
  Filled 2017-06-11: qty 100

## 2017-06-11 MED ORDER — HYDROMORPHONE HCL 1 MG/ML IJ SOLN
INTRAMUSCULAR | Status: DC | PRN
  Administered 2017-06-11: 0.5 mg via INTRAVENOUS

## 2017-06-11 MED ORDER — ONDANSETRON HCL 4 MG/2ML IJ SOLN
INTRAMUSCULAR | Status: DC | PRN
  Administered 2017-06-11: 4 mg via INTRAVENOUS

## 2017-06-11 MED ORDER — SUGAMMADEX SODIUM 200 MG/2ML IV SOLN
INTRAVENOUS | Status: DC | PRN
  Administered 2017-06-11: 200 mg via INTRAVENOUS

## 2017-06-11 MED ORDER — ROCURONIUM BROMIDE 100 MG/10ML IV SOLN
INTRAVENOUS | Status: DC | PRN
  Administered 2017-06-11: 50 mg via INTRAVENOUS

## 2017-06-11 MED ORDER — PROPOFOL 10 MG/ML IV BOLUS
INTRAVENOUS | Status: AC
  Filled 2017-06-11: qty 20

## 2017-06-11 MED ORDER — HYDROCODONE-ACETAMINOPHEN 5-325 MG PO TABS
ORAL_TABLET | ORAL | Status: AC
  Filled 2017-06-11: qty 1

## 2017-06-11 MED ORDER — PHENYLEPHRINE HCL 10 MG/ML IJ SOLN
INTRAMUSCULAR | Status: DC | PRN
  Administered 2017-06-11 (×2): 100 ug via INTRAVENOUS

## 2017-06-11 MED ORDER — SUCCINYLCHOLINE CHLORIDE 20 MG/ML IJ SOLN
INTRAMUSCULAR | Status: AC
  Filled 2017-06-11: qty 1

## 2017-06-11 MED ORDER — HYDROCODONE-ACETAMINOPHEN 5-325 MG PO TABS: 1.0000 | ORAL_TABLET | ORAL | 0 refills | Status: AC | PRN

## 2017-06-11 SURGICAL SUPPLY — 43 items
APPLIER CLIP LOGIC TI 5 (MISCELLANEOUS) ×3 IMPLANT
BLADE SURG SZ11 CARB STEEL (BLADE) ×3 IMPLANT
CANISTER SUCT 1200ML W/VALVE (MISCELLANEOUS) ×3 IMPLANT
CHLORAPREP W/TINT 26ML (MISCELLANEOUS) ×3 IMPLANT
DERMABOND ADVANCED (GAUZE/BANDAGES/DRESSINGS) ×2
DERMABOND ADVANCED .7 DNX12 (GAUZE/BANDAGES/DRESSINGS) ×1 IMPLANT
DEVICE PMI PUNCTURE CLOSURE (MISCELLANEOUS) ×3 IMPLANT
DRAPE SHEET LG 3/4 BI-LAMINATE (DRAPES) ×3 IMPLANT
ELECT E-Z MONOPOLAR 33 (MISCELLANEOUS) ×3
ELECT REM PT RETURN 9FT ADLT (ELECTROSURGICAL) ×3
ELECTRODE E-Z MONOPOLAR 33 (MISCELLANEOUS) ×1 IMPLANT
ELECTRODE REM PT RTRN 9FT ADLT (ELECTROSURGICAL) ×1 IMPLANT
GAUZE SPONGE 4X4 12PLY STRL (GAUZE/BANDAGES/DRESSINGS) IMPLANT
GLOVE BIO SURGEON STRL SZ 6.5 (GLOVE) IMPLANT
GLOVE BIO SURGEONS STRL SZ 6.5 (GLOVE)
GLOVE BIOGEL PI IND STRL 7.0 (GLOVE) ×1 IMPLANT
GLOVE BIOGEL PI INDICATOR 7.0 (GLOVE) ×2
GLOVE SKINSENSE NS SZ6.5 (GLOVE) ×8
GLOVE SKINSENSE STRL SZ6.5 (GLOVE) ×4 IMPLANT
GOWN STRL REUS W/ TWL LRG LVL3 (GOWN DISPOSABLE) ×3 IMPLANT
GOWN STRL REUS W/TWL LRG LVL3 (GOWN DISPOSABLE) ×6
GRASPER SUT TROCAR 14GX15 (MISCELLANEOUS) IMPLANT
HEMOSTAT SURGICEL 2X3 (HEMOSTASIS) IMPLANT
IRRIGATION STRYKERFLOW (MISCELLANEOUS) ×1 IMPLANT
IRRIGATOR STRYKERFLOW (MISCELLANEOUS) ×3
IV NS 1000ML (IV SOLUTION) ×2
IV NS 1000ML BAXH (IV SOLUTION) ×1 IMPLANT
KIT TURNOVER KIT A (KITS) ×3 IMPLANT
LABEL OR SOLS (LABEL) ×3 IMPLANT
NEEDLE HYPO 25X1 1.5 SAFETY (NEEDLE) ×3 IMPLANT
NEEDLE INSUFFLATION 14GA 120MM (NEEDLE) ×3 IMPLANT
NS IRRIG 500ML POUR BTL (IV SOLUTION) ×3 IMPLANT
PACK LAP CHOLECYSTECTOMY (MISCELLANEOUS) ×3 IMPLANT
PENCIL ELECTRO HAND CTR (MISCELLANEOUS) IMPLANT
POUCH SPECIMEN RETRIEVAL 10MM (ENDOMECHANICALS) ×3 IMPLANT
SCISSORS METZENBAUM CVD 33 (INSTRUMENTS) ×3 IMPLANT
SLEEVE ENDOPATH XCEL 5M (ENDOMECHANICALS) ×6 IMPLANT
SUT MNCRL AB 4-0 PS2 18 (SUTURE) ×3 IMPLANT
SUT VIC AB 0 CT1 36 (SUTURE) IMPLANT
SUT VICRYL 0 AB UR-6 (SUTURE) ×3 IMPLANT
TROCAR XCEL NON-BLD 11X100MML (ENDOMECHANICALS) ×3 IMPLANT
TROCAR XCEL NON-BLD 5MMX100MML (ENDOMECHANICALS) ×3 IMPLANT
TUBING INSUFFLATION (TUBING) ×3 IMPLANT

## 2017-06-11 NOTE — Anesthesia Preprocedure Evaluation (Signed)
Anesthesia Evaluation  Patient identified by MRN, date of birth, ID band Patient awake    Reviewed: Allergy & Precautions, NPO status , Patient's Chart, lab work & pertinent test results  History of Anesthesia Complications (+) Family history of anesthesia reactionHistory of anesthetic complications: mother with PONV.  Airway Mallampati: II       Dental  (+) Partial Upper   Pulmonary neg sleep apnea, COPD,  COPD inhaler, Current Smoker,           Cardiovascular hypertension, Pt. on medications (-) Past MI and (-) CHF + dysrhythmias (occassional tachycardia) (-) Valvular Problems/Murmurs     Neuro/Psych neg Seizures Anxiety Depression    GI/Hepatic Neg liver ROS, GERD  Medicated and Controlled,  Endo/Other  neg diabetes  Renal/GU negative Renal ROS     Musculoskeletal   Abdominal   Peds  Hematology   Anesthesia Other Findings   Reproductive/Obstetrics                             Anesthesia Physical Anesthesia Plan  ASA: III  Anesthesia Plan: General   Post-op Pain Management:    Induction: Intravenous  PONV Risk Score and Plan: 3 and Dexamethasone, Ondansetron and Midazolam  Airway Management Planned: Oral ETT  Additional Equipment:   Intra-op Plan:   Post-operative Plan:   Informed Consent: I have reviewed the patients History and Physical, chart, labs and discussed the procedure including the risks, benefits and alternatives for the proposed anesthesia with the patient or authorized representative who has indicated his/her understanding and acceptance.     Plan Discussed with:   Anesthesia Plan Comments:         Anesthesia Quick Evaluation

## 2017-06-11 NOTE — Progress Notes (Signed)
Chaplain offered silent prayer and presence.

## 2017-06-11 NOTE — Anesthesia Postprocedure Evaluation (Signed)
Anesthesia Post Note  Patient: Katie Franklin  Procedure(s) Performed: LAPAROSCOPIC CHOLECYSTECTOMY (N/A Abdomen)  Patient location during evaluation: PACU Anesthesia Type: General Level of consciousness: awake and alert Pain management: pain level controlled Vital Signs Assessment: post-procedure vital signs reviewed and stable Respiratory status: spontaneous breathing and respiratory function stable Cardiovascular status: stable Anesthetic complications: no     Last Vitals:  Vitals:   06/11/17 0603 06/11/17 0851  BP: 135/80 (!) 84/41  Pulse: 60 (!) 56  Resp: 16 19  Temp: (!) 36.3 C (!) 36 C  SpO2: 100% 98%    Last Pain:  Vitals:   06/11/17 0851  TempSrc:   PainSc: Asleep                 KEPHART,WILLIAM K

## 2017-06-11 NOTE — Op Note (Signed)
Preoperative diagnosis: Symptomatic cholelithiasis.  Postoperative diagnosis: Symptomatic cholelithiasis..  Procedure: Laparoscopic Cholecystectomy.   Anesthesia: GETA   Surgeon: Dr. Windell Moment  Wound Classification: Clean Contaminated  Indications: Patient is a 62 y.o. female developed right upper quadrant pain and nausea. Patient has been eating less due to the pain that is associated with food intake. On workup was found to have cholelithiasis with a normal common duct. Patient with malrotation with normal liver anatomy as per CT scan and abdominal ultrasound. Laparoscopic cholecystectomy was elected.  Findings: Critical view of safety achieved Cystic duct and artery identified, ligated and divided Duodenum does to the right upper quadrant instead of forming the duodenal C loop. No large intestine seen on the right side of the abdomen.  Adequate hemostasis  Description of procedure: The patient was placed on the operating table in the supine position. General anesthesia was induced. A time-out was completed verifying correct patient, procedure, site, positioning, and implant(s) and/or special equipment prior to beginning this procedure. An orogastric tube was placed. The abdomen was prepped and draped in the usual sterile fashion.  An incision was made in a natural skin line below the umbilicus.  The fascia was elevated and the Veress needle inserted. Proper position was confirmed by aspiration and saline meniscus test.  The abdomen was insufflated with carbon dioxide to a pressure of 15 mmHg. The patient tolerated insufflation well. A 11-mm trocar was then inserted.  The laparoscope was inserted and the abdomen inspected. Finding of malrotation as mentioned above.  No injuries from initial trocar placement were noted. Additional trocars were then inserted in the following locations: a 5-mm trocar in the right epigastrium and two 5-mm trocars along the right costal margin. The abdomen  was inspected and no abnormalities were found. The table was placed in the reverse Trendelenburg position with the right side up.  Filmy adhesions between the gallbladder and omentum, duodenum and transverse colon were lysed sharply. The dome of the gallbladder was grasped with an atraumatic grasper passed through the lateral port and retracted over the dome of the liver. The infundibulum was also grasped with an atraumatic grasper through the midclavicular port and retracted toward the right lower quadrant. This maneuver exposed Calot's triangle. The peritoneum overlying the gallbladder infundibulum was then incised and the cystic duct and cystic artery identified and circumferentially dissected. The critical view of safety was achieved identifying the liver inside the Callot triangle. A time out was done before the cystic duct and artery were ligated. The cystic duct and cystic artery were then doubly clipped and divided close to the gallbladder.  The gallbladder was then dissected from its peritoneal attachments by electrocautery. Hemostasis was checked and the gallbladder and contained stones were removed using an endoscopic retrieval bag placed through the umbilical port. The gallbladder was passed off the table as a specimen. The gallbladder fossa was copiously irrigated with saline and hemostasis was obtained. There was no evidence of bleeding from the gallbladder fossa or cystic artery or leakage of the bile from the cystic duct stump. Secondary trocars were removed under direct vision. No bleeding was noted. The laparoscope was withdrawn and the umbilical trocar removed. The abdomen was allowed to collapse. The fascia of the 54mm trocar sites was closed with figure-of-eight 0 vicryl sutures. The skin was closed with subcuticular sutures of 4-0 monocryl and topical skin adhesive. The orogastric tube was removed.  The patient tolerated the procedure well and was taken to the postanesthesia care unit in  stable  condition.   Specimen: Gallbladder  Complications: None  EBL: 51mL

## 2017-06-11 NOTE — Anesthesia Post-op Follow-up Note (Signed)
Anesthesia QCDR form completed.        

## 2017-06-11 NOTE — Discharge Instructions (Signed)
°  Diet: Resume home heart healthy regular diet.   Activity: No heavy lifting >20 pounds (children, pets, laundry, garbage) or strenuous activity until follow-up, but light activity and walking are encouraged. Do not drive or drink alcohol if taking narcotic pain medications.  Wound care: May shower with soapy water and pat dry (do not rub incisions), but no baths or submerging incision underwater until follow-up. (no swimming)   Medications: Resume all home medications. For mild to moderate pain: acetaminophen (Tylenol) or ibuprofen (if no kidney disease). Combining Tylenol with alcohol can substantially increase your risk of causing liver disease. Narcotic pain medications, if prescribed, can be used for severe pain, though may cause nausea, constipation, and drowsiness. Do not combine Tylenol and Percocet within a 6 hour period as Percocet contains Tylenol. If you do not need the narcotic pain medication, you do not need to fill the prescription.  If having symptoms of constipation needs to use a stool softener and/or laxative as needed. (e.g. Miralax, Dulcolax)  Call office [phone]) at any time if any questions, worsening pain, fevers/chills, bleeding, drainage from incision site, or other concerns.    AMBULATORY SURGERY  DISCHARGE INSTRUCTIONS   1) The drugs that you were given will stay in your system until tomorrow so for the next 24 hours you should not:  A) Drive an automobile B) Make any legal decisions C) Drink any alcoholic beverage   2) You may resume regular meals tomorrow.  Today it is better to start with liquids and gradually work up to solid foods.  You may eat anything you prefer, but it is better to start with liquids, then soup and crackers, and gradually work up to solid foods.   3) Please notify your doctor immediately if you have any unusual bleeding, trouble breathing, redness and pain at the surgery site, drainage, fever, or pain not relieved by  medication.    4) Additional Instructions:        Please contact your physician with any problems or Same Day Surgery at [phone], Monday through Friday 6 am to 4 pm, or [place] [facility] number at [phone].

## 2017-06-11 NOTE — Interval H&P Note (Signed)
History and Physical Interval Note:  06/11/2017 7:06 AM  Katie Franklin  has presented today for surgery, with the diagnosis of cholelithiasis  The various methods of treatment have been discussed with the patient and family. After consideration of risks, benefits and other options for treatment, the patient has consented to  Procedure(s): LAPAROSCOPIC CHOLECYSTECTOMY (N/A) as a surgical intervention .  The patient's history has been reviewed, patient examined, no change in status, stable for surgery.  I have reviewed the patient's chart and labs.  Questions were answered to the patient's satisfaction.     Herbert Pun

## 2017-06-11 NOTE — Anesthesia Procedure Notes (Signed)
Procedure Name: Intubation Date/Time: 06/11/2017 7:34 AM Performed by: Justus Memory, CRNA Pre-anesthesia Checklist: Patient identified, Patient being monitored, Timeout performed, Emergency Drugs available and Suction available Patient Re-evaluated:Patient Re-evaluated prior to induction Oxygen Delivery Method: Circle system utilized Preoxygenation: Pre-oxygenation with 100% oxygen Induction Type: IV induction Ventilation: Mask ventilation without difficulty Laryngoscope Size: Mac and 3 Grade View: Grade I Tube type: Oral Tube size: 7.0 mm Number of attempts: 1 Airway Equipment and Method: Stylet Placement Confirmation: ETT inserted through vocal cords under direct vision,  positive ETCO2 and breath sounds checked- equal and bilateral Secured at: 21 cm Tube secured with: Tape Dental Injury: Teeth and Oropharynx as per pre-operative assessment

## 2017-06-11 NOTE — OR Nursing (Signed)
Discharge instructions discussed with pt and family. Both voice understanding.

## 2017-06-11 NOTE — Transfer of Care (Signed)
Immediate Anesthesia Transfer of Care Note  Patient: JOEL MERICLE  Procedure(s) Performed: LAPAROSCOPIC CHOLECYSTECTOMY (N/A Abdomen)  Patient Location: PACU  Anesthesia Type:General  Level of Consciousness: sedated  Airway & Oxygen Therapy: Patient Spontanous Breathing and Patient connected to face mask oxygen  Post-op Assessment: Report given to RN and Post -op Vital signs reviewed and stable  Post vital signs: Reviewed and stable  Last Vitals:  Vitals:   06/11/17 0603 06/11/17 0851  BP: 135/80 (!) 84/41  Pulse: 60 (!) 56  Resp: 16 19  Temp: (!) 36.3 C (!) 36 C  SpO2: 100% 98%    Last Pain:  Vitals:   06/11/17 0851  TempSrc:   PainSc: Asleep      Patients Stated Pain Goal: 2 (23/53/61 4431)  Complications: No apparent anesthesia complications

## 2017-06-12 LAB — SURGICAL PATHOLOGY

## 2017-06-27 ENCOUNTER — Inpatient Hospital Stay: Payer: Medicare Other | Attending: Internal Medicine

## 2017-06-27 ENCOUNTER — Inpatient Hospital Stay (HOSPITAL_BASED_OUTPATIENT_CLINIC_OR_DEPARTMENT_OTHER): Payer: Medicare Other | Admitting: Internal Medicine

## 2017-06-27 ENCOUNTER — Telehealth: Payer: Self-pay

## 2017-06-27 VITALS — BP 134/87 | HR 67 | Temp 97.9°F | Resp 16 | Wt 101.0 lb

## 2017-06-27 DIAGNOSIS — C50812 Malignant neoplasm of overlapping sites of left female breast: Secondary | ICD-10-CM

## 2017-06-27 DIAGNOSIS — Z170 Estrogen receptor positive status [ER+]: Secondary | ICD-10-CM | POA: Insufficient documentation

## 2017-06-27 DIAGNOSIS — F17210 Nicotine dependence, cigarettes, uncomplicated: Secondary | ICD-10-CM

## 2017-06-27 LAB — CBC WITH DIFFERENTIAL/PLATELET
BASOS PCT: 1 %
Basophils Absolute: 0.1 10*3/uL (ref 0–0.1)
EOS ABS: 0.1 10*3/uL (ref 0–0.7)
Eosinophils Relative: 2 %
HCT: 41.9 % (ref 35.0–47.0)
Hemoglobin: 14.5 g/dL (ref 12.0–16.0)
Lymphocytes Relative: 30 %
Lymphs Abs: 1.9 10*3/uL (ref 1.0–3.6)
MCH: 33.1 pg (ref 26.0–34.0)
MCHC: 34.5 g/dL (ref 32.0–36.0)
MCV: 95.8 fL (ref 80.0–100.0)
MONO ABS: 0.7 10*3/uL (ref 0.2–0.9)
MONOS PCT: 11 %
NEUTROS PCT: 56 %
Neutro Abs: 3.6 10*3/uL (ref 1.4–6.5)
PLATELETS: 243 10*3/uL (ref 150–440)
RBC: 4.37 MIL/uL (ref 3.80–5.20)
RDW: 13.1 % (ref 11.5–14.5)
WBC: 6.3 10*3/uL (ref 3.6–11.0)

## 2017-06-27 LAB — COMPREHENSIVE METABOLIC PANEL
ALK PHOS: 44 U/L (ref 38–126)
ALT: 9 U/L — AB (ref 14–54)
AST: 13 U/L — ABNORMAL LOW (ref 15–41)
Albumin: 4.1 g/dL (ref 3.5–5.0)
Anion gap: 8 (ref 5–15)
BUN: 11 mg/dL (ref 6–20)
CALCIUM: 9.3 mg/dL (ref 8.9–10.3)
CO2: 26 mmol/L (ref 22–32)
CREATININE: 0.66 mg/dL (ref 0.44–1.00)
Chloride: 105 mmol/L (ref 101–111)
GFR calc non Af Amer: >60 mL/min (ref >60)
Glucose, Bld: 101 mg/dL — ABNORMAL HIGH (ref 65–99)
Potassium: 4.7 mmol/L (ref 3.5–5.1)
SODIUM: 139 mmol/L (ref 135–145)
Total Bilirubin: 0.9 mg/dL (ref 0.3–1.2)
Total Protein: 7.5 g/dL (ref 6.5–8.1)

## 2017-06-27 NOTE — Telephone Encounter (Signed)
Error

## 2017-06-27 NOTE — Progress Notes (Signed)
[place] OFFICE PROGRESS NOTE  Patient Care Team: [name], MD as PCP - General (Infectious Diseases)   SUMMARY OF ONCOLOGIC HISTORY:  Oncology History   # 2012- LEFT BREAST [pt1b- STAGE I; G-2];ER/PR-Pos; her 2 Neu-NEG;  RS- 21 [risk of recurrence 13%];s/p Lumpec& SLNBx; NO chemo; s/p RT; On Aromasin  [finish April 2017]     Carcinoma of overlapping sites of left breast in female, estrogen receptor positive ([place])     INTERVAL HISTORY:  A pleasant 62 year old female patient with above history of stage I breast cancer ER/PR positive HER-2 negative- status post lumpectomy followed by radiation; no chemotherapy- is here a follow up.   Patient recently had a gallbladder surgery.  Patient noted to have a lump in the left drum.  Denies any breast masses.   Otherwise no nausea no vomiting. No lumps or bumps.Patient complains of cough; shortness of breath on exertion.  Unfortunately continues to smoke.  She is interested in quitting smoking. She is having difficulty quitting  REVIEW OF SYSTEMS:  A complete 10 point review of system is done which is negative except mentioned above/history of present illness.   PAST MEDICAL HISTORY :  Past Medical History:  Diagnosis Date  . Breast cancer ([place]) 2012   left breast  . COPD (chronic obstructive pulmonary disease) ([place])   . Deafness in left ear   . Depression   . Diverticulosis   . 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 Family history of adverse reaction to anesthesia    mother got sick  . Gastritis   . GERD (gastroesophageal reflux disease)   . H/O herpes labialis   . Hemorrhoids   . History of chicken pox   . Hyperplastic colon polyp   . Hypertension   . Multinodular goiter (nontoxic)   . Osteoporosis   . Panic disorder   . Personal history of radiation therapy   . Recurrent sinusitis   . Recurrent sinusitis   . Tobacco use   . Tubular adenoma of colon     PAST SURGICAL HISTORY :   Past  Surgical History:  Procedure Laterality Date  . BACK SURGERY  2018   lumbar  . BREAST EXCISIONAL BIOPSY Left    negative 1980's twice  . BREAST EXCISIONAL BIOPSY Left    positive 2012  . BUNIONECTOMY Right   . CHOLECYSTECTOMY N/A 06/11/2017   Procedure: LAPAROSCOPIC CHOLECYSTECTOMY;  Surgeon: Herbert Pun, MD;  Location: [facility] ORS;  Service: General;  Laterality: N/A;  . COLONOSCOPY WITH PROPOFOL N/A 12/28/2014   Procedure: COLONOSCOPY WITH PROPOFOL;  Surgeon: Lollie Sails, MD;  Location: [facility] ENDOSCOPY;  Service: Endoscopy;  Laterality: N/A;  . cyst throat     base of tongue  . ESOPHAGOGASTRODUODENOSCOPY    . ESOPHAGOGASTRODUODENOSCOPY (EGD) WITH PROPOFOL N/A 12/02/2015   Procedure: ESOPHAGOGASTRODUODENOSCOPY (EGD) WITH PROPOFOL;  Surgeon: Lollie Sails, MD;  Location: [facility] ENDOSCOPY;  Service: Endoscopy;  Laterality: N/A;  . ESOPHAGOGASTRODUODENOSCOPY (EGD) WITH PROPOFOL N/A 04/13/2017   Procedure: ESOPHAGOGASTRODUODENOSCOPY (EGD) WITH PROPOFOL;  Surgeon: Lollie Sails, MD;  Location: [facility] ENDOSCOPY;  Service: Endoscopy;  Laterality: N/A;  . GANGLION CYST EXCISION Right    wrist  . Incision tendon sheath for trigger finger Left   . LUMBAR LAMINECTOMY/DECOMPRESSION MICRODISCECTOMY Right 11/06/2016   Procedure: RIGHT L5/S1 HEMILAMINECTOMY AND CYST RESECTION L5/S1;  Surgeon: Deetta Perla, MD;  Location: [facility] ORS;  Service: Neurosurgery;  Laterality: Right;  . MASTECTOMY, PARTIAL Left    left  node dissection  . NASAL SEPTUM SURGERY    . TONSILLECTOMY    . TRIGGER FINGER RELEASE Left     FAMILY HISTORY :   Family History  Problem Relation Age of Onset  . Breast cancer Maternal Aunt 90       great aunt  . Prostate cancer Father   . Hypertension Father   . Alzheimer's disease Mother   . Heart disease Mother   . Hypertension Mother   . Colon polyps Mother   . Depression Brother        brother #2    SOCIAL HISTORY:   Social History   Tobacco Use  .  Smoking status: Current Every Day Smoker    Packs/day: 0.50    Years: 40.00    Pack years: 20.00    Types: Cigarettes  . Smokeless tobacco: Never Used  Substance Use Topics  . Alcohol use: No    Alcohol/week: 0.0 oz  . Drug use: No    ALLERGIES:  is allergic to erythromycin; septra ds [sulfamethoxazole-trimethoprim]; sulfa antibiotics; tetracyclines & related; and latex.  MEDICATIONS:  Current Outpatient Medications  Medication Sig Dispense Refill  . acetaminophen (TYLENOL) 500 MG tablet Take 1,000 mg by mouth 2 (two) times daily as needed for moderate pain.    Marland Kitchen albuterol (PROVENTIL HFA;VENTOLIN HFA) 108 (90 BASE) MCG/ACT inhaler Inhale 2 puffs into the lungs every 6 (six) hours as needed for wheezing or shortness of breath.     . ALPRAZolam (XANAX) 0.25 MG tablet Take 0.25 mg by mouth 2 (two) times daily as needed for anxiety or sleep.    Marland Kitchen atenolol (TENORMIN) 50 MG tablet Take 50 mg by mouth daily.    . Calcium Carbonate-Vitamin D (CALTRATE 600+D PO) Take 1 tablet by mouth daily. Chewable    . Carboxymethylcellul-Glycerin (LUBRICATING EYE DROPS OP) Apply 2 drops to eye as needed (dry eyes).    . Chlorpheniramine-Acetaminophen (CORICIDIN HBP COLD/FLU PO) Take 2 tablets by mouth 2 (two) times daily as needed (cold symptoms).    . dicyclomine (BENTYL) 10 MG capsule Take 10 mg by mouth 4 (four) times daily as needed for spasms.    Marland Kitchen eletriptan (RELPAX) 40 MG tablet Take 40 mg by mouth every 2 (two) hours as needed for migraine. May repeat in 2 hours if headache persists or recurs.     Marland Kitchen Lifitegrast (XIIDRA) 5 % SOLN Place 1 drop into both eyes 2 (two) times daily as needed (dry eyes).     . pantoprazole (PROTONIX) 40 MG tablet Take 40 mg by mouth daily.     . polyethylene glycol (MIRALAX / GLYCOLAX) packet Take 17 g by mouth daily as needed for mild constipation.     Marland Kitchen spironolactone (ALDACTONE) 50 MG tablet Take 25 mg by mouth daily.     . sucralfate (CARAFATE) 1 GM/10ML suspension Take  1 g by mouth every 6 (six) hours as needed (acid reflux).    . vitamin B-12 (CYANOCOBALAMIN) 1000 MCG tablet Take 1,000 mcg by mouth daily.    . vitamin E 1000 UNIT capsule Take 1,000 Units by mouth daily.    Marland Kitchen zolpidem (AMBIEN) 10 MG tablet Take 10 mg by mouth at bedtime as needed for sleep.     No current facility-administered medications for this visit.     PHYSICAL EXAMINATION: ECOG PERFORMANCE STATUS: 0 - Asymptomatic  BP 134/87 (BP Location: Left Arm, Patient Position: Sitting)   Pulse 67   Temp 97.9 F (36.6 C) (Tympanic)  Resp 16   Wt 101 lb (45.8 kg)   BMI 16.81 kg/m   Filed Weights   06/27/17 1407  Weight: 101 lb (45.8 kg)    GENERAL: Well-nourished well-developed; Alert, no distress and comfortable.   Alone.  EYES: no pallor or icterus OROPHARYNX: no thrush or ulceration; poor dentition  NECK: supple, no masses felt LYMPH:  no palpable lymphadenopathy in the cervical, axillary or inguinal regions LUNGS: clear to auscultation and  No wheeze or crackles HEART/CVS: regular rate & rhythm and no murmurs; No lower extremity edema ABDOMEN:abdomen soft, non-tender and normal bowel sounds Musculoskeletal:no cyanosis of digits and no clubbing  PSYCH: alert & oriented x 3 with fluent speech NEURO: no focal motor/sensory deficits SKIN:  no rashes or significant lesions; left underarm a small sebaceous cyst noted.  Right and left BREAST exam [in the presence of nurse]- no unusual skin changes or dominant masses felt. Surgical scars noted.     LABORATORY DATA:  I have reviewed the data as listed    Component Value Date/Time   NA 139 06/27/2017 1350   NA 136 02/06/2012 0957   K 4.7 06/27/2017 1350   K 3.8 02/06/2012 0957   CL 105 06/27/2017 1350   CO2 26 06/27/2017 1350   GLUCOSE 101 (H) 06/27/2017 1350   BUN 11 06/27/2017 1350   CREATININE 0.66 06/27/2017 1350   CREATININE 0.97 11/12/2013 1508   CALCIUM 9.3 06/27/2017 1350   CALCIUM 9.5 05/08/2012 1018   PROT  7.5 06/27/2017 1350   PROT 7.6 11/12/2013 1508   ALBUMIN 4.1 06/27/2017 1350   ALBUMIN 3.7 11/12/2013 1508   AST 13 (L) 06/27/2017 1350   AST 10 (L) 11/12/2013 1508   ALT 9 (L) 06/27/2017 1350   ALT 15 11/12/2013 1508   ALKPHOS 44 06/27/2017 1350   ALKPHOS 49 11/12/2013 1508   BILITOT 0.9 06/27/2017 1350   BILITOT 0.7 11/12/2013 1508   GFRNONAA >60 06/27/2017 1350   GFRNONAA >60 11/12/2013 1508   GFRAA >60 06/27/2017 1350   GFRAA >60 11/12/2013 1508    No results found for: SPEP, UPEP  Lab Results  Component Value Date   WBC 6.3 06/27/2017   NEUTROABS 3.6 06/27/2017   HGB 14.5 06/27/2017   HCT 41.9 06/27/2017   MCV 95.8 06/27/2017   PLT 243 06/27/2017      Chemistry      Component Value Date/Time   NA 139 06/27/2017 1350   NA 136 02/06/2012 0957   K 4.7 06/27/2017 1350   K 3.8 02/06/2012 0957   CL 105 06/27/2017 1350   CO2 26 06/27/2017 1350   BUN 11 06/27/2017 1350   CREATININE 0.66 06/27/2017 1350   CREATININE 0.97 11/12/2013 1508      Component Value Date/Time   CALCIUM 9.3 06/27/2017 1350   CALCIUM 9.5 05/08/2012 1018   ALKPHOS 44 06/27/2017 1350   ALKPHOS 49 11/12/2013 1508   AST 13 (L) 06/27/2017 1350   AST 10 (L) 11/12/2013 1508   ALT 9 (L) 06/27/2017 1350   ALT 15 11/12/2013 1508   BILITOT 0.9 06/27/2017 1350   BILITOT 0.7 11/12/2013 1508         ASSESSMENT & PLAN:   Carcinoma of overlapping sites of left breast in female, estrogen receptor positive ([place]) # LEFT BREAST CANCER- STAGE I- ER/PR- Positive; Her 2 Neg- s/p aromasin [x 5 years- April 2017].   # No evidence of recurrence; Mammogram in Nov 2018- WNL.   # Smoking/discussed lung cancer  screening- counseled pt regarding quitting smoking. Pt interested; will refer to [facility] smoking   # Recommend lung cancer screening; discussed re: LCS program interested  will inform Burgess Estelle.   # follow up in 12 months/labs.   Cc; Shawn Perkins/Hyaley Appleton.       Cammie Sickle,  MD 07/01/2017 8:36 AM

## 2017-06-27 NOTE — Assessment & Plan Note (Addendum)
#   LEFT BREAST CANCER- STAGE I- ER/PR- Positive; Her 2 Neg- s/p aromasin [x 5 years- April 2017].   # No evidence of recurrence; Mammogram in Nov 2018- WNL.   # "lump under left axilla"small sebaceous cyst.  No signs of infection.  Monitor for now.  # Smoking/discussed lung cancer screening- counseled pt regarding quitting smoking. Pt interested; will refer to [facility] smoking   # Recommend lung cancer screening; discussed re: LCS program interested  will inform Burgess Estelle.   # follow up in 12 months/labs.   Cc; Shawn Perkins/Hyaley Edison.

## 2017-06-28 ENCOUNTER — Telehealth: Payer: Self-pay | Admitting: *Deleted

## 2017-06-28 DIAGNOSIS — Z87891 Personal history of nicotine dependence: Secondary | ICD-10-CM

## 2017-06-28 DIAGNOSIS — Z122 Encounter for screening for malignant neoplasm of respiratory organs: Secondary | ICD-10-CM

## 2017-06-28 NOTE — Telephone Encounter (Signed)
Received referral for initial lung cancer screening scan. Contacted patient and obtained smoking history,(current, 45 pack year) as well as answering questions related to screening process. Patient denies signs of lung cancer such as weight loss or hemoptysis. Patient denies comorbidity that would prevent curative treatment if lung cancer were found. Patient is scheduled for shared decision making visit and CT scan on 07/19/17.

## 2017-07-18 ENCOUNTER — Encounter: Payer: Self-pay | Admitting: Nurse Practitioner

## 2017-07-19 ENCOUNTER — Ambulatory Visit
Admission: RE | Admit: 2017-07-19 | Discharge: 2017-07-19 | Disposition: A | Discharge status: Home / Self Care | Payer: Medicare Other | Source: Ambulatory Visit | Attending: Nurse Practitioner | Admitting: Nurse Practitioner

## 2017-07-19 ENCOUNTER — Inpatient Hospital Stay: Payer: Medicare Other | Attending: Nurse Practitioner | Admitting: Nurse Practitioner

## 2017-07-19 DIAGNOSIS — J439 Emphysema, unspecified: Secondary | ICD-10-CM | POA: Insufficient documentation

## 2017-07-19 DIAGNOSIS — Z87891 Personal history of nicotine dependence: Secondary | ICD-10-CM

## 2017-07-19 DIAGNOSIS — Z122 Encounter for screening for malignant neoplasm of respiratory organs: Secondary | ICD-10-CM

## 2017-07-19 DIAGNOSIS — I700 Atherosclerosis of aorta: Secondary | ICD-10-CM | POA: Diagnosis not present

## 2017-07-19 DIAGNOSIS — R911 Solitary pulmonary nodule: Secondary | ICD-10-CM | POA: Diagnosis not present

## 2017-07-19 DIAGNOSIS — I2510 Atherosclerotic heart disease of native coronary artery without angina pectoris: Secondary | ICD-10-CM | POA: Insufficient documentation

## 2017-07-19 NOTE — Progress Notes (Signed)
In accordance with CMS guidelines, patient has met eligibility criteria including age, absence of signs or symptoms of lung cancer.  Social History   Tobacco Use  . Smoking status: Current Every Day Smoker    Packs/day: 1.00    Years: 45.00    Pack years: 45.00    Types: Cigarettes  . Smokeless tobacco: Never Used  Substance Use Topics  . Alcohol use: No    Alcohol/week: 0.0 oz  . Drug use: No      A shared decision-making session was conducted prior to the performance of CT scan. This includes one or more decision aids, includes benefits and harms of screening, follow-up diagnostic testing, over-diagnosis, false positive rate, and total radiation exposure.   Counseling on the importance of adherence to annual lung cancer LDCT screening, impact of co-morbidities, and ability or willingness to undergo diagnosis and treatment is imperative for compliance of the program.   Counseling on the importance of continued smoking cessation for former smokers; the importance of smoking cessation for current smokers, and information about tobacco cessation interventions have been given to patient including [place] [facility]uit [facility] and 1800 quit [place] programs.   Written order for lung cancer screening with LDCT has been given to the patient and any and all questions have been answered to the best of my abilities.    Yearly follow up will be coordinated by Shawn Perkins, Thoracic Navigator.  Lauren Allen, DNP, AGNP-C [facility] [phone] (work cell) [phone] (office) 07/19/17 2:58 PM   

## 2017-07-20 ENCOUNTER — Other Ambulatory Visit: Payer: Self-pay | Admitting: *Deleted

## 2017-07-20 DIAGNOSIS — R911 Solitary pulmonary nodule: Secondary | ICD-10-CM

## 2017-07-24 ENCOUNTER — Other Ambulatory Visit: Payer: Self-pay

## 2017-07-24 ENCOUNTER — Ambulatory Visit: Payer: Medicare Other | Attending: Oncology

## 2017-07-24 DIAGNOSIS — B001 Herpesviral vesicular dermatitis: Secondary | ICD-10-CM | POA: Insufficient documentation

## 2017-07-24 DIAGNOSIS — M7550 Bursitis of unspecified shoulder: Secondary | ICD-10-CM | POA: Insufficient documentation

## 2017-07-24 DIAGNOSIS — R911 Solitary pulmonary nodule: Secondary | ICD-10-CM

## 2017-07-24 DIAGNOSIS — M810 Age-related osteoporosis without current pathological fracture: Secondary | ICD-10-CM | POA: Insufficient documentation

## 2017-07-24 DIAGNOSIS — C50919 Malignant neoplasm of unspecified site of unspecified female breast: Secondary | ICD-10-CM | POA: Insufficient documentation

## 2017-07-24 DIAGNOSIS — F410 Panic disorder [episodic paroxysmal anxiety] without agoraphobia: Secondary | ICD-10-CM | POA: Insufficient documentation

## 2017-07-24 DIAGNOSIS — K2970 Gastritis, unspecified, without bleeding: Secondary | ICD-10-CM | POA: Insufficient documentation

## 2017-07-24 DIAGNOSIS — I10 Essential (primary) hypertension: Secondary | ICD-10-CM | POA: Insufficient documentation

## 2017-07-24 LAB — BLOOD GAS, ARTERIAL
Acid-base deficit: 0.6 mmol/L (ref 0.0–2.0)
Bicarbonate: 23.2 mmol/L (ref 20.0–28.0)
FIO2: 0.21
O2 Saturation: 97.6 %
PCO2 ART: 35 mmHg (ref 32.0–48.0)
PO2 ART: 95 mmHg (ref 83.0–108.0)
Patient temperature: 37
pH, Arterial: 7.43 (ref 7.350–7.450)

## 2017-07-25 ENCOUNTER — Ambulatory Visit (INDEPENDENT_AMBULATORY_CARE_PROVIDER_SITE_OTHER): Payer: Medicare Other | Admitting: Cardiothoracic Surgery

## 2017-07-25 ENCOUNTER — Encounter: Payer: Self-pay | Admitting: Cardiothoracic Surgery

## 2017-07-25 VITALS — BP 143/95 | HR 66 | Temp 98.3°F | Ht 65.0 in | Wt 101.0 lb

## 2017-07-25 DIAGNOSIS — J329 Chronic sinusitis, unspecified: Secondary | ICD-10-CM | POA: Insufficient documentation

## 2017-07-25 DIAGNOSIS — R918 Other nonspecific abnormal finding of lung field: Secondary | ICD-10-CM

## 2017-07-25 NOTE — Patient Instructions (Signed)
We will refer you to see a cardiologist.   We will schedule your PET Scan and then a follow up visit after you see the cardiologist and PET Scan.

## 2017-07-25 NOTE — Progress Notes (Signed)
Patient ID: Katie Franklin, female   DOB: [birth year], 62 y.o.   MRN: [id number]  Chief Complaint  Patient presents with  . New Patient (Initial Visit)    Lung nodule    Referred By Dr. Iris Pert Monday Reason for Referral left upper lobe mass  HPI Location, Quality, Duration, Severity, Timing, Context, Modifying Factors, Associated Signs and Symptoms.  Katie Franklin is a 62 y.o. female.  She has been followed by our oncology service for the last several years since her diagnosis of left-sided breast cancer treated with lumpectomy and radiation therapy.  She did well with that therapy and did not require chemo.  She had a screening CT scan in May because of her lifelong history of smoking and that revealed a dominant left upper lobe nodule that was not present before a CT in 2017.  She also had a set of pulmonary function studies done revealing an FEV1 of approximately 70% and a DLCO of approximately 80%.  She continues to smoke at least a half a pack of cigarettes a day.  She has been smoking since she was 62 years of age and has smoked upwards of 2 packs a day for some years of her life.  She does not get short of breath although she is relatively sedentary.  She is currently on disability secondary to back surgery.  She has had a 10 pound weight loss over the last year or so and recently underwent a cholecystectomy because she thought she might have some postprandial nausea and vomiting.  She states that her surgery went well and she has an excellent appetite but still has gained little weight in the last couple weeks.  She has not had any hemoptysis.  She denied any fevers, chills or night sweats.  She does have a family history of cancer in her great grandfather.  She also states that her father had a myocardial infarction.   Past Medical History:  Diagnosis Date  . Breast cancer ([place]) 2012   left breast  . COPD (chronic obstructive pulmonary disease) ([place])   . Deafness in left ear   . Depression    . Diverticulosis   . 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 Family history of adverse reaction to anesthesia    mother got sick  . Gastritis   . GERD (gastroesophageal reflux disease)   . H/O herpes labialis   . Hemorrhoids   . History of chicken pox   . Hyperplastic colon polyp   . Hypertension   . Multinodular goiter (nontoxic)   . Osteoporosis   . Panic disorder   . Personal history of radiation therapy   . Recurrent sinusitis   . Recurrent sinusitis   . Tobacco use   . Tubular adenoma of colon     Past Surgical History:  Procedure Laterality Date  . BACK SURGERY  2018   lumbar  . BREAST EXCISIONAL BIOPSY Left    negative 1980's twice  . BREAST EXCISIONAL BIOPSY Left    positive 2012  . BUNIONECTOMY Right   . CHOLECYSTECTOMY N/A 06/11/2017   Procedure: LAPAROSCOPIC CHOLECYSTECTOMY;  Surgeon: Herbert Pun, MD;  Location: [facility] ORS;  Service: General;  Laterality: N/A;  . COLONOSCOPY WITH PROPOFOL N/A 12/28/2014   Procedure: COLONOSCOPY WITH PROPOFOL;  Surgeon: Lollie Sails, MD;  Location: [facility] ENDOSCOPY;  Service: Endoscopy;  Laterality: N/A;  . cyst throat     base of tongue  . ESOPHAGOGASTRODUODENOSCOPY    . ESOPHAGOGASTRODUODENOSCOPY (EGD)  WITH PROPOFOL N/A 12/02/2015   Procedure: ESOPHAGOGASTRODUODENOSCOPY (EGD) WITH PROPOFOL;  Surgeon: Lollie Sails, MD;  Location: [facility] ENDOSCOPY;  Service: Endoscopy;  Laterality: N/A;  . ESOPHAGOGASTRODUODENOSCOPY (EGD) WITH PROPOFOL N/A 04/13/2017   Procedure: ESOPHAGOGASTRODUODENOSCOPY (EGD) WITH PROPOFOL;  Surgeon: Lollie Sails, MD;  Location: [facility] ENDOSCOPY;  Service: Endoscopy;  Laterality: N/A;  . GANGLION CYST EXCISION Right    wrist  . Incision tendon sheath for trigger finger Left   . LUMBAR LAMINECTOMY/DECOMPRESSION MICRODISCECTOMY Right 11/06/2016   Procedure: RIGHT L5/S1 HEMILAMINECTOMY AND CYST RESECTION L5/S1;  Surgeon: Deetta Perla, MD;  Location: [facility] ORS;  Service: Neurosurgery;   Laterality: Right;  . MASTECTOMY, PARTIAL Left    left node dissection  . NASAL SEPTUM SURGERY    . TONSILLECTOMY    . TRIGGER FINGER RELEASE Left     Family History  Problem Relation Age of Onset  . Breast cancer Maternal Aunt 90       great aunt  . Prostate cancer Father   . Hypertension Father   . Alzheimer's disease Mother   . Heart disease Mother   . Hypertension Mother   . Colon polyps Mother   . Depression Brother        brother #2    Social History Social History   Tobacco Use  . Smoking status: Current Every Day Smoker    Packs/day: 1.00    Years: 45.00    Pack years: 45.00    Types: Cigarettes  . Smokeless tobacco: Never Used  Substance Use Topics  . Alcohol use: No    Alcohol/week: 0.0 oz  . Drug use: No    Allergies  Allergen Reactions  . Erythromycin Anaphylaxis and Rash  . Septra Ds [Sulfamethoxazole-Trimethoprim] Anaphylaxis    Neck swelling as well   . Sulfa Antibiotics Anaphylaxis  . Tetracyclines & Related Hives and Itching  . Latex Rash    Plastic bandaids/ gloves    Current Outpatient Medications  Medication Sig Dispense Refill  . acetaminophen (TYLENOL) 500 MG tablet Take 1,000 mg by mouth 2 (two) times daily as needed for moderate pain.    Marland Kitchen albuterol (PROVENTIL HFA;VENTOLIN HFA) 108 (90 BASE) MCG/ACT inhaler Inhale 2 puffs into the lungs every 6 (six) hours as needed for wheezing or shortness of breath.     . ALPRAZolam (XANAX) 0.25 MG tablet Take 0.25 mg by mouth 2 (two) times daily as needed for anxiety or sleep.    Marland Kitchen atenolol (TENORMIN) 50 MG tablet Take 50 mg by mouth daily.    . Calcium Carbonate-Vitamin D (CALTRATE 600+D PO) Take 1 tablet by mouth daily. Chewable    . Carboxymethylcellul-Glycerin (LUBRICATING EYE DROPS OP) Apply 2 drops to eye as needed (dry eyes).    . Chlorpheniramine-Acetaminophen (CORICIDIN HBP COLD/FLU PO) Take 2 tablets by mouth 2 (two) times daily as needed (cold symptoms).    . dicyclomine (BENTYL) 10 MG  capsule Take 10 mg by mouth 4 (four) times daily as needed for spasms.    Marland Kitchen eletriptan (RELPAX) 40 MG tablet Take 40 mg by mouth every 2 (two) hours as needed for migraine. May repeat in 2 hours if headache persists or recurs.     Marland Kitchen Lifitegrast (XIIDRA) 5 % SOLN Place 1 drop into both eyes 2 (two) times daily as needed (dry eyes).     Marland Kitchen omeprazole (PRILOSEC) 40 MG capsule Take 1 capsule by mouth 1 day or 1 dose.    . polyethylene glycol (MIRALAX / GLYCOLAX) packet Take  17 g by mouth daily as needed for mild constipation.     Marland Kitchen spironolactone (ALDACTONE) 50 MG tablet Take 25 mg by mouth daily.     . sucralfate (CARAFATE) 1 GM/10ML suspension Take 1 g by mouth every 6 (six) hours as needed (acid reflux).    . vitamin B-12 (CYANOCOBALAMIN) 1000 MCG tablet Take 1,000 mcg by mouth daily.    . vitamin E 1000 UNIT capsule Take 1,000 Units by mouth daily.    Marland Kitchen zolpidem (AMBIEN) 10 MG tablet Take 10 mg by mouth at bedtime as needed for sleep.     No current facility-administered medications for this visit.       Review of Systems A complete review of systems was asked and was negative except for the following positive findings weight loss.  Occasional palpitations and feeling of rapid heartbeat.  Blood pressure (!) 143/95, pulse 66, temperature 98.3 F (36.8 C), temperature source Oral, weight 101 lb (45.8 kg).  Physical Exam CONSTITUTIONAL:  Pleasant, well-developed, well-nourished, and in no acute distress. EYES: Pupils equal and reactive to light, Sclera non-icteric EARS, NOSE, MOUTH AND THROAT:  The oropharynx was clear.  Dentition is good repair.  Oral mucosa pink and moist. LYMPH NODES:  Lymph nodes in the neck and axillae were normal RESPIRATORY:  Lungs were clear.  Normal respiratory effort without pathologic use of accessory muscles of respiration CARDIOVASCULAR: Heart was regular without murmurs.  There were no carotid bruits. GI: The abdomen was soft, nontender, and nondistended. There  were no palpable masses. There was no hepatosplenomegaly. There were normal bowel sounds in all quadrants. GU:  Rectal deferred.   MUSCULOSKELETAL:  Normal muscle strength and tone.  No clubbing or cyanosis.   SKIN:  There were no pathologic skin lesions.  There were no nodules on palpation. NEUROLOGIC:  Sensation is normal.  Cranial nerves are grossly intact. PSYCH:  Oriented to person, place and time.  Mood and affect are normal.  Data Reviewed CT scans  I have personally reviewed the patient's imaging, laboratory findings and medical records.    Assessment    I have independently reviewed the patient's CT scans.  There is a dominant left upper lobe spiculated nodule that is most consistent with a carcinoma of the lung.  Her pulmonary function studies are adequate for pulmonary resection.    Plan    I had a long discussion with her regarding all the various treatment options and diagnostic options.  We did discuss the role of a percutaneous biopsy.  We discussed the pros and cons of having a biopsy prior to surgery.  We discussed the role of surgery including the role of wedge resection, wedge resection with radiation and lobectomy.  The indications and risks for the various options were discussed.  We also discussed the role of radiation therapy alone.  She does have extensive coronary calcifications on her CT scan and her history of cardiac palpitations and vague atypical chest pain do warrant a referral to our cardiologist for their evaluation prior to surgery.  She will come back and see me again next week.  All of her questions were answered today.       Nestor Lewandowsky, MD 07/25/2017, 11:08 AM

## 2017-07-26 ENCOUNTER — Other Ambulatory Visit: Payer: Self-pay | Admitting: Internal Medicine

## 2017-07-26 DIAGNOSIS — C50812 Malignant neoplasm of overlapping sites of left female breast: Secondary | ICD-10-CM

## 2017-07-26 DIAGNOSIS — Z170 Estrogen receptor positive status [ER+]: Principal | ICD-10-CM

## 2017-07-27 ENCOUNTER — Telehealth: Payer: Self-pay

## 2017-07-27 NOTE — Telephone Encounter (Signed)
Called patient to let her know that her PET Scan is scheduled to be on 08/01/2017 and her appointment with the cardiologist on 08/02/2017. Patient was instructed to be fasting for her PET Scan. I will call patient back to let her know when she will return to see Dr. Genevive Bi to go over PET Scan and Dr. Donivan Scull appointment. Patient understood and had no questions.

## 2017-08-01 ENCOUNTER — Ambulatory Visit
Admission: RE | Admit: 2017-08-01 | Discharge: 2017-08-01 | Disposition: A | Discharge status: Home / Self Care | Payer: Medicare Other | Source: Ambulatory Visit | Attending: Cardiothoracic Surgery | Admitting: Cardiothoracic Surgery

## 2017-08-01 DIAGNOSIS — R918 Other nonspecific abnormal finding of lung field: Secondary | ICD-10-CM | POA: Insufficient documentation

## 2017-08-01 DIAGNOSIS — K929 Disease of digestive system, unspecified: Secondary | ICD-10-CM | POA: Insufficient documentation

## 2017-08-01 LAB — GLUCOSE, CAPILLARY: Glucose-Capillary: 85 mg/dL (ref 65–99)

## 2017-08-01 MED ORDER — FLUDEOXYGLUCOSE F - 18 (FDG) INJECTION
5.2000 | Freq: Once | INTRAVENOUS | Status: AC | PRN
  Administered 2017-08-01: 5.11 via INTRAVENOUS

## 2017-08-01 NOTE — Progress Notes (Signed)
Cardiology Office Note  Date:  08/02/2017   ID:  Katie Franklin, DOB March 25, 1956, MRN [id number]  PCP:  [name], MD   Chief Complaint  Patient presents with  . other    Ref by Dr. Nestor Lewandowsky for aortic atherosclerosis shown on CT of lungs. Meds reviewed by the pt.'s med list. Pt. c/o shortness of breath with occas. rapid heart beats.     HPI:  Katie Franklin is a 62 y.o. female.   Active smoker COPD: Moderate centrilobular and mild paraseptal emphysema on CT scan left-sided breast cancer treated with lumpectomy and radiation therapy.   Laparoscopic Cholecystectomy.  Referred by Dr. Faith Rogue for CAD on CT scan/Pet scan, lung nodule   CT scan in May 2019  left upper lobe nodule   pulmonary function studies  FEV1 of approximately 70% and a DLCO of approximately 80%.    smoked at least a half a pack of cigarettes a day  Reports that she stopped smoking last week  smoking since she was 62 years of age   smoked upwards of 2 packs a day for some years of her life  No shortness of breath on exertion  on disability secondary to back surgery.   Can climb steps without shortness of breath Walking around the block, does not have to stop Back problems, unable to lift heavy items "I walk a lot" Goes shopping, mall "just looking" when she is bored  Weight loss following gallbladder surgery Chronic diarrhea Previously with H. pylori, reports this has been treated  CT scan 07/2017 images reviewed with her in detail Mild aortic atherosclerosis Moderate three-vessel coronary calcification Minimal carotid atherosclerosis  family history of cancer in her great grandfather.    father had a myocardial infarction.  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  PSH:    Past Surgical History:  Procedure Laterality Date  . BACK SURGERY  2018   lumbar  . BREAST EXCISIONAL BIOPSY Left    negative 1980's twice  . BREAST EXCISIONAL BIOPSY Left    positive 2012  . BUNIONECTOMY Right   . CHOLECYSTECTOMY N/A 06/11/2017   Procedure: LAPAROSCOPIC CHOLECYSTECTOMY;  Surgeon: Herbert Pun, MD;  Location: [facility] ORS;  Service: General;  Laterality: N/A;  . COLONOSCOPY WITH PROPOFOL N/A 12/28/2014   Procedure: COLONOSCOPY WITH PROPOFOL;  Surgeon: Lollie Sails, MD;  Location: [facility] ENDOSCOPY;  Service: Endoscopy;  Laterality: N/A;  . cyst throat     base of tongue  . ESOPHAGOGASTRODUODENOSCOPY    . ESOPHAGOGASTRODUODENOSCOPY (EGD) WITH PROPOFOL N/A 12/02/2015   Procedure: ESOPHAGOGASTRODUODENOSCOPY (EGD) WITH PROPOFOL;  Surgeon: Lollie Sails, MD;  Location: [facility] ENDOSCOPY;  Service: Endoscopy;  Laterality: N/A;  . ESOPHAGOGASTRODUODENOSCOPY (EGD) WITH PROPOFOL N/A 04/13/2017   Procedure: ESOPHAGOGASTRODUODENOSCOPY (EGD) WITH PROPOFOL;  Surgeon: Lollie Sails, MD;  Location: [facility] ENDOSCOPY;  Service: Endoscopy;  Laterality: N/A;  . GANGLION CYST EXCISION Right    wrist  .  Incision tendon sheath for trigger finger Left   . LUMBAR LAMINECTOMY/DECOMPRESSION MICRODISCECTOMY Right 11/06/2016   Procedure: RIGHT L5/S1 HEMILAMINECTOMY AND CYST RESECTION L5/S1;  Surgeon: Deetta Perla, MD;  Location: [facility] ORS;  Service: Neurosurgery;  Laterality: Right;  . MASTECTOMY, PARTIAL Left    left node dissection   . NASAL SEPTUM SURGERY    . TONSILLECTOMY    . TRIGGER FINGER RELEASE Left     Current Outpatient Medications  Medication Sig Dispense Refill  . acetaminophen (TYLENOL) 500 MG tablet Take 1,000 mg by mouth 2 (two) times daily as needed for moderate pain.    Marland Kitchen albuterol (PROVENTIL HFA;VENTOLIN HFA) 108 (90 BASE) MCG/ACT inhaler Inhale 2 puffs into the lungs every 6 (six) hours as needed for wheezing or shortness of breath.     . ALPRAZolam (XANAX) 0.25 MG tablet Take 1 tablet by mouth 2 (two) times daily as needed.    Marland Kitchen atenolol (TENORMIN) 50 MG tablet Take 50 mg by mouth daily.    . Calcium Carbonate-Vitamin D (CALTRATE 600+D PO) Take 1 tablet by mouth daily. Chewable    . Carboxymethylcellul-Glycerin (LUBRICATING EYE DROPS OP) Apply 2 drops to eye as needed (dry eyes).    Marland Kitchen dicyclomine (BENTYL) 10 MG capsule Take 10 mg by mouth 4 (four) times daily as needed for spasms.    . nicotine (NICODERM CQ - DOSED IN MG/24 HOURS) 14 mg/24hr patch Place 14 mg onto the skin daily.    . pantoprazole (PROTONIX) 40 MG tablet Take 40 mg by mouth daily.    Marland Kitchen spironolactone (ALDACTONE) 50 MG tablet Take 25 mg by mouth daily.     . sucralfate (CARAFATE) 1 GM/10ML suspension Take 1 g by mouth every 6 (six) hours as needed (acid reflux).    . vitamin B-12 (CYANOCOBALAMIN) 1000 MCG tablet Take 1,000 mcg by mouth daily.    . vitamin E 1000 UNIT capsule Take 1,000 Units by mouth daily.    Marland Kitchen zolpidem (AMBIEN) 10 MG tablet Take 10 mg by mouth at bedtime as needed for sleep.     No current facility-administered medications for this visit.      Allergies:   Erythromycin; Septra ds [sulfamethoxazole-trimethoprim]; Sulfa antibiotics; Tetracyclines & related; and Latex   Social History:  The patient  reports that she quit smoking 7 days ago. Her smoking use included cigarettes. She has a 45.00 pack-year smoking history. She has never used smokeless tobacco. She reports that she does not drink alcohol or use drugs.    Family History:   family history includes Alzheimer's disease in her mother; Breast cancer (age of onset: 6) in her maternal aunt; Colon polyps in her mother; Depression in her brother; Heart disease in her father and mother; Hypertension in her father and mother; Prostate cancer in her father.    Review of Systems: Review of Systems  Constitutional: Negative.   Respiratory: Negative.   Cardiovascular: Negative.   Gastrointestinal: Negative.   Musculoskeletal: Positive for back pain.  Neurological: Negative.   Psychiatric/Behavioral: Negative.   All other systems reviewed and are negative.    PHYSICAL EXAM: VS:  BP 120/80 (BP Location: Right Arm, Patient Position: Sitting, Cuff Size: Normal)   Pulse 67   Ht 5\' 5"  (1.651 m)   Wt 98 lb 8 oz (44.7 kg)   BMI 16.39 kg/m  , BMI Body mass index is 16.39 kg/m. GEN: Well nourished, well developed, in no acute distress  HEENT: normal  Neck: no JVD, carotid bruits, or masses  Cardiac: RRR; no murmurs, rubs, or gallops,no edema  Respiratory:  clear to auscultation bilaterally, normal work of breathing GI: soft, nontender, nondistended, + BS MS: no deformity or atrophy  Skin: warm and dry, no rash Neuro:  Strength and sensation are intact Psych: euthymic mood, full affect    Recent Labs: 06/27/2017: ALT 9; BUN 11; Creatinine, Ser 0.66; Hemoglobin 14.5; Platelets 243; Potassium 4.7; Sodium 139    Lipid Panel No results found for: CHOL, HDL, LDLCALC, TRIG    Wt Readings from Last 3 Encounters:  08/02/17 98 lb 8 oz (44.7 kg)  07/25/17 101 lb (45.8 kg)  07/19/17 104 lb (47.2 kg)       ASSESSMENT AND PLAN:  Aortic atherosclerosis (HCC) Mild disease on CT scan, images reviewed with her on today's visit Stressed importance of smoking cessation We will repeat lipid panel, fasting to determine new numbers after recent weight loss Cholesterol was elevated several years ago 216, currently not on a statin  Will reassess need for  statin after her numbers  Centrilobular emphysema (HCC) Mild shortness of breath, does not seem to slow her down Reasonable exercise tolerance, able to achieve 4-5 METS without difficulty, no chest pain   Coronary artery disease involving native coronary artery of native heart without angina pectoris - Plan: EKG 12-Lead, Lipid Profile Three-vessel calcifications with reasonable exercise tolerance as above Given ability to exert herself without symptoms of angina or worsening shortness of breath, she would be acceptable risk for lobectomy   Lung nodule Followed by Dr. Faith Rogue Left upper lobe concerning for cancer Would be acceptable risk for lobectomy  currently with reasonable exercise tolerance, no anginal symptoms. Additional cardiac testing would likely unnecessary delay treatment of her cancer  Smoker Stopped smoking last week Reports she has no desire to start smoking again  Essential hypertension - Plan: EKG 12-Lead Blood pressure is well controlled on today's visit. No changes made to the medications.  History of breast cancer Prior radiation on the left may accelerate underlying coronary disease Recommended we stay vigilant with her risk factors, low threshold for stress testing  Disposition:   F/U 12 mon   Total encounter time more than 60 minutes  Greater than 50% was spent in counseling and coordination of care with the patient    Orders Placed This Encounter  Procedures  . Lipid Profile  . EKG 12-Lead     Signed, Esmond Plants, M.D., Ph.D. 08/02/2017  [place], IXL

## 2017-08-02 ENCOUNTER — Ambulatory Visit (INDEPENDENT_AMBULATORY_CARE_PROVIDER_SITE_OTHER): Payer: Medicare Other | Admitting: Cardiovascular Disease

## 2017-08-02 ENCOUNTER — Telehealth: Payer: Self-pay

## 2017-08-02 ENCOUNTER — Encounter: Payer: Self-pay | Admitting: Cardiovascular Disease

## 2017-08-02 VITALS — BP 120/80 | HR 67 | Ht 65.0 in | Wt 98.5 lb

## 2017-08-02 DIAGNOSIS — I700 Atherosclerosis of aorta: Secondary | ICD-10-CM

## 2017-08-02 DIAGNOSIS — I10 Essential (primary) hypertension: Secondary | ICD-10-CM

## 2017-08-02 DIAGNOSIS — J432 Centrilobular emphysema: Secondary | ICD-10-CM

## 2017-08-02 DIAGNOSIS — I2510 Atherosclerotic heart disease of native coronary artery without angina pectoris: Secondary | ICD-10-CM

## 2017-08-02 DIAGNOSIS — R911 Solitary pulmonary nodule: Secondary | ICD-10-CM | POA: Insufficient documentation

## 2017-08-02 DIAGNOSIS — Z853 Personal history of malignant neoplasm of breast: Secondary | ICD-10-CM | POA: Diagnosis not present

## 2017-08-02 DIAGNOSIS — F17200 Nicotine dependence, unspecified, uncomplicated: Secondary | ICD-10-CM | POA: Diagnosis not present

## 2017-08-02 NOTE — Patient Instructions (Addendum)
Medication Instructions:   No medication changes made  Labwork:  Fasting lipids at your convenience here  Testing/Procedures:  No testing at this time    Follow-Up: It was a pleasure seeing you in the office today. Please call us if you have new issues that need to be addressed before your next appt.  [phone]  Your physician wants you to follow-up in: 12 months.  You will receive a reminder letter in the mail two months in advance. If you don't receive a letter, please call our office to schedule the follow-up appointment.  If you need a refill on your cardiac medications before your next appointment, please call your pharmacy.  For educational health videos Log in to : [email or web address] Or : [email or web address], password : [email or web address]

## 2017-08-02 NOTE — Telephone Encounter (Signed)
Cardiac clearance was mailed to Dr. Donivan Scull office. Awaiting for response.

## 2017-08-03 ENCOUNTER — Telehealth: Payer: Self-pay | Admitting: Cardiovascular Disease

## 2017-08-03 NOTE — Telephone Encounter (Signed)
See office visit note in Epic for her cardiac clearance.

## 2017-08-03 NOTE — Telephone Encounter (Signed)
° °  [facility] Pre-operative Risk Assessment    Request for surgical clearance:  1. What type of surgery is being performed? Thoracotomy/Lobectomy    2. When is this surgery scheduled? TBA    3. What type of clearance is required (medical clearance vs. Pharmacy clearance to hold med vs. Both)? Both   4. Are there any medications that need to be held prior to surgery and how long? Not listed   5. Practice name and name of physician performing surgery? Dr [facility]   6. What is your office phone number [phone]    7.   What is your office fax number [phone]   8.   Anesthesia type (None, local, MAC, general) ? Not listed

## 2017-08-06 NOTE — Telephone Encounter (Signed)
Cardiac clearance was obtained. Look at patient's visit with Dr. Rockey Situ from 08/02/2017.

## 2017-08-06 NOTE — Telephone Encounter (Signed)
Dr. Donivan Scull office faxed over patients cardiac Clearance, per fax said to look at patients note in Epic for her cardiac clearance.

## 2017-08-09 ENCOUNTER — Ambulatory Visit (INDEPENDENT_AMBULATORY_CARE_PROVIDER_SITE_OTHER): Payer: Medicare Other | Admitting: Cardiothoracic Surgery

## 2017-08-09 ENCOUNTER — Encounter: Payer: Self-pay | Admitting: Cardiothoracic Surgery

## 2017-08-09 VITALS — BP 134/80 | HR 71 | Temp 98.3°F | Wt 102.0 lb

## 2017-08-09 DIAGNOSIS — R918 Other nonspecific abnormal finding of lung field: Secondary | ICD-10-CM | POA: Diagnosis not present

## 2017-08-09 NOTE — Progress Notes (Signed)
  Patient ID: Katie Franklin, female   DOB: 08-Feb-1956, 62 y.o.   MRN: [id number]  HISTORY: She returns today in follow-up.  The good news is that she is quit smoking.  She is also gained 4 pounds over the last 2 weeks.  She states that overall she believes that her breathing is improved.  She did have a PET scan done which revealed intense uptake in the left upper lobe lesion which was most consistent with a bronchogenic carcinoma.  There is no evidence of distant metastases.  She is also had some areas of uptake in the rectosigmoid junction and a colonoscopy was recommended.  She does have a history of diverticulitis and since her recent cholecystectomy has had some diarrhea.  However a colon cancer has not been excluded.   Vitals:   08/09/17 1127  BP: 134/80  Pulse: 71  Temp: 98.3 F (36.8 C)     EXAM:    Resp: Lungs are clear bilaterally.  No respiratory distress, normal effort. Heart:  Regular without murmurs Abd:  Abdomen is soft, non distended and non tender. No masses are palpable.  There is no rebound and no guarding.  Neurological: Alert and oriented to person, place, and time. Coordination normal.  Skin: Skin is warm and dry. No rash noted. No diaphoretic. No erythema. No pallor.  Psychiatric: Normal mood and affect. Normal behavior. Judgment and thought content normal.    ASSESSMENT: I have independently reviewed the patient's PET scan.  I discussed with them the results.  I do believe that the patient has a stage I carcinoma of the lung and the colon uptake as been yet undefined.   PLAN:   First would like to get Dr. Gustavo Lah to review the PET scan and consider colonoscopy.  I had a long discussion with the patient regarding the indications and risks of surgical intervention for her presumed stage I carcinoma of the lung.  We also discussed the role that radiation therapy may play.  She understands that a biopsy has not yet been undertaken and that one may or may not be  needed prior to any intervention.  I also discussed with her the role of wedge resection versus lobectomy.  She understands that lobectomy carries a higher risk but a overall improved survival rate.  She also understands that the impact to her underlying lung function is more severe following lobectomy.  At the present time she could not express a preference for any surgical intervention at this time.  She will think about her options and she will contact me on Monday when I will discuss with her again the implications of any surgical intervention.    Nestor Lewandowsky, MD

## 2017-08-09 NOTE — H&P (View-Only) (Signed)
  Patient ID: Katie Franklin, female   DOB: [birth year], 62 y.o.   MRN: [id number]  HISTORY: She returns today in follow-up.  The good news is that she is quit smoking.  She is also gained 4 pounds over the last 2 weeks.  She states that overall she believes that her breathing is improved.  She did have a PET scan done which revealed intense uptake in the left upper lobe lesion which was most consistent with a bronchogenic carcinoma.  There is no evidence of distant metastases.  She is also had some areas of uptake in the rectosigmoid junction and a colonoscopy was recommended.  She does have a history of diverticulitis and since her recent cholecystectomy has had some diarrhea.  However a colon cancer has not been excluded.   Vitals:   08/09/17 1127  BP: 134/80  Pulse: 71  Temp: 98.3 F (36.8 C)     EXAM:    Resp: Lungs are clear bilaterally.  No respiratory distress, normal effort. Heart:  Regular without murmurs Abd:  Abdomen is soft, non distended and non tender. No masses are palpable.  There is no rebound and no guarding.  Neurological: Alert and oriented to person, place, and time. Coordination normal.  Skin: Skin is warm and dry. No rash noted. No diaphoretic. No erythema. No pallor.  Psychiatric: Normal mood and affect. Normal behavior. Judgment and thought content normal.    ASSESSMENT: I have independently reviewed the patient's PET scan.  I discussed with them the results.  I do believe that the patient has a stage I carcinoma of the lung and the colon uptake as been yet undefined.   PLAN:   First would like to get Dr. Gustavo Lah to review the PET scan and consider colonoscopy.  I had a long discussion with the patient regarding the indications and risks of surgical intervention for her presumed stage I carcinoma of the lung.  We also discussed the role that radiation therapy may play.  She understands that a biopsy has not yet been undertaken and that one may or may not be  needed prior to any intervention.  I also discussed with her the role of wedge resection versus lobectomy.  She understands that lobectomy carries a higher risk but a overall improved survival rate.  She also understands that the impact to her underlying lung function is more severe following lobectomy.  At the present time she could not express a preference for any surgical intervention at this time.  She will think about her options and she will contact me on Monday when I will discuss with her again the implications of any surgical intervention.    Nestor Lewandowsky, MD

## 2017-08-13 ENCOUNTER — Telehealth: Payer: Self-pay | Admitting: Cardiothoracic Surgery

## 2017-08-13 NOTE — Telephone Encounter (Signed)
This patient contacted me today.  She and her family have discussed the options.  They do not want a biopsy of her lung prior to any surgical intervention.  They would like to pursue a wedge resection only if it can be successfully accomplished.  She understands that if this cannot be technically accomplished or if the margins are close or positive that a lobectomy would be required.  However she would like to preserve as much lung function as possible.  She understands that a diagnosis of malignancy has not been made with certainty yet.  She does not wish to have a biopsy.  She also understands that a wedge resection may carry a higher risk of recurrence.  After extensive discussion with her she would like to proceed.

## 2017-08-15 ENCOUNTER — Telehealth: Payer: Self-pay | Admitting: Cardiothoracic Surgery

## 2017-08-15 DIAGNOSIS — C3490 Malignant neoplasm of unspecified part of unspecified bronchus or lung: Secondary | ICD-10-CM

## 2017-08-15 HISTORY — DX: Malignant neoplasm of unspecified part of unspecified bronchus or lung: C34.90

## 2017-08-15 NOTE — Telephone Encounter (Signed)
Pt advised of pre op date/time and sx date. Sx: 08/27/17 with Dr Sudie Grumbling operative bronchoscopy, left thoracotomy with lung resection.  Pre op: 08/20/17 @ 1:45pm-office interview.   Patient made aware to call [phone], between 1-3:00pm the day before surgery, to find out what time to arrive.

## 2017-08-16 ENCOUNTER — Other Ambulatory Visit: Payer: Self-pay | Admitting: Cardiothoracic Surgery

## 2017-08-16 ENCOUNTER — Encounter: Payer: Self-pay | Admitting: Student

## 2017-08-16 DIAGNOSIS — R918 Other nonspecific abnormal finding of lung field: Secondary | ICD-10-CM

## 2017-08-17 ENCOUNTER — Ambulatory Visit: Payer: Medicare Other | Admitting: Anesthesiology

## 2017-08-17 ENCOUNTER — Other Ambulatory Visit: Payer: Self-pay | Admitting: *Deleted

## 2017-08-17 ENCOUNTER — Encounter
Admission: RE | Disposition: A | Discharge status: Home / Self Care | Payer: Self-pay | Source: Ambulatory Visit | Attending: Gastroenterology

## 2017-08-17 ENCOUNTER — Encounter: Payer: Self-pay | Admitting: Certified Registered Nurse Anesthetist

## 2017-08-17 ENCOUNTER — Ambulatory Visit
Admission: RE | Admit: 2017-08-17 | Discharge: 2017-08-17 | Disposition: A | Discharge status: Home / Self Care | Payer: Medicare Other | Source: Ambulatory Visit | Attending: Gastroenterology | Admitting: Gastroenterology

## 2017-08-17 DIAGNOSIS — J449 Chronic obstructive pulmonary disease, unspecified: Secondary | ICD-10-CM | POA: Insufficient documentation

## 2017-08-17 DIAGNOSIS — K219 Gastro-esophageal reflux disease without esophagitis: Secondary | ICD-10-CM | POA: Diagnosis not present

## 2017-08-17 DIAGNOSIS — K5730 Diverticulosis of large intestine without perforation or abscess without bleeding: Secondary | ICD-10-CM | POA: Diagnosis present

## 2017-08-17 DIAGNOSIS — Z87891 Personal history of nicotine dependence: Secondary | ICD-10-CM | POA: Insufficient documentation

## 2017-08-17 DIAGNOSIS — Z79899 Other long term (current) drug therapy: Secondary | ICD-10-CM | POA: Diagnosis not present

## 2017-08-17 DIAGNOSIS — R933 Abnormal findings on diagnostic imaging of other parts of digestive tract: Secondary | ICD-10-CM | POA: Diagnosis not present

## 2017-08-17 DIAGNOSIS — I10 Essential (primary) hypertension: Secondary | ICD-10-CM | POA: Insufficient documentation

## 2017-08-17 DIAGNOSIS — F418 Other specified anxiety disorders: Secondary | ICD-10-CM | POA: Insufficient documentation

## 2017-08-17 DIAGNOSIS — Z853 Personal history of malignant neoplasm of breast: Secondary | ICD-10-CM | POA: Insufficient documentation

## 2017-08-17 DIAGNOSIS — I739 Peripheral vascular disease, unspecified: Secondary | ICD-10-CM | POA: Diagnosis not present

## 2017-08-17 HISTORY — DX: Other specified bacterial intestinal infections: A04.8

## 2017-08-17 HISTORY — PX: COLONOSCOPY WITH PROPOFOL: SHX5780

## 2017-08-17 SURGERY — COLONOSCOPY WITH PROPOFOL
Anesthesia: General

## 2017-08-17 MED ORDER — SODIUM CHLORIDE 0.9 % IV SOLN
INTRAVENOUS | Status: DC
  Administered 2017-08-17: 1000 mL via INTRAVENOUS

## 2017-08-17 MED ORDER — PROPOFOL 500 MG/50ML IV EMUL
INTRAVENOUS | Status: DC | PRN
  Administered 2017-08-17: 130 ug/kg/min via INTRAVENOUS

## 2017-08-17 MED ORDER — LIDOCAINE HCL (PF) 1 % IJ SOLN
2.0000 mL | Freq: Once | INTRAMUSCULAR | Status: AC
  Administered 2017-08-17: 0.3 mL via INTRADERMAL

## 2017-08-17 MED ORDER — LIDOCAINE HCL (CARDIAC) PF 100 MG/5ML IV SOSY
PREFILLED_SYRINGE | INTRAVENOUS | Status: DC | PRN
  Administered 2017-08-17: 50 mg via INTRAVENOUS

## 2017-08-17 MED ORDER — PROPOFOL 500 MG/50ML IV EMUL
INTRAVENOUS | Status: AC
  Filled 2017-08-17: qty 50

## 2017-08-17 MED ORDER — LIDOCAINE HCL (PF) 1 % IJ SOLN
INTRAMUSCULAR | Status: AC
  Administered 2017-08-17: 0.3 mL via INTRADERMAL
  Filled 2017-08-17: qty 2

## 2017-08-17 MED ORDER — PROPOFOL 10 MG/ML IV BOLUS
INTRAVENOUS | Status: DC | PRN
  Administered 2017-08-17: 90 mg via INTRAVENOUS

## 2017-08-17 MED ORDER — LIDOCAINE HCL (PF) 2 % IJ SOLN
INTRAMUSCULAR | Status: AC
  Filled 2017-08-17: qty 10

## 2017-08-17 NOTE — Anesthesia Post-op Follow-up Note (Signed)
Anesthesia QCDR form completed.        

## 2017-08-17 NOTE — H&P (Signed)
Outpatient short stay form Pre-procedure 08/17/2017 11:56 AM Lollie Sails MD  Primary Physician: Dr. Adrian Prows  Reason for visit: Colonoscopy  History of present illness: Patient is a 62 year old female presenting today as above.  She has a recent history of a new diagnosis of lung cancer and on a PET scan done in this further evaluation she was noted to have an abnormal uptake in the region of the sigmoid colon.  She is presenting today for further evaluation in regard to this.  Also had a recent cholecystectomy for chronic right upper quadrant pain has much improved since then ever she continues to have some amount of choleretic diarrhea.  Does take a reflux medication and some Carafate.  Patient tolerated her prep well.  She takes no aspirin or blood thinning agent.   No current facility-administered medications for this encounter.   Medications Prior to Admission  Medication Sig Dispense Refill Last Dose  . eletriptan (RELPAX) 40 MG tablet Take 40 mg by mouth as needed for migraine or headache. May repeat in 2 hours if headache persists or recurs.     Marland Kitchen acetaminophen (TYLENOL) 500 MG tablet Take 500-1,000 mg by mouth 2 (two) times daily as needed for moderate pain (FOR HEADACHES/BACK PAIN.).    Taking  . albuterol (PROVENTIL HFA;VENTOLIN HFA) 108 (90 BASE) MCG/ACT inhaler Inhale 2 puffs into the lungs every 6 (six) hours as needed for wheezing or shortness of breath.    Taking  . ALPRAZolam (XANAX) 0.25 MG tablet Take 0.25 mg by mouth 3 (three) times daily as needed for anxiety.    Taking  . atenolol (TENORMIN) 50 MG tablet Take 50 mg by mouth daily.   Taking  . Calcium Carbonate-Vitamin D (CALTRATE 600+D PO) Take 1 tablet by mouth daily. Chewable   Taking  . cholestyramine (QUESTRAN) 4 g packet Take 2 g by mouth daily as needed (for loose bowels/diarrhea.).      Marland Kitchen dicyclomine (BENTYL) 10 MG capsule Take 10 mg by mouth 4 (four) times daily as needed for spasms (FOR ABDOMINAL  SPASM.).    Taking  . Lifitegrast (XIIDRA) 5 % SOLN Place 1 drop into both eyes 2 (two) times daily as needed (for excessively dry eyes.).     Marland Kitchen nicotine (NICODERM CQ - DOSED IN MG/24 HOURS) 14 mg/24hr patch Place 14 mg onto the skin daily.   Taking  . pantoprazole (PROTONIX) 40 MG tablet Take 40 mg by mouth daily.   Taking  . Propylene Glycol-Glycerin (SOOTHE) 0.6-0.6 % SOLN Place 1-2 drops into both eyes 5 (five) times daily as needed (for dry eyes.). SOOTHE XP     . spironolactone (ALDACTONE) 50 MG tablet Take 25 mg by mouth daily.    Taking  . sucralfate (CARAFATE) 1 GM/10ML suspension Take 1 g by mouth 3 (three) times daily - between meals and at bedtime.    Taking  . vitamin B-12 (CYANOCOBALAMIN) 1000 MCG tablet Take 1,000 mcg by mouth daily.   Taking  . vitamin E 1000 UNIT capsule Take 1,000 Units by mouth daily.   Taking  . zolpidem (AMBIEN) 10 MG tablet Take 10 mg by mouth at bedtime as needed for sleep.   Taking     Allergies  Allergen Reactions  . Erythromycin Anaphylaxis and Rash  . Septra Ds [Sulfamethoxazole-Trimethoprim] Anaphylaxis    Neck swelling as well   . Sulfa Antibiotics Anaphylaxis  . Tetracyclines & Related Hives and Itching  . Demeclocycline   . Latex Rash  Plastic bandaids/ gloves     Past Medical History:  Diagnosis Date  . Breast cancer ([place]) 2012   left breast  . COPD (chronic obstructive pulmonary disease) ([place])   . Deafness in left ear   . Depression   . Diverticulosis   . Ductal carcinoma of left breast ([place]) 2012  . Family history of adverse reaction to anesthesia    mother got sick  . Gastritis   . GERD (gastroesophageal reflux disease)   . H. pylori infection   . H/O herpes labialis   . Hemorrhoids   . History of chicken pox   . Hyperplastic colon polyp   . Hypertension   . Multinodular goiter (nontoxic)   . Osteoporosis   . Panic disorder   . Personal history of radiation therapy   . Recurrent sinusitis   . Recurrent sinusitis    . Tobacco use   . Tubular adenoma of colon     Review of systems:      Physical Exam    Heart and lungs: Regular rate and rhythm without rub or gallop, lungs are bilaterally clear.    HEENT: Normocephalic atraumatic eyes are anicteric    Other:    Pertinant exam for procedure: Soft nontender nondistended bowel sounds positive normoactive.    Planned proceedures: Colonoscopy and indicated procedures. I have discussed the risks benefits and complications of procedures to include not limited to bleeding, infection, perforation and the risk of sedation and the patient wishes to proceed.    Lollie Sails, MD Gastroenterology 08/17/2017  11:56 AM

## 2017-08-17 NOTE — Transfer of Care (Signed)
Immediate Anesthesia Transfer of Care Note  Patient: Katie Franklin  Procedure(s) Performed: COLONOSCOPY WITH PROPOFOL (N/A )  Patient Location: PACU and Endoscopy Unit  Anesthesia Type:General  Level of Consciousness: drowsy  Airway & Oxygen Therapy: Patient Spontanous Breathing and Patient connected to nasal cannula oxygen  Post-op Assessment: Report given to RN and Post -op Vital signs reviewed and stable  Post vital signs: Reviewed and stable  Last Vitals:  Vitals Value Taken Time  BP    Temp    Pulse    Resp    SpO2      Last Pain:  Vitals:   08/17/17 1200  TempSrc: Tympanic  PainSc: 8          Complications: No apparent anesthesia complications

## 2017-08-17 NOTE — Op Note (Signed)
[facility] Patient Name: Katie Franklin Procedure Date: 08/17/2017 12:35 PM MRN: [id number] Account #: [id number] Date of Birth: 06-18-1955 Admit Type: Outpatient Age: 62 Room: [facility] ENDO ROOM 1 Gender: Female Note Status: Finalized Procedure:            Colonoscopy Indications:          Abnormal PET scan of the GI tract Providers:            Lollie Sails, MD Referring MD:         Adrian Prows (Referring MD) Medicines:            Monitored Anesthesia Care Complications:        No immediate complications. Procedure:            Pre-Anesthesia Assessment:                       - ASA Grade Assessment: III - A patient with severe                        systemic disease.                       After obtaining informed consent, the colonoscope was                        passed under direct vision. Throughout the procedure,                        the patient's blood pressure, pulse, and oxygen                        saturations were monitored continuously. The                        Colonoscope was introduced through the anus and                        advanced to the the cecum, identified by appendiceal                        orifice and ileocecal valve. The colonoscopy was                        extremely difficult due to restricted mobility of the                        colon and a tortuous colon. Successful completion of                        the procedure was aided by changing the patient to a                        supine position, changing the patient to a prone                        position, using manual pressure and withdrawing and                        reinserting the scope. The patient tolerated the  procedure well. The quality of the bowel preparation                        was good. Findings:      Multiple small-mouthed diverticula were found in the sigmoid colon and       distal descending colon.      The  digital rectal exam was normal. Impression:           - Diverticulosis in the sigmoid colon and in the distal                        descending colon.                       - No specimens collected. Recommendation:       - Discharge patient to home. Procedure Code(s):    --- Professional ---                       [phone], Colonoscopy, flexible; diagnostic, including                        collection of specimen(s) by brushing or washing, when                        performed (separate procedure) Diagnosis Code(s):    --- Professional ---                       K57.30, Diverticulosis of large intestine without                        perforation or abscess without bleeding                       R93.3, Abnormal findings on diagnostic imaging of other                        parts of digestive tract CPT copyright 2017 American Medical Association. All rights reserved. The codes documented in this report are preliminary and upon coder review may  be revised to meet current compliance requirements. Lollie Sails, MD 08/17/2017 1:59:53 PM This report has been signed electronically. Number of Addenda: 0 Note Initiated On: 08/17/2017 12:35 PM Scope Withdrawal Time: 0 hours 4 minutes 49 seconds  Total Procedure Duration: 1 hour 16 minutes 22 seconds       [facility]

## 2017-08-17 NOTE — Anesthesia Preprocedure Evaluation (Addendum)
Anesthesia Evaluation  Patient identified by MRN, date of birth, ID band Patient awake    Reviewed: Allergy & Precautions, NPO status , Patient's Chart, lab work & pertinent test results  History of Anesthesia Complications Negative for: history of anesthetic complications  Airway Mallampati: II  TM Distance: >3 FB Neck ROM: Full    Dental  (+) Partial Upper   Pulmonary neg sleep apnea, COPD,  COPD inhaler, former smoker,    breath sounds clear to auscultation- rhonchi (-) wheezing      Cardiovascular hypertension, Pt. on medications + Peripheral Vascular Disease  (-) CAD, (-) Past MI, (-) Cardiac Stents and (-) CABG  Rhythm:Regular Rate:Normal - Systolic murmurs and - Diastolic murmurs    Neuro/Psych PSYCHIATRIC DISORDERS Anxiety Depression negative neurological ROS     GI/Hepatic Neg liver ROS, GERD  ,  Endo/Other  neg diabetesHyperthyroidism   Renal/GU negative Renal ROS     Musculoskeletal negative musculoskeletal ROS (+)   Abdominal (+) - obese,   Peds  Hematology negative hematology ROS (+)   Anesthesia Other Findings Past Medical History: 2012: Breast cancer ([place])     Comment:  left breast No date: COPD (chronic obstructive pulmonary disease) (HCC) No date: Deafness in left ear No date: Depression No date: Diverticulosis 2012: Ductal carcinoma of left breast (HCC) No date: Family history of adverse reaction to anesthesia     Comment:  mother got sick No date: Gastritis No date: GERD (gastroesophageal reflux disease) No date: H. pylori infection No date: H/O herpes labialis No date: Hemorrhoids No date: History of chicken pox No date: Hyperplastic colon polyp No date: Hypertension No date: Multinodular goiter (nontoxic) No date: Osteoporosis No date: Panic disorder No date: Personal history of radiation therapy No date: Recurrent sinusitis No date: Recurrent sinusitis No date: Tobacco use No  date: Tubular adenoma of colon   Reproductive/Obstetrics                             Anesthesia Physical Anesthesia Plan  ASA: III  Anesthesia Plan: General   Post-op Pain Management:    Induction: Intravenous  PONV Risk Score and Plan: 2 and Propofol infusion  Airway Management Planned: Natural Airway  Additional Equipment:   Intra-op Plan:   Post-operative Plan:   Informed Consent: I have reviewed the patients History and Physical, chart, labs and discussed the procedure including the risks, benefits and alternatives for the proposed anesthesia with the patient or authorized representative who has indicated his/her understanding and acceptance.   Dental advisory given  Plan Discussed with: CRNA and Anesthesiologist  Anesthesia Plan Comments:         Anesthesia Quick Evaluation

## 2017-08-20 ENCOUNTER — Ambulatory Visit
Admission: RE | Admit: 2017-08-20 | Discharge: 2017-08-20 | Disposition: A | Discharge status: Home / Self Care | Payer: Medicare Other | Source: Ambulatory Visit | Attending: Cardiothoracic Surgery | Admitting: Cardiothoracic Surgery

## 2017-08-20 ENCOUNTER — Other Ambulatory Visit: Payer: Self-pay

## 2017-08-20 ENCOUNTER — Encounter
Admission: RE | Admit: 2017-08-20 | Discharge: 2017-08-20 | Disposition: A | Discharge status: Home / Self Care | Payer: Medicare Other | Source: Ambulatory Visit | Attending: Cardiothoracic Surgery | Admitting: Cardiothoracic Surgery

## 2017-08-20 ENCOUNTER — Encounter: Payer: Self-pay | Admitting: Gastroenterology

## 2017-08-20 DIAGNOSIS — Z01810 Encounter for preprocedural cardiovascular examination: Secondary | ICD-10-CM | POA: Insufficient documentation

## 2017-08-20 DIAGNOSIS — J449 Chronic obstructive pulmonary disease, unspecified: Secondary | ICD-10-CM | POA: Diagnosis not present

## 2017-08-20 DIAGNOSIS — Z01812 Encounter for preprocedural laboratory examination: Secondary | ICD-10-CM | POA: Diagnosis not present

## 2017-08-20 HISTORY — DX: Malignant neoplasm of unspecified part of unspecified bronchus or lung: C34.90

## 2017-08-20 LAB — CBC WITH DIFFERENTIAL/PLATELET
BASOS PCT: 0 %
Basophils Absolute: 0 10*3/uL (ref 0–0.1)
Eosinophils Absolute: 0.1 10*3/uL (ref 0–0.7)
Eosinophils Relative: 1 %
HEMATOCRIT: 41.7 % (ref 35.0–47.0)
HEMOGLOBIN: 14.8 g/dL (ref 12.0–16.0)
LYMPHS ABS: 2.2 10*3/uL (ref 1.0–3.6)
LYMPHS PCT: 36 %
MCH: 33.4 pg (ref 26.0–34.0)
MCHC: 35.5 g/dL (ref 32.0–36.0)
MCV: 94 fL (ref 80.0–100.0)
MONOS PCT: 9 %
Monocytes Absolute: 0.6 10*3/uL (ref 0.2–0.9)
NEUTROS ABS: 3.4 10*3/uL (ref 1.4–6.5)
NEUTROS PCT: 54 %
Platelets: 215 10*3/uL (ref 150–440)
RBC: 4.43 MIL/uL (ref 3.80–5.20)
RDW: 13 % (ref 11.5–14.5)
WBC: 6.3 10*3/uL (ref 3.6–11.0)

## 2017-08-20 LAB — COMPREHENSIVE METABOLIC PANEL
ALBUMIN: 4.5 g/dL (ref 3.5–5.0)
ALK PHOS: 39 U/L (ref 38–126)
ALT: 12 U/L — ABNORMAL LOW (ref 14–54)
ANION GAP: 9 (ref 5–15)
AST: 15 U/L (ref 15–41)
BILIRUBIN TOTAL: 1.5 mg/dL — AB (ref 0.3–1.2)
BUN: 9 mg/dL (ref 6–20)
CALCIUM: 9.5 mg/dL (ref 8.9–10.3)
CO2: 26 mmol/L (ref 22–32)
Chloride: 104 mmol/L (ref 101–111)
Creatinine, Ser: 0.65 mg/dL (ref 0.44–1.00)
GFR calc Af Amer: >60 mL/min (ref >60)
GLUCOSE: 98 mg/dL (ref 65–99)
Potassium: 3.5 mmol/L (ref 3.5–5.1)
Sodium: 139 mmol/L (ref 135–145)
TOTAL PROTEIN: 8 g/dL (ref 6.5–8.1)

## 2017-08-20 LAB — PROTIME-INR
INR: 1.15
Prothrombin Time: 14.6 seconds (ref 11.4–15.2)

## 2017-08-20 LAB — APTT: aPTT: 27 seconds (ref 24–36)

## 2017-08-20 NOTE — Patient Instructions (Signed)
Your procedure is scheduled on: Monday 08/27/17 Report to [place]. To find out your arrival time please call [phone] between 1PM - 3PM on Friday 08/24/17.  Remember: Instructions that are not followed completely may result in serious medical risk, up to and including death, or upon the discretion of your surgeon and anesthesiologist your surgery may need to be rescheduled.     _X__ 1. Do not eat food after midnight the night before your procedure.                 No gum chewing or hard candies. You may drink clear liquids up to 2 hours                 before you are scheduled to arrive for your surgery- DO not drink clear                 liquids within 2 hours of the start of your surgery.                 Clear Liquids include:  water, apple juice without pulp, clear carbohydrate                 drink such as Clearfast or Gatorade, Black Coffee or Tea (Do not add                 anything to coffee or tea).  __X__2.  On the morning of surgery brush your teeth with toothpaste and water, you                 may rinse your mouth with mouthwash if you wish.  Do not swallow any              toothpaste of mouthwash.     _X__ 3.  No Alcohol for 24 hours before or after surgery.   _X__ 4.  Do Not Smoke or use e-cigarettes For 24 Hours Prior to Your Surgery.                 Do not use any chewable tobacco products for at least 6 hours prior to                 surgery.  ____  5.  Bring all medications with you on the day of surgery if instructed.   __X__  6.  Notify your doctor if there is any change in your medical condition      (cold, fever, infections).     Do not wear jewelry, make-up, hairpins, clips or nail polish. Do not wear lotions, powders, or perfumes.  Do not shave 48 hours prior to surgery. Men may shave face and neck. Do not bring valuables to the hospital.    [facility] is not responsible for any belongings or  valuables.  Contacts, dentures/partials or body piercings may not be worn into surgery. Bring a case for your contacts, glasses or hearing aids, a denture cup will be supplied. Leave your suitcase in the car. After surgery it may be brought to your room. For patients admitted to the hospital, discharge time is determined by your treatment team.   Patients discharged the day of surgery will not be allowed to drive home.   Please read over the following fact sheets that you were given:   MRSA Information  __X__ Take these medicines the morning of surgery with A SIP OF WATER:  1. atenolol  2. pantoprazole  3.   4.  5.  6.  ____ Fleet Enema (as directed)   __X__ Use CHG Soap/SAGE wipes as directed  ____ Use inhalers on the day of surgery  ____ Stop metformin/Janumet/Farxiga 2 days prior to surgery    ____ Take 1/2 of usual insulin dose the night before surgery. No insulin the morning          of surgery.   __x__ Stop Blood Thinners Coumadin/Plavix/Xarelto/Pleta/Pradaxa/Eliquis/Effient/Aspirin  on   Or contact your Surgeon, Cardiologist or Medical Doctor regarding  ability to stop your blood thinners  __X__ Stop Anti-inflammatories 7 days before surgery such as Advil, Ibuprofen, Motrin,  BC or Goodies Powder, Naprosyn, Naproxen, Aleve, Aspirin    __X__ Stop all herbal supplements, fish oil or vitamin E until after surgery.    ____ Bring C-Pap to the hospital.

## 2017-08-20 NOTE — Anesthesia Postprocedure Evaluation (Signed)
Anesthesia Post Note  Patient: Katie Franklin  Procedure(s) Performed: COLONOSCOPY WITH PROPOFOL (N/A )  Patient location during evaluation: Endoscopy Anesthesia Type: General Level of consciousness: awake and alert and oriented Pain management: pain level controlled Vital Signs Assessment: post-procedure vital signs reviewed and stable Respiratory status: spontaneous breathing, nonlabored ventilation and respiratory function stable Cardiovascular status: blood pressure returned to baseline and stable Postop Assessment: no signs of nausea or vomiting Anesthetic complications: no     Last Vitals:  Vitals:   08/17/17 1421 08/17/17 1431  BP: 130/81 (!) 122/99  Pulse: (!) 57 (!) 59  Resp: 20 14  Temp:    SpO2: 100% 100%    Last Pain:  Vitals:   08/17/17 1441  TempSrc:   PainSc: 0-No pain                 Andras Grunewald

## 2017-08-21 NOTE — Pre-Procedure Instructions (Signed)
CXR FAXED TO DR Genevive Bi

## 2017-08-26 MED ORDER — CEFAZOLIN SODIUM-DEXTROSE 2-4 GM/100ML-% IV SOLN
2.0000 g | INTRAVENOUS | Status: AC
  Administered 2017-08-27: 2 g via INTRAVENOUS

## 2017-08-27 ENCOUNTER — Other Ambulatory Visit: Payer: Self-pay

## 2017-08-27 ENCOUNTER — Inpatient Hospital Stay: Payer: Medicare Other | Admitting: Anesthesiology

## 2017-08-27 ENCOUNTER — Encounter
Admission: RE | Disposition: A | Discharge status: Home / Self Care | Payer: Self-pay | Source: Home / Self Care | Attending: Cardiothoracic Surgery

## 2017-08-27 ENCOUNTER — Encounter: Payer: Self-pay | Admitting: Anesthesiology

## 2017-08-27 ENCOUNTER — Inpatient Hospital Stay: Payer: Medicare Other

## 2017-08-27 ENCOUNTER — Inpatient Hospital Stay
Admission: RE | Admit: 2017-08-27 | Discharge: 2017-08-30 | DRG: 164 | Disposition: A | Discharge status: Home / Self Care | Payer: Medicare Other | Attending: Cardiothoracic Surgery | Admitting: Cardiothoracic Surgery

## 2017-08-27 DIAGNOSIS — I2510 Atherosclerotic heart disease of native coronary artery without angina pectoris: Secondary | ICD-10-CM | POA: Diagnosis present

## 2017-08-27 DIAGNOSIS — I10 Essential (primary) hypertension: Secondary | ICD-10-CM | POA: Diagnosis present

## 2017-08-27 DIAGNOSIS — Z681 Body mass index (BMI) 19 or less, adult: Secondary | ICD-10-CM

## 2017-08-27 DIAGNOSIS — E440 Moderate protein-calorie malnutrition: Secondary | ICD-10-CM | POA: Diagnosis present

## 2017-08-27 DIAGNOSIS — R918 Other nonspecific abnormal finding of lung field: Secondary | ICD-10-CM

## 2017-08-27 DIAGNOSIS — I739 Peripheral vascular disease, unspecified: Secondary | ICD-10-CM | POA: Diagnosis present

## 2017-08-27 DIAGNOSIS — C3412 Malignant neoplasm of upper lobe, left bronchus or lung: Principal | ICD-10-CM | POA: Diagnosis present

## 2017-08-27 DIAGNOSIS — J449 Chronic obstructive pulmonary disease, unspecified: Secondary | ICD-10-CM | POA: Diagnosis present

## 2017-08-27 DIAGNOSIS — K219 Gastro-esophageal reflux disease without esophagitis: Secondary | ICD-10-CM | POA: Diagnosis present

## 2017-08-27 DIAGNOSIS — Z87891 Personal history of nicotine dependence: Secondary | ICD-10-CM

## 2017-08-27 DIAGNOSIS — Z09 Encounter for follow-up examination after completed treatment for conditions other than malignant neoplasm: Secondary | ICD-10-CM

## 2017-08-27 HISTORY — PX: FLEXIBLE BRONCHOSCOPY: SHX5094

## 2017-08-27 HISTORY — PX: THORACOTOMY: SHX5074

## 2017-08-27 LAB — GLUCOSE, CAPILLARY
GLUCOSE-CAPILLARY: 151 mg/dL — AB (ref 65–99)
Glucose-Capillary: 93 mg/dL (ref 65–99)

## 2017-08-27 LAB — MRSA PCR SCREENING: MRSA by PCR: NEGATIVE

## 2017-08-27 SURGERY — BRONCHOSCOPY, FLEXIBLE
Anesthesia: General | Wound class: Clean Contaminated

## 2017-08-27 MED ORDER — LACTATED RINGERS IV SOLN
INTRAVENOUS | Status: DC
  Administered 2017-08-27: 09:00:00 via INTRAVENOUS

## 2017-08-27 MED ORDER — ONDANSETRON HCL 4 MG/2ML IJ SOLN
INTRAMUSCULAR | Status: DC | PRN
  Administered 2017-08-27: 4 mg via INTRAVENOUS

## 2017-08-27 MED ORDER — SODIUM CHLORIDE 0.9 % IV SOLN
INTRAVENOUS | Status: DC | PRN
  Administered 2017-08-27: 70 mL

## 2017-08-27 MED ORDER — ONDANSETRON HCL 4 MG/2ML IJ SOLN
4.0000 mg | Freq: Four times a day (QID) | INTRAMUSCULAR | Status: DC | PRN
  Administered 2017-08-27 – 2017-08-29 (×3): 4 mg via INTRAVENOUS
  Filled 2017-08-27 (×3): qty 2

## 2017-08-27 MED ORDER — CEFAZOLIN SODIUM-DEXTROSE 2-4 GM/100ML-% IV SOLN
INTRAVENOUS | Status: AC
  Filled 2017-08-27: qty 100

## 2017-08-27 MED ORDER — DEXTROSE-NACL 5-0.45 % IV SOLN
INTRAVENOUS | Status: DC
  Administered 2017-08-27 – 2017-08-29 (×4): via INTRAVENOUS

## 2017-08-27 MED ORDER — CHLORHEXIDINE GLUCONATE CLOTH 2 % EX PADS: 6.0000 | MEDICATED_PAD | Freq: Once | CUTANEOUS | Status: DC

## 2017-08-27 MED ORDER — ACETAMINOPHEN 10 MG/ML IV SOLN
INTRAVENOUS | Status: DC | PRN
  Administered 2017-08-27: 1000 mg via INTRAVENOUS

## 2017-08-27 MED ORDER — SODIUM CHLORIDE FLUSH 0.9 % IV SOLN
INTRAVENOUS | Status: AC
  Filled 2017-08-27: qty 20

## 2017-08-27 MED ORDER — LIFITEGRAST 5 % OP SOLN
1.0000 [drp] | Freq: Two times a day (BID) | OPHTHALMIC | Status: DC | PRN
Start: 2017-08-27 — End: 2017-08-30

## 2017-08-27 MED ORDER — OXYCODONE-ACETAMINOPHEN 5-325 MG PO TABS
1.0000 | ORAL_TABLET | ORAL | Status: DC | PRN
  Administered 2017-08-27: 1 via ORAL
  Filled 2017-08-27: qty 1

## 2017-08-27 MED ORDER — FENTANYL CITRATE (PF) 250 MCG/5ML IJ SOLN
INTRAMUSCULAR | Status: AC
  Filled 2017-08-27: qty 5

## 2017-08-27 MED ORDER — NICOTINE 14 MG/24HR TD PT24
14.0000 mg | MEDICATED_PATCH | Freq: Every day | TRANSDERMAL | Status: DC
Start: 2017-08-27 — End: 2017-08-30
  Filled 2017-08-27 (×3): qty 1

## 2017-08-27 MED ORDER — LIDOCAINE HCL (CARDIAC) PF 100 MG/5ML IV SOSY
PREFILLED_SYRINGE | INTRAVENOUS | Status: DC | PRN
  Administered 2017-08-27: 50 mg via INTRAVENOUS

## 2017-08-27 MED ORDER — CEFAZOLIN SODIUM-DEXTROSE 2-4 GM/100ML-% IV SOLN
2.0000 g | Freq: Three times a day (TID) | INTRAVENOUS | Status: AC
  Administered 2017-08-27 – 2017-08-28 (×2): 2 g via INTRAVENOUS
  Filled 2017-08-27 (×2): qty 100

## 2017-08-27 MED ORDER — SODIUM CHLORIDE 0.9 % IJ SOLN
INTRAMUSCULAR | Status: AC
  Filled 2017-08-27: qty 10

## 2017-08-27 MED ORDER — LIDOCAINE HCL (PF) 2 % IJ SOLN
INTRAMUSCULAR | Status: AC
  Filled 2017-08-27: qty 10

## 2017-08-27 MED ORDER — FENTANYL CITRATE (PF) 100 MCG/2ML IJ SOLN
INTRAMUSCULAR | Status: AC
  Administered 2017-08-27: 25 ug via INTRAVENOUS
  Filled 2017-08-27: qty 2

## 2017-08-27 MED ORDER — SUGAMMADEX SODIUM 200 MG/2ML IV SOLN
INTRAVENOUS | Status: DC | PRN
  Administered 2017-08-27: 100 mg via INTRAVENOUS

## 2017-08-27 MED ORDER — POLYVINYL ALCOHOL 1.4 % OP SOLN
1.0000 [drp] | Freq: Every day | OPHTHALMIC | Status: DC | PRN
  Filled 2017-08-27: qty 15

## 2017-08-27 MED ORDER — SPIRONOLACTONE 25 MG PO TABS
25.0000 mg | ORAL_TABLET | Freq: Every day | ORAL | Status: DC
  Administered 2017-08-28 – 2017-08-30 (×3): 25 mg via ORAL
  Filled 2017-08-27 (×3): qty 1

## 2017-08-27 MED ORDER — PANTOPRAZOLE SODIUM 40 MG PO TBEC
40.0000 mg | DELAYED_RELEASE_TABLET | Freq: Every day | ORAL | Status: DC
  Administered 2017-08-28 – 2017-08-30 (×3): 40 mg via ORAL
  Filled 2017-08-27 (×3): qty 1

## 2017-08-27 MED ORDER — VITAMIN E 45 MG (100 UNIT) PO CAPS
1000.0000 [IU] | ORAL_CAPSULE | Freq: Every day | ORAL | Status: DC
  Administered 2017-08-28 – 2017-08-30 (×3): 1000 [IU] via ORAL
  Filled 2017-08-27 (×4): qty 2

## 2017-08-27 MED ORDER — SUGAMMADEX SODIUM 200 MG/2ML IV SOLN
INTRAVENOUS | Status: AC
  Filled 2017-08-27: qty 2

## 2017-08-27 MED ORDER — SODIUM CHLORIDE 0.9 % IJ SOLN: INTRAMUSCULAR | Status: DC | PRN

## 2017-08-27 MED ORDER — ONDANSETRON HCL 4 MG/2ML IJ SOLN: 4.0000 mg | Freq: Once | INTRAMUSCULAR | Status: DC | PRN

## 2017-08-27 MED ORDER — DICYCLOMINE HCL 10 MG PO CAPS
10.0000 mg | ORAL_CAPSULE | Freq: Four times a day (QID) | ORAL | Status: DC | PRN
  Administered 2017-08-29: 10 mg via ORAL
  Filled 2017-08-27 (×2): qty 1

## 2017-08-27 MED ORDER — OXYCODONE HCL 5 MG PO TABS: 5.0000 mg | ORAL_TABLET | ORAL | Status: DC | PRN

## 2017-08-27 MED ORDER — MORPHINE SULFATE (PF) 2 MG/ML IV SOLN: 1.0000 mg | INTRAVENOUS | Status: DC | PRN

## 2017-08-27 MED ORDER — CHOLESTYRAMINE 4 G PO PACK
2.0000 g | PACK | Freq: Every day | ORAL | Status: DC | PRN
  Filled 2017-08-27: qty 1

## 2017-08-27 MED ORDER — ROCURONIUM BROMIDE 50 MG/5ML IV SOLN
INTRAVENOUS | Status: AC
  Filled 2017-08-27: qty 1

## 2017-08-27 MED ORDER — BISACODYL 5 MG PO TBEC
10.0000 mg | DELAYED_RELEASE_TABLET | Freq: Every day | ORAL | Status: DC
  Administered 2017-08-29 – 2017-08-30 (×2): 10 mg via ORAL
  Filled 2017-08-27 (×2): qty 2

## 2017-08-27 MED ORDER — ALPRAZOLAM 0.25 MG PO TABS
0.2500 mg | ORAL_TABLET | Freq: Three times a day (TID) | ORAL | Status: DC | PRN
  Administered 2017-08-28 – 2017-08-29 (×4): 0.25 mg via ORAL
  Filled 2017-08-27 (×4): qty 1

## 2017-08-27 MED ORDER — ONDANSETRON HCL 4 MG/2ML IJ SOLN
INTRAMUSCULAR | Status: AC
  Filled 2017-08-27: qty 2

## 2017-08-27 MED ORDER — PROPOFOL 10 MG/ML IV BOLUS
INTRAVENOUS | Status: AC
  Filled 2017-08-27: qty 20

## 2017-08-27 MED ORDER — PHENYLEPHRINE HCL 10 MG/ML IJ SOLN
INTRAMUSCULAR | Status: DC | PRN
  Administered 2017-08-27 (×3): 50 ug via INTRAVENOUS

## 2017-08-27 MED ORDER — ALBUTEROL SULFATE (2.5 MG/3ML) 0.083% IN NEBU
2.5000 mg | INHALATION_SOLUTION | RESPIRATORY_TRACT | Status: DC
  Administered 2017-08-27 – 2017-08-29 (×6): 2.5 mg via RESPIRATORY_TRACT
  Filled 2017-08-27 (×6): qty 3

## 2017-08-27 MED ORDER — MIDAZOLAM HCL 2 MG/2ML IJ SOLN
INTRAMUSCULAR | Status: AC
  Filled 2017-08-27: qty 2

## 2017-08-27 MED ORDER — CEFAZOLIN SODIUM-DEXTROSE 2-4 GM/100ML-% IV SOLN: 2.0000 g | INTRAVENOUS | Status: DC

## 2017-08-27 MED ORDER — BUPIVACAINE HCL (PF) 0.25 % IJ SOLN
INTRAMUSCULAR | Status: DC | PRN
  Administered 2017-08-27: 30 mL

## 2017-08-27 MED ORDER — SODIUM CHLORIDE FLUSH 0.9 % IV SOLN
INTRAVENOUS | Status: AC
  Filled 2017-08-27: qty 30

## 2017-08-27 MED ORDER — FENTANYL CITRATE (PF) 100 MCG/2ML IJ SOLN
25.0000 ug | INTRAMUSCULAR | Status: AC | PRN
  Administered 2017-08-27 (×6): 25 ug via INTRAVENOUS

## 2017-08-27 MED ORDER — TRAMADOL HCL 50 MG PO TABS
50.0000 mg | ORAL_TABLET | Freq: Four times a day (QID) | ORAL | Status: DC
  Administered 2017-08-27 – 2017-08-30 (×13): 50 mg via ORAL
  Filled 2017-08-27 (×14): qty 1

## 2017-08-27 MED ORDER — SUCRALFATE 1 GM/10ML PO SUSP
1.0000 g | Freq: Two times a day (BID) | ORAL | Status: DC
  Administered 2017-08-27 – 2017-08-30 (×9): 1 g via ORAL
  Filled 2017-08-27 (×10): qty 10

## 2017-08-27 MED ORDER — VITAMIN B-12 1000 MCG PO TABS
1000.0000 ug | ORAL_TABLET | Freq: Every day | ORAL | Status: DC
  Administered 2017-08-28 – 2017-08-30 (×3): 1000 ug via ORAL
  Filled 2017-08-27 (×3): qty 1

## 2017-08-27 MED ORDER — BUPIVACAINE LIPOSOME 1.3 % IJ SUSP
INTRAMUSCULAR | Status: AC
  Filled 2017-08-27: qty 20

## 2017-08-27 MED ORDER — EPHEDRINE SULFATE 50 MG/ML IJ SOLN
INTRAMUSCULAR | Status: AC
  Filled 2017-08-27: qty 1

## 2017-08-27 MED ORDER — ACETAMINOPHEN 10 MG/ML IV SOLN
INTRAVENOUS | Status: AC
  Filled 2017-08-27: qty 100

## 2017-08-27 MED ORDER — ROCURONIUM BROMIDE 100 MG/10ML IV SOLN
INTRAVENOUS | Status: DC | PRN
  Administered 2017-08-27: 10 mg via INTRAVENOUS
  Administered 2017-08-27: 50 mg via INTRAVENOUS
  Administered 2017-08-27: 5 mg via INTRAVENOUS

## 2017-08-27 MED ORDER — MIDAZOLAM HCL 2 MG/2ML IJ SOLN
INTRAMUSCULAR | Status: DC | PRN
  Administered 2017-08-27 (×2): 1 mg via INTRAVENOUS

## 2017-08-27 MED ORDER — PHENYLEPHRINE HCL 10 MG/ML IJ SOLN
INTRAMUSCULAR | Status: AC
  Filled 2017-08-27: qty 1

## 2017-08-27 MED ORDER — EPHEDRINE SULFATE 50 MG/ML IJ SOLN
INTRAMUSCULAR | Status: DC | PRN
  Administered 2017-08-27 (×3): 5 mg via INTRAVENOUS

## 2017-08-27 MED ORDER — FENTANYL CITRATE (PF) 100 MCG/2ML IJ SOLN
INTRAMUSCULAR | Status: DC | PRN
  Administered 2017-08-27: 100 ug via INTRAVENOUS
  Administered 2017-08-27: 25 ug via INTRAVENOUS
  Administered 2017-08-27: 50 ug via INTRAVENOUS
  Administered 2017-08-27 (×2): 25 ug via INTRAVENOUS

## 2017-08-27 MED ORDER — ATENOLOL 50 MG PO TABS
50.0000 mg | ORAL_TABLET | Freq: Every day | ORAL | Status: DC
  Administered 2017-08-28 – 2017-08-30 (×3): 50 mg via ORAL
  Filled 2017-08-27 (×2): qty 1
  Filled 2017-08-27: qty 2

## 2017-08-27 MED ORDER — BUPIVACAINE HCL (PF) 0.25 % IJ SOLN
INTRAMUSCULAR | Status: AC
  Filled 2017-08-27: qty 30

## 2017-08-27 MED ORDER — PHENYLEPHRINE HCL 10 MG/ML IJ SOLN
INTRAMUSCULAR | Status: DC | PRN
  Administered 2017-08-27: 10 ug/min via INTRAVENOUS

## 2017-08-27 MED ORDER — PROPOFOL 10 MG/ML IV BOLUS
INTRAVENOUS | Status: DC | PRN
  Administered 2017-08-27: 90 mg via INTRAVENOUS

## 2017-08-27 SURGICAL SUPPLY — 73 items
APPLIER CLIP 13 LRG OPEN (CLIP) ×3
BENZOIN TINCTURE PRP APPL 2/3 (GAUZE/BANDAGES/DRESSINGS) IMPLANT
BNDG COHESIVE 4X5 TAN STRL (GAUZE/BANDAGES/DRESSINGS) IMPLANT
BRONCHOSCOPE PED SLIM DISP (MISCELLANEOUS) ×3 IMPLANT
BULB RESERV EVAC DRAIN JP 100C (MISCELLANEOUS) ×3 IMPLANT
CANISTER SUCT 1200ML W/VALVE (MISCELLANEOUS) ×3 IMPLANT
CATH THOR STR 28F  SOFT WA (CATHETERS) ×1
CATH THOR STR 28F SOFT WA (CATHETERS) ×2 IMPLANT
CATH URET ROBINSON 16FR STRL (CATHETERS) IMPLANT
CHLORAPREP W/TINT 26ML (MISCELLANEOUS) ×6 IMPLANT
CLIP APPLIE 13 LRG OPEN (CLIP) ×2 IMPLANT
CNTNR SPEC 2.5X3XGRAD LEK (MISCELLANEOUS) ×2
CONT SPEC 4OZ STER OR WHT (MISCELLANEOUS) ×1
CONTAINER SPEC 2.5X3XGRAD LEK (MISCELLANEOUS) ×2 IMPLANT
CUTTER ECHEON FLEX ENDO 45 340 (ENDOMECHANICALS) ×3 IMPLANT
DECANTER SPIKE VIAL GLASS SM (MISCELLANEOUS) ×3 IMPLANT
DRAIN CHANNEL 19F RND (DRAIN) ×3 IMPLANT
DRAIN CHEST DRY SUCT SGL (MISCELLANEOUS) ×3 IMPLANT
DRAPE C-SECTION (MISCELLANEOUS) ×3 IMPLANT
DRAPE MAG INST 16X20 L/F (DRAPES) ×3 IMPLANT
DRSG OPSITE POSTOP 4X6 (GAUZE/BANDAGES/DRESSINGS) IMPLANT
DRSG OPSITE POSTOP 4X8 (GAUZE/BANDAGES/DRESSINGS) IMPLANT
DRSG TELFA 3X8 NADH (GAUZE/BANDAGES/DRESSINGS) IMPLANT
ELECT BLADE 6.5 EXT (BLADE) ×3 IMPLANT
ELECT CAUTERY BLADE TIP 2.5 (TIP) ×3
ELECT REM PT RETURN 9FT ADLT (ELECTROSURGICAL) ×3
ELECTRODE CAUTERY BLDE TIP 2.5 (TIP) ×2 IMPLANT
ELECTRODE REM PT RTRN 9FT ADLT (ELECTROSURGICAL) ×2 IMPLANT
GAUZE SPONGE 4X4 12PLY STRL (GAUZE/BANDAGES/DRESSINGS) ×3 IMPLANT
GLOVE SURG SYN 7.5  E (GLOVE) ×2
GLOVE SURG SYN 7.5 E (GLOVE) ×4 IMPLANT
GOWN STRL REUS W/ TWL LRG LVL3 (GOWN DISPOSABLE) ×6 IMPLANT
GOWN STRL REUS W/TWL LRG LVL3 (GOWN DISPOSABLE) ×3
KIT TURNOVER KIT A (KITS) ×3 IMPLANT
LABEL OR SOLS (LABEL) ×3 IMPLANT
LOOP RED MAXI  1X406MM (MISCELLANEOUS) ×1
LOOP VESSEL MAXI 1X406 RED (MISCELLANEOUS) ×2 IMPLANT
MARKER SKIN DUAL TIP RULER LAB (MISCELLANEOUS) ×3 IMPLANT
NEEDLE SPNL 20GX3.5 QUINCKE YW (NEEDLE) ×3 IMPLANT
PACK BASIN MAJOR ARMC (MISCELLANEOUS) ×3 IMPLANT
RELOAD STAPLER LINE PROX 30 GR (STAPLE) IMPLANT
SPONGE KITTNER 5P (MISCELLANEOUS) ×3 IMPLANT
STAPLE RELOAD 45MM GOLD (STAPLE) ×12 IMPLANT
STAPLER RELOAD LINE PROX 30 GR (STAPLE)
STAPLER SKIN PROX 35W (STAPLE) IMPLANT
STAPLER VASCULAR ECHELON 35 (CUTTER) IMPLANT
STRIP CLOSURE SKIN 1/2X4 (GAUZE/BANDAGES/DRESSINGS) ×3 IMPLANT
SUT ETHILON 4-0 (SUTURE) ×1
SUT ETHILON 4-0 FS2 18XMFL BLK (SUTURE) ×2
SUT MNCRL 4-0 (SUTURE) ×1
SUT MNCRL 4-0 27XMFL (SUTURE) ×2
SUT MNCRL AB 3-0 PS2 27 (SUTURE) IMPLANT
SUT PROLENE 5 0 RB 1 DA (SUTURE) IMPLANT
SUT SILK 0 (SUTURE) ×1
SUT SILK 0 30XBRD TIE 6 (SUTURE) ×2 IMPLANT
SUT SILK 1 SH (SUTURE) ×18 IMPLANT
SUT VIC AB 0 CT1 36 (SUTURE) ×6 IMPLANT
SUT VIC AB 2-0 CT1 27 (SUTURE) ×2
SUT VIC AB 2-0 CT1 TAPERPNT 27 (SUTURE) ×4 IMPLANT
SUT VICRYL 2 TP 1 (SUTURE) ×9 IMPLANT
SUTURE ETHLN 4-0 FS2 18XMF BLK (SUTURE) ×2 IMPLANT
SUTURE MNCRL 4-0 27XMF (SUTURE) ×2 IMPLANT
SYR 10ML SLIP (SYRINGE) IMPLANT
SYR 20CC LL (SYRINGE) ×3 IMPLANT
SYR BULB IRRIG 60ML STRL (SYRINGE) ×3 IMPLANT
TAPE ADH 3 LX (MISCELLANEOUS) ×3 IMPLANT
TAPE TRANSPORE STRL 2 31045 (GAUZE/BANDAGES/DRESSINGS) ×3 IMPLANT
TRAY FOLEY CATH SILVER 16FR LF (SET/KITS/TRAYS/PACK) ×3 IMPLANT
TROCAR FLEXIPATH 20X80 (ENDOMECHANICALS) IMPLANT
TROCAR FLEXIPATH THORACIC 15MM (ENDOMECHANICALS) IMPLANT
TUBING CONNECTING 10 (TUBING) IMPLANT
WATER STERILE IRR 1000ML POUR (IV SOLUTION) ×6 IMPLANT
YANKAUER SUCT BULB TIP FLEX NO (MISCELLANEOUS) ×3 IMPLANT

## 2017-08-27 NOTE — Anesthesia Post-op Follow-up Note (Signed)
Anesthesia QCDR form completed.        

## 2017-08-27 NOTE — Transfer of Care (Signed)
Immediate Anesthesia Transfer of Care Note  Patient: Katie Franklin  Procedure(s) Performed: PREOP BRONCHOSCOPY (N/A ) THORACOTOMY MAJOR (Left )  Patient Location: PACU  Anesthesia Type:General  Level of Consciousness: sedated and responds to stimulation  Airway & Oxygen Therapy: Patient Spontanous Breathing and Patient connected to face mask oxygen  Post-op Assessment: Report given to RN and Post -op Vital signs reviewed and stable  Post vital signs: Reviewed and stable  Last Vitals:  Vitals Value Taken Time  BP 119/68 08/27/2017  1:04 PM  Temp    Pulse 63 08/27/2017  1:04 PM  Resp 18 08/27/2017  1:04 PM  SpO2 100 % 08/27/2017  1:04 PM  Vitals shown include unvalidated device data.  Last Pain:  Vitals:   08/27/17 0830  TempSrc: Tympanic  PainSc: 5          Complications: No apparent anesthesia complications

## 2017-08-27 NOTE — Anesthesia Preprocedure Evaluation (Addendum)
Anesthesia Evaluation  Patient identified by MRN, date of birth, ID band Patient awake    Reviewed: Allergy & Precautions, NPO status , Patient's Chart, lab work & pertinent test results, reviewed documented beta blocker date and time   History of Anesthesia Complications (+) Family history of anesthesia reaction  Airway Mallampati: II  TM Distance: >3 FB     Dental  (+) Chipped, Partial Upper   Pulmonary COPD, former smoker,           Cardiovascular hypertension, Pt. on medications and Pt. on home beta blockers + CAD and + Peripheral Vascular Disease       Neuro/Psych PSYCHIATRIC DISORDERS Anxiety Depression    GI/Hepatic GERD  Controlled,  Endo/Other  Hyperthyroidism   Renal/GU      Musculoskeletal   Abdominal   Peds  Hematology   Anesthesia Other Findings Lung ca. EKG 2018 ok.  Reproductive/Obstetrics                            Anesthesia Physical Anesthesia Plan  ASA: III  Anesthesia Plan: General   Post-op Pain Management:    Induction: Intravenous  PONV Risk Score and Plan:   Airway Management Planned: Double Lumen EBT  Additional Equipment:   Intra-op Plan:   Post-operative Plan:   Informed Consent: I have reviewed the patients History and Physical, chart, labs and discussed the procedure including the risks, benefits and alternatives for the proposed anesthesia with the patient or authorized representative who has indicated his/her understanding and acceptance.     Plan Discussed with: CRNA  Anesthesia Plan Comments:         Anesthesia Quick Evaluation

## 2017-08-27 NOTE — Interval H&P Note (Signed)
History and Physical Interval Note:  08/27/2017 9:31 AM  Katie Franklin  has presented today for surgery, with the diagnosis of LEFT UPPER LOBE MASS  The various methods of treatment have been discussed with the patient and family. After consideration of risks, benefits and other options for treatment, the patient has consented to  Procedure(s): PREOP BRONCHOSCOPY (N/A) THORACOTOMY MAJOR (Left) as a surgical intervention .  The patient's history has been reviewed, patient examined, no change in status, stable for surgery.  I have reviewed the patient's chart and labs.  Questions were answered to the patient's satisfaction.     Nestor Lewandowsky

## 2017-08-27 NOTE — Op Note (Signed)
08/27/2017  1:06 PM  PATIENT:  Katie Franklin  62 y.o. female  PRE-OPERATIVE DIAGNOSIS: Left upper lobe mass  POST-OPERATIVE DIAGNOSIS: Adenocarcinoma left upper lobe  PROCEDURE: #1 preoperative bronchoscopy to assess endobronchial anatomy #2 right thoracotomy (muscle-sparing) with wedge resection left upper lobe  SURGEON:  Surgeon(s) and Role:    * Nestor Lewandowsky, MD - Primary    * Pabon, Marjory Lies, MD - Assisting  ASSISTANTS: Dr. Marlis Edelson.  An assistant was required for this case due to the complexity of the procedure and the lack of any available other assistance.  ANESTHESIA: General endotracheal  INDICATIONS FOR PROCEDURE this patient is a 62 year old woman with a previous history of breast cancer and a newly diagnosed left upper lobe mass.  Left upper lobe mass was extensively evaluated and was felt to be most consistent with a lung cancer.  She does have a long history of smoking and she was offered the above named procedure for definitive diagnosis and treatment.  An extensive discussion was had with the patient prior to surgery regarding the indications and risks of wedge resection or lobectomy.  She elected to proceed with a wedge resection if possible.  DICTATION: The patient is brought to the operating suite and placed in supine position.  General endotracheal anesthesia was given through a double-lumen tube.  Preoperative bronchoscopy was carried out and was normal to the subsegmental levels bilaterally.  The tube was positioned in the left mainstem bronchus and the patient was then turned for left thoracotomy.  All pressure points were carefully padded.  The patient was prepped and draped in usual sterile fashion.  A muscle-sparing lateral thoracotomy was performed.  The serratus and latissimus muscles were retracted posteriorly and anteriorly.  The chest was entered.  There were some adhesions at the very apex of the lung and a thoracoscopic port was created in the inferior  aspect of the chest and the thoracoscope was positioned to give [place] visualization at the apex.  It did not appear to be related to the tumor itself involving the chest wall but rather these were some diffuse adhesions that were easily taken down with electrocautery.  Once the upper lobe was freed we could then easily palpate the tumor.  It did not appear to be violating the visceral pleura but this whole area was where it had been adherent to the chest wall.  Through our most inferior access port we placed the stapler and fired multiple rounds with the endoscopic stapler with at least a 2 cm margin.  The tumor itself measured approximately 1 cm.  I was very pleased with the margins we were able to obtain and we then placed a single chest tube through our port site in place it to the apex of the chest.  We attempted to take multiple biopsies of the chest wall after the frozen section was returned.  The frozen section was consistent with an adenocarcinoma although the pathologist could not say whether or not the visceral pleura was involved.  Grossly they did not believe so.  We took some random samples of the chest wall where the tumor was located and we then placed some large clips in that area along the parietal pleura as well.  Hemostasis was complete and the chest was then closed.  #2 Vicryl pericostal sutures were used to approximate the ribs.  The lung was inflated and the chest tube was positioned anteriorly.  A 28 French tube was positioned through our anterior port site.  The muscles of the chest wall were allowed to return to their normal anatomic position.  A 19 Blake was placed in the subcutaneous tissues and the skin and subcutaneous tissues were closed with running Vicryl sutures.  The access incision was closed with a running Vicryl suture and with nylon for the skin.  Sterile dressings were applied.  The patient was then extubated and taken to the recovery room in stable condition.   Nestor Lewandowsky, MD

## 2017-08-27 NOTE — Anesthesia Procedure Notes (Signed)
Procedure Name: Intubation Performed by: Lance Muss, CRNA Pre-anesthesia Checklist: Patient identified, Patient being monitored, Timeout performed, Emergency Drugs available and Suction available Patient Re-evaluated:Patient Re-evaluated prior to induction Oxygen Delivery Method: Circle system utilized Preoxygenation: Pre-oxygenation with 100% oxygen Induction Type: IV induction Ventilation: Mask ventilation without difficulty Laryngoscope Size: Mac and 3 Grade View: Grade I Tube type: Oral Endobronchial tube: Left, Double lumen EBT, EBT position confirmed by fiberoptic bronchoscope and EBT position confirmed by auscultation and 35 Fr Number of attempts: 2 Airway Equipment and Method: Stylet Placement Confirmation: ETT inserted through vocal cords under direct vision,  positive ETCO2 and breath sounds checked- equal and bilateral Tube secured with: Tape Dental Injury: Teeth and Oropharynx as per pre-operative assessment

## 2017-08-27 NOTE — Anesthesia Postprocedure Evaluation (Signed)
Anesthesia Post Note  Patient: Katie Franklin  Procedure(s) Performed: PREOP BRONCHOSCOPY (N/A ) THORACOTOMY MAJOR (Left )  Patient location during evaluation: PACU Anesthesia Type: General Level of consciousness: awake and alert Pain management: pain level controlled Vital Signs Assessment: post-procedure vital signs reviewed and stable Respiratory status: spontaneous breathing, nonlabored ventilation, respiratory function stable and patient connected to nasal cannula oxygen Cardiovascular status: blood pressure returned to baseline and stable Postop Assessment: no apparent nausea or vomiting Anesthetic complications: no     Last Vitals:  Vitals:   08/27/17 1352 08/27/17 1353  BP:  (!) 109/59  Pulse: 62 62  Resp: 17 (!) 27  Temp:    SpO2: 100% 100%    Last Pain:  Vitals:   08/27/17 1344  TempSrc:   PainSc: [place]

## 2017-08-28 ENCOUNTER — Encounter: Payer: Self-pay | Admitting: Cardiothoracic Surgery

## 2017-08-28 DIAGNOSIS — E440 Moderate protein-calorie malnutrition: Secondary | ICD-10-CM

## 2017-08-28 LAB — TYPE AND SCREEN
ABO/RH(D): O POS
Antibody Screen: NEGATIVE
UNIT DIVISION: 0
Unit division: 0

## 2017-08-28 LAB — BPAM RBC
BLOOD PRODUCT EXPIRATION DATE: 201906012359
Blood Product Expiration Date: 201906012359
UNIT TYPE AND RH: 5100
Unit Type and Rh: 5100

## 2017-08-28 LAB — PREPARE RBC (CROSSMATCH)

## 2017-08-28 MED ORDER — ADULT MULTIVITAMIN W/MINERALS CH
1.0000 | ORAL_TABLET | Freq: Every day | ORAL | Status: DC
  Administered 2017-08-28 – 2017-08-30 (×3): 1 via ORAL
  Filled 2017-08-28 (×3): qty 1

## 2017-08-28 MED ORDER — PREMIER PROTEIN SHAKE
11.0000 [oz_av] | Freq: Two times a day (BID) | ORAL | Status: DC
  Administered 2017-08-28 – 2017-08-30 (×4): 11 [oz_av] via ORAL

## 2017-08-28 MED ORDER — OXYCODONE-ACETAMINOPHEN 5-325 MG PO TABS: 1.0000 | ORAL_TABLET | ORAL | Status: DC | PRN

## 2017-08-28 NOTE — Progress Notes (Addendum)
Initial Nutrition Assessment  DOCUMENTATION CODES:   Non-severe (moderate) malnutrition in context of chronic illness, Underweight  INTERVENTION:  Provide Premier Protein po BID, each supplement provides 160 kcal and 30 grams of protein. Patient prefers vanilla.  Provide daily MVI.  Discussed nutritional strategies for managing N/V including avoiding foods that have strong odors, taking bites of easy-to-digest foods (crackers, pretzels) and sips of caffeine-free liquids throughout the day, and eating smaller meals more often throughout the day.  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  GOAL:   Patient will meet greater than or equal to 90% of their needs, Weight gain(Gain weight back to UBW of 118-120 lbs over time)  MONITOR:   PO intake, Supplement acceptance, Labs, Weight trends, Skin, I & O's  REASON FOR ASSESSMENT:   Other (Comment)(Low BMI)    ASSESSMENT:   62 year old female with PMHx of gastritis, HTN, COPD, multinodular goiter, GERD, OP, deafness in left ear, hemorrhoids, diverticulosis, hx adenomatous colon polyps, hx stage I breast cancer s/p left partial mastectomy and XRT, recent cholecystectomy on 06/11/2017 who was admitted with left upper lobe mass that was found through screening CT scan now s/p right muscle-sparing thoracotomy with wedge resection of left upper lobe on 5/13 (post-op diagnosis adenocarcinoma of left upper lobe, pending surgical pathology).   Met with patient and her spouse at bedside. Per chart she has a chest tube and JP drain. Patient reports she has a poor appetite here. She has been experiencing post-prandial N/V. She denies any abdominal pain. This morning she only had a few bites of scrambled eggs but did not like the food. Her spouse brought her in her favorite instant  grits from home, hard-boiled egg whites, and a bottle of Premier Protein, which are foods she enjoys better. She reports that PTA she had a better appetite, but has been losing weight. She reports that back in February she had a cholecystectomy and has had dumping syndrome since then. She has been trying to eat a lower-fat diet, while at the same time consuming adequate calories and protein to try to gain her weight back. She does not like Ensure or Boost and prefers Premier Protein even though it has less calories.  UBW 118-120 lbs. There is a fairly limited weight history in chart. It appears like the only documented weight of 118 lbs was on 08/28/2014, and it appears to be a stated weight, not a true measured weight. She was 105.5 lbs on 06/30/2016 and 98.5 lbs on 08/02/2017. That is a weight loss of 7 lbs (6.6% body weight) over the past year, which is not significant for time frame. It is important to note that patient is underweight and gaining weight back to 118-120 lbs would allow her to be a "normal weight" for her height.  Medications reviewed and include: Dulcolax, nicotine patch (per discussion in rounds pt does not want this anymore), pantoprazole, spironolactone 25 mg daily PO, Carafate 1 gram BID and QHS, vitamin B12 1000 micrograms daily, vitamin E 1000 units daily, D5-1/2NS at 75 mL/hr (90 grams dextrose, 306 kcal daily).  Labs reviewed: CBG 93-151. Could not find any labs in chart or Care Everywhere related to vitamin B12 or vitamin E levels. Noted she did previously have a low vitamin D level, but on her most recent check on 09/12/2016 it was WNL.  I/O: 865  mL UOP yesterday; 75 mL output from left JP drain; 155 mL output from chest tube  Discussed with RN and on rounds. Also discussed with Dr. Genevive Bi. Okay to order Premier Protein and MVI for patient.  NUTRITION - FOCUSED PHYSICAL EXAM:    Most Recent Value  Orbital Region  Moderate depletion  Upper Arm Region  Severe depletion   Thoracic and Lumbar Region  Moderate depletion  Buccal Region  Moderate depletion  Temple Region  Moderate depletion  Clavicle Bone Region  Moderate depletion  Clavicle and Acromion Bone Region  Moderate depletion  Scapular Bone Region  Moderate depletion  Dorsal Hand  Mild depletion  Patellar Region  Moderate depletion  Anterior Thigh Region  Moderate depletion  Posterior Calf Region  Moderate depletion  Edema (RD Assessment)  None  Hair  Reviewed  Eyes  Reviewed  Mouth  Reviewed  Skin  Reviewed  Nails  Reviewed     Diet Order:   Diet Order           Diet regular Room service appropriate? Yes; Fluid consistency: Thin  Diet effective now          EDUCATION NEEDS:   Education needs have been addressed  Skin:  Skin Assessment: Skin Integrity Issues: Skin Integrity Issues:: Incisions Incisions: closed incision to left chest  Last BM:  PTA (08/26/2017 per chart)  Height:   Ht Readings from Last 1 Encounters:  08/27/17 '5\' 5"'  (1.651 m)    Weight:   Wt Readings from Last 1 Encounters:  08/27/17 99 lb 13.9 oz (45.3 kg)    Ideal Body Weight:  56.8 kg  BMI:  Body mass index is 16.62 kg/m.  Estimated Nutritional Needs:   Kcal:  1585-1815 (35-40 kcal/kg for weight gain)  Protein:  70-80 grams (1.5-1.8 grams/kg)  Fluid:  1.5-1.8 L/day (1 mL/kcal)  Willey Blade, MS, RD, LDN Office: [phone] Pager: [phone] After Hours/Weekend Pager: [phone]

## 2017-08-28 NOTE — Progress Notes (Signed)
Report given to Seth Bake, RN and wife present for transfer with all of personal belongings.

## 2017-08-28 NOTE — Evaluation (Signed)
Physical Therapy Evaluation Patient Details Name: Katie Franklin MRN: [id number] DOB: 1956-01-24 Today's Date: 08/28/2017   History of Present Illness  62 y/o female here with L lung mass, s/p wedge resection L upper lobe with chest tube.  Clinical Impression  Pt did well with PT and showed good overall confidence with balance/ambulation and though she had some mild fatigue her vitals were stable t/o the 200 ft of ambulation and safety was not an issue.  Pt did have pain t/o the session with most tasks but generally was functional apart from inability to do much with L UE.  If pt continues to stay active with nursing/PT while in the hospital and improves as expected she should be able to go home w/o further PT needs.    Follow Up Recommendations No PT follow up(will maintain pt on PT list in hosptial to insure progress)    Equipment Recommendations       Recommendations for Other Services       Precautions / Restrictions Precautions Precaution Comments: chest tube water seal okayed for ambulation Restrictions Weight Bearing Restrictions: No      Mobility  Bed Mobility               General bed mobility comments: pt in recliner on arrival, not tested  Transfers Overall transfer level: Independent Equipment used: None             General transfer comment: Pt able to rise and maintain balance w/o issue  Ambulation/Gait Ambulation/Gait assistance: Supervision Ambulation Distance (Feet): 200 Feet Assistive device: None       General Gait Details: Pt able to ambulate with consistent and relatively confidence cadence.  She was somewhat guarded from L flank pain but vitals remained stable t/o the effort and ultimately she did well  Stairs            Wheelchair Mobility    Modified Rankin (Stroke Patients Only)       Balance Overall balance assessment: Independent                                           Pertinent Vitals/Pain Pain  Assessment: 0-10 Pain Score: 8  Pain Location: chest tube site    Home Living Family/patient expects to be discharged to:: Private residence Living Arrangements: Spouse/significant other Available Help at Discharge: Family Type of Home: House Home Access: Stairs to enter   Technical brewer of Steps: 1 Home Layout: Able to live on main level with bedroom/bathroom Home Equipment: Walker - 2 wheels;Bedside commode      Prior Function Level of Independence: Independent         Comments: Pt able to be very active, out of the home daily     Hand Dominance        Extremity/Trunk Assessment   Upper Extremity Assessment Upper Extremity Assessment: (L UE deferred, R WFL with pain limited testing)    Lower Extremity Assessment Lower Extremity Assessment: Overall WFL for tasks assessed       Communication   Communication: No difficulties  Cognition Arousal/Alertness: Awake/alert Behavior During Therapy: WFL for tasks assessed/performed Overall Cognitive Status: Within Functional Limits for tasks assessed  General Comments      Exercises     Assessment/Plan    PT Assessment Patient needs continued PT services  PT Problem List Decreased strength;Decreased activity tolerance;Decreased range of motion;Decreased balance;Cardiopulmonary status limiting activity;Pain       PT Treatment Interventions Gait training;Stair training;Functional mobility training;Therapeutic activities;Therapeutic exercise;Balance training;Neuromuscular re-education;Patient/family education    PT Goals (Current goals can be found in the Care Plan section)  Acute Rehab PT Goals Patient Stated Goal: go home PT Goal Formulation: With patient Time For Goal Achievement: 09/11/17 Potential to Achieve Goals: Fair    Frequency Min 2X/week   Barriers to discharge        Co-evaluation               AM-PAC PT "6 Clicks" Daily  Activity  Outcome Measure Difficulty turning over in bed (including adjusting bedclothes, sheets and blankets)?: None Difficulty moving from lying on back to sitting on the side of the bed? : None Difficulty sitting down on and standing up from a chair with arms (e.g., wheelchair, bedside commode, etc,.)?: None Help needed moving to and from a bed to chair (including a wheelchair)?: None Help needed walking in hospital room?: None Help needed climbing 3-5 steps with a railing? : None 6 Click Score: 24    End of Session Equipment Utilized During Treatment: Gait belt Activity Tolerance: Patient tolerated treatment well Patient left: with call bell/phone within reach;in chair;with family/visitor present Nurse Communication: Mobility status PT Visit Diagnosis: Muscle weakness (generalized) (M62.81);Pain Pain - Right/Left: Left Pain - part of body: (rib cage)    Time: 4540-9811 PT Time Calculation (min) (ACUTE ONLY): 26 min   Charges:   PT Evaluation $PT Eval Low Complexity: 1 Low     PT G CodesKreg Shropshire, DPT 08/28/2017, 4:08 PM

## 2017-08-28 NOTE — Progress Notes (Signed)
Patient ID: Katie Franklin, female   DOB: [birth year], 62 y.o.   MRN: [id number]  She had a pretty uneventful night.  Her pain is under good control.  She has no shortness of breath.  Her urine output has been good.  She does not have an air leak from her chest tubes.  He drained a total of 150 cc of serous fluid.  Her dressings are clean dry and intact.  Her lungs show diminished breath sounds on the left.  The right lung is clear.  Her heart is regular.  We will transfer the patient to the floor.  She can be out of bed to a chair and ambulate in halls with waterseal drainage.  We will discontinue her Foley catheter.  We will adjust her pain medications.

## 2017-08-29 ENCOUNTER — Inpatient Hospital Stay: Payer: Medicare Other

## 2017-08-29 LAB — SURGICAL PATHOLOGY

## 2017-08-29 MED ORDER — ACETAMINOPHEN 325 MG PO TABS
ORAL_TABLET | ORAL | Status: AC
  Administered 2017-08-29: 20:00:00
  Filled 2017-08-29: qty 2

## 2017-08-29 MED ORDER — GABAPENTIN 100 MG PO CAPS
100.0000 mg | ORAL_CAPSULE | Freq: Three times a day (TID) | ORAL | Status: DC
  Administered 2017-08-29 – 2017-08-30 (×4): 100 mg via ORAL
  Filled 2017-08-29 (×4): qty 1

## 2017-08-29 MED ORDER — ALBUTEROL SULFATE (2.5 MG/3ML) 0.083% IN NEBU
2.5000 mg | INHALATION_SOLUTION | Freq: Three times a day (TID) | RESPIRATORY_TRACT | Status: DC
  Administered 2017-08-29 – 2017-08-30 (×3): 2.5 mg via RESPIRATORY_TRACT
  Filled 2017-08-29 (×3): qty 3

## 2017-08-29 MED ORDER — ACETAMINOPHEN 650 MG RE SUPP: 650.0000 mg | RECTAL | Status: DC | PRN

## 2017-08-29 MED ORDER — ACETAMINOPHEN 325 MG PO TABS
650.0000 mg | ORAL_TABLET | ORAL | Status: DC | PRN
Start: 2017-08-29 — End: 2017-08-30
  Administered 2017-08-29 – 2017-08-30 (×2): 650 mg via ORAL
  Filled 2017-08-29: qty 2

## 2017-08-29 MED ORDER — POLYETHYLENE GLYCOL 3350 17 G PO PACK
17.0000 g | PACK | Freq: Two times a day (BID) | ORAL | Status: DC
  Administered 2017-08-29 – 2017-08-30 (×2): 17 g via ORAL
  Filled 2017-08-29 (×2): qty 1

## 2017-08-29 MED ORDER — ALBUTEROL SULFATE (2.5 MG/3ML) 0.083% IN NEBU: 2.5000 mg | INHALATION_SOLUTION | Freq: Four times a day (QID) | RESPIRATORY_TRACT | Status: DC | PRN

## 2017-08-29 NOTE — Progress Notes (Signed)
Patient ID: Katie Franklin, female   DOB: 05/14/1955, 62 y.o.   MRN: [id number]  Did well overnight.  Not short of breath and pain is under good control.  She does not want any oral narcotics and is using Tramadol only  No air leak from chest tube Serous drainage from both chest tube and JP Wounds redressed.  All look good Lungs equal with some rhonchi  CXRay from today looks good  Will keep chest tube to water seal Ambulate in halls DC IV fluids Repeat CXRAy in the morning  [employer].

## 2017-08-30 ENCOUNTER — Inpatient Hospital Stay: Payer: Medicare Other

## 2017-08-30 MED ORDER — ALBUTEROL SULFATE (2.5 MG/3ML) 0.083% IN NEBU: 2.5000 mg | INHALATION_SOLUTION | Freq: Two times a day (BID) | RESPIRATORY_TRACT | Status: DC

## 2017-08-30 NOTE — Progress Notes (Signed)
Discharge instructions reviewed with the patient and her wife.  Patient given supplies for dressing changes and instructed on how to do the dressing change.  Patient being sent out via wheelchair

## 2017-08-30 NOTE — Progress Notes (Signed)
PT Cancellation Note  Patient Details Name: Katie Franklin MRN: [id number] DOB: 1955-04-21   Cancelled Treatment:    Reason Eval/Treat Not Completed: Patient at procedure or test/unavailable.  Per visitor in pt's room pt recently taken out of room for imaging.  Will attempt to see pt at a future date as medically appropriate.    Linus Salmons PT, DPT 08/30/17, 2:47 PM

## 2017-08-30 NOTE — Care Management Important Message (Signed)
Copy of signed IM left in patient's room.    

## 2017-08-30 NOTE — Progress Notes (Signed)
Patient ID: Katie Franklin, female   DOB: June 16, 1955, 62 y.o.   MRN: [id number]  She has minimal discomfort and is actually been taking minimal amounts of narcotics and pain medication.  She has no complaints of shortness of breath.  She is walking in the halls.  She does not have an air leak.  I changed over dressings yesterday.  They are clean dry and intact.  Her wounds look good.  There is no erythema or drainage.  Today there is no air leak from her chest tube placed to waterseal.  We will repeat her chest x-ray later today.  She did see Dr. Rogue Bussing yesterday.  We are still waiting the final pathology.

## 2017-08-31 ENCOUNTER — Other Ambulatory Visit: Payer: Self-pay

## 2017-08-31 DIAGNOSIS — R918 Other nonspecific abnormal finding of lung field: Secondary | ICD-10-CM

## 2017-09-03 ENCOUNTER — Ambulatory Visit
Admission: RE | Admit: 2017-09-03 | Discharge: 2017-09-03 | Disposition: A | Discharge status: Home / Self Care | Payer: Medicare Other | Source: Ambulatory Visit | Attending: Cardiothoracic Surgery | Admitting: Cardiothoracic Surgery

## 2017-09-03 ENCOUNTER — Ambulatory Visit (INDEPENDENT_AMBULATORY_CARE_PROVIDER_SITE_OTHER): Payer: Medicare Other | Admitting: Cardiothoracic Surgery

## 2017-09-03 ENCOUNTER — Encounter: Payer: Self-pay | Admitting: Cardiothoracic Surgery

## 2017-09-03 ENCOUNTER — Telehealth: Payer: Self-pay

## 2017-09-03 VITALS — BP 143/80 | HR 76 | Temp 97.6°F | Resp 18 | Ht 65.0 in | Wt 100.6 lb

## 2017-09-03 DIAGNOSIS — R918 Other nonspecific abnormal finding of lung field: Secondary | ICD-10-CM

## 2017-09-03 DIAGNOSIS — Z9689 Presence of other specified functional implants: Secondary | ICD-10-CM | POA: Insufficient documentation

## 2017-09-03 NOTE — Discharge Summary (Signed)
Physician Discharge Summary  Patient ID: GEANETTE BUONOCORE MRN: [id number] DOB/AGE: 64-Oct-1957 64 y.o.  Admit date: 08/27/2017 Discharge date: 09/03/2017   Discharge Diagnoses:  Active Problems:   Mass of upper lobe of left lung   Malnutrition of moderate degree   Procedures: This patient underwent a muscle-sparing left thoracotomy with wedge resection of a left upper lobe mass.  Hospital Course: This patient was admitted to the hospital after undergoing a muscle sparing thoracotomy with wedge resection of a left upper lobe mass.  Intraoperative frozen section confirmed the presence of a adenocarcinoma with negative margins.  The patient was nursed in the hospital for 3 days prior to hospital discharge.  Her only real concern during this timeframe was episodes of constipation for which she has had chronic problems.  Her pain was under excellent control and she did not require any narcotics.  She was discharged home once her chest tube have been removed with a Jackson-Pratt in place.  She was instructed on how to manage that and was told to come back to the clinic on 20 May for routine follow-up.  At the time of discharge she did not have any complaints of shortness of breath.  The final pathology did reveal an adenocarcinoma with negative margins.  Disposition:   Discharge Instructions    Call MD for:   Complete by:  As directed    Call Dr. Genevive Bi office on Monday and review with him the amount of JP drainage.  Drain the JP every 8 hours and record amount.   Diet - low sodium heart healthy   Complete by:  As directed    Increase activity slowly   Complete by:  As directed      Allergies as of 08/30/2017      Reactions   Erythromycin Anaphylaxis, Rash   Septra Ds [sulfamethoxazole-trimethoprim] Anaphylaxis   Neck swelling as well    Sulfa Antibiotics Anaphylaxis   Tetracyclines & Related Hives, Itching   Demeclocycline    Latex Rash   Plastic bandaids/ gloves      Medication List     STOP taking these medications   cholestyramine 4 g packet Commonly known as:  QUESTRAN   nicotine 14 mg/24hr patch Commonly known as:  NICODERM CQ - dosed in mg/24 hours     TAKE these medications   acetaminophen 500 MG tablet Commonly known as:  TYLENOL Take 500-1,000 mg by mouth 2 (two) times daily as needed for moderate pain (FOR HEADACHES/BACK PAIN.).   albuterol 108 (90 Base) MCG/ACT inhaler Commonly known as:  PROVENTIL HFA;VENTOLIN HFA Inhale 2 puffs into the lungs every 6 (six) hours as needed for wheezing or shortness of breath.   ALPRAZolam 0.25 MG tablet Commonly known as:  XANAX Take 0.25 mg by mouth 3 (three) times daily as needed for anxiety.   atenolol 50 MG tablet Commonly known as:  TENORMIN Take 50 mg by mouth daily.   CALTRATE 600+D PO Take 1 tablet by mouth daily. Chewable   dicyclomine 10 MG capsule Commonly known as:  BENTYL Take 10 mg by mouth 4 (four) times daily as needed for spasms (FOR ABDOMINAL SPASM.).   eletriptan 40 MG tablet Commonly known as:  RELPAX Take 40 mg by mouth as needed for migraine or headache. May repeat in 2 hours if headache persists or recurs.   pantoprazole 40 MG tablet Commonly known as:  PROTONIX Take 40 mg by mouth daily.   SOOTHE 0.6-0.6 % Soln Generic drug:  Propylene  Glycol-Glycerin Place 1-2 drops into both eyes 5 (five) times daily as needed (for dry eyes.). SOOTHE XP   spironolactone 50 MG tablet Commonly known as:  ALDACTONE Take 25 mg by mouth daily.   sucralfate 1 GM/10ML suspension Commonly known as:  CARAFATE Take 1 g by mouth 3 (three) times daily - between meals and at bedtime.   vitamin B-12 1000 MCG tablet Commonly known as:  CYANOCOBALAMIN Take 1,000 mcg by mouth daily.   vitamin E 1000 UNIT capsule Take 1,000 Units by mouth daily.   XIIDRA 5 % Soln Generic drug:  Lifitegrast Place 1 drop into both eyes 2 (two) times daily as needed (for excessively dry eyes.).   zolpidem 10 MG  tablet Commonly known as:  AMBIEN Take 10 mg by mouth at bedtime as needed for sleep.      Follow-up Information    Nestor Lewandowsky, MD On 09/03/2017.   Specialties:  Cardiothoracic Surgery, General Surgery Why:  Go at 11:30am. Get a chest x-ray 1 hour before your appointment. Contact information: [place] [address] [phone]           Nestor Lewandowsky, MD

## 2017-09-03 NOTE — Progress Notes (Signed)
She returns today in follow-up.  She is now 1 week out from a left thoracotomy and resection of a adenocarcinoma of the lung.  She really has no complaints.  She has had no fevers.  She did have some chills but she relates that to the air conditioner being on.  Her appetite's been good and she does not have any complaints of pain.  She has kept a record of her JP drainage.  It was less than 20 cc for the last 48 hours.  We removed her drain.  We Steri-Stripped her wound after removing the chest tube stitch and the access port incision.  Her wounds are clean dry and intact.  Her lungs are actually clear bilaterally.  Her heart is regular.  We did get a chest x-ray today which shows postoperative changes only.  I would like to see her back again in 2 weeks time.  She is agreeable to doing so.  She will also follow-up with Dr. Lynett Fish.

## 2017-09-03 NOTE — Patient Instructions (Signed)
I will schedule the appointment with Dr. Rogue Bussing and Dr.Oaks and call you later today with the information.  Please be sure to have your chest xray done prior to seeing Dr.Oaks.

## 2017-09-03 NOTE — Telephone Encounter (Signed)
Patient notified of follow up appointment with Dr.Oaks 09/20/17 @ 9 am. Patient understands she will need xray prior to seeing Dr.Oaks.  Spoke with Collette Dr.Brahmandays office and patient is scheduled 09/11/17 in [place]. Patient notified.

## 2017-09-07 ENCOUNTER — Other Ambulatory Visit: Payer: Self-pay | Admitting: Cardiothoracic Surgery

## 2017-09-07 ENCOUNTER — Telehealth: Payer: Self-pay | Admitting: Licensed Clinical Social Worker

## 2017-09-07 MED ORDER — GABAPENTIN 100 MG PO CAPS: 100.0000 mg | ORAL_CAPSULE | Freq: Three times a day (TID) | ORAL | 0 refills | Status: DC

## 2017-09-07 NOTE — Telephone Encounter (Signed)
Gabapentin 100 mg refill sent to pharmacy. Patient notified.

## 2017-09-07 NOTE — Telephone Encounter (Signed)
Patients calling asking if she could get a refill on her gabapentin. Please call patient and advise.

## 2017-09-07 NOTE — Telephone Encounter (Signed)
CSW contacted patient this afternoon in response to an EMMI call. Patient denied being sad or depressed and states she answered different answers because she does not like automated phone calls. Patient was very pleasant and stated she very much appreciated CSW call. Shela Leff MSW,LCSW [phone]

## 2017-09-11 ENCOUNTER — Inpatient Hospital Stay: Payer: Medicare Other | Attending: Internal Medicine | Admitting: Internal Medicine

## 2017-09-11 VITALS — BP 129/85 | HR 57 | Temp 96.6°F | Resp 16 | Wt 103.0 lb

## 2017-09-11 DIAGNOSIS — C3412 Malignant neoplasm of upper lobe, left bronchus or lung: Secondary | ICD-10-CM | POA: Diagnosis not present

## 2017-09-11 DIAGNOSIS — Z853 Personal history of malignant neoplasm of breast: Secondary | ICD-10-CM | POA: Diagnosis not present

## 2017-09-11 DIAGNOSIS — G8912 Acute post-thoracotomy pain: Secondary | ICD-10-CM | POA: Insufficient documentation

## 2017-09-11 NOTE — Progress Notes (Signed)
[facility] OFFICE PROGRESS NOTE  Patient Care Team: [name], MD as PCP - General (Infectious Diseases)  Cancer Staging No matching staging information was found for the patient.   Oncology History   # 2012- LEFT BREAST [pt1b- STAGE I; G-2];ER/PR-Pos; her 2 Neu-NEG;  RS- 21 [risk of recurrence 13%];s/p Lumpec& SLNBx; NO chemo; s/p RT; On Aromasin  [finish April 2017]   # MAY 2019- ADENO CA LUL STAGE IA [s/p wedge resection; Dr.Oaks; [facility]]  DIAGNOSIS: _0  Breast ca Stage I ER/PR pos; Her 2NEG; LUL adeno stage I  GOALS: curative  CURRENT/MOST RECENT THERAPY- surveillance      Carcinoma of overlapping sites of left breast in female, estrogen receptor positive ([place])    Primary cancer of left upper lobe of lung ([place])      INTERVAL HISTORY:  Katie Franklin 62 y.o.  female pleasant patient above history of breast cancer; and also left upper lobe lung cancer is here for a follow-up post surgery.  Patient is recovering well from surgery; however complaining of pain at the site of surgery.  Denies any worsening shortness of breath or cough.  No fevers or chills.  She is gaining weight.  She is quit smoking.  Review of Systems  Constitutional: Negative for chills, diaphoresis, fever, malaise/fatigue and weight loss.  HENT: Negative for nosebleeds and sore throat.   Eyes: Negative for double vision.  Respiratory: Negative for cough, hemoptysis, sputum production, shortness of breath and wheezing.   Cardiovascular: Positive for chest pain. Negative for palpitations, orthopnea and leg swelling.  Gastrointestinal: Negative for abdominal pain, blood in stool, constipation, diarrhea, heartburn, melena, nausea and vomiting.  Genitourinary: Negative for dysuria, frequency and urgency.  Musculoskeletal: Negative for back pain and joint pain.  Skin: Negative.  Negative for itching and rash.  Neurological: Negative for dizziness, tingling, focal weakness, weakness and  headaches.  Endo/Heme/Allergies: Does not bruise/bleed easily.  Psychiatric/Behavioral: Negative for depression. The patient is not nervous/anxious and does not have insomnia.       PAST MEDICAL HISTORY :  Past Medical History:  Diagnosis Date  . Breast cancer ([place]) 2012   left breast  . COPD (chronic obstructive pulmonary disease) ([place])   . Deafness in left ear   . Depression   . Diverticulosis   . 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 Family history of adverse reaction to anesthesia    mother got sick  . Gastritis   . GERD (gastroesophageal reflux disease)   . H. pylori infection   . H/O herpes labialis   . Hemorrhoids   . History of chicken pox   . Hyperplastic colon polyp   . Hypertension   . Lung cancer ([place])   . Multinodular goiter (nontoxic)   . Osteoporosis   . Panic disorder   . Personal history of radiation therapy   . Recurrent sinusitis   . Recurrent sinusitis   . Tobacco use   . Tubular adenoma of colon     PAST SURGICAL HISTORY :   Past Surgical History:  Procedure Laterality Date  . BACK SURGERY  2018   lumbar  . BREAST EXCISIONAL BIOPSY Left    negative 1980's twice  . BREAST EXCISIONAL BIOPSY Left    positive 2012  . BUNIONECTOMY Right   . CHOLECYSTECTOMY N/A 06/11/2017   Procedure: LAPAROSCOPIC CHOLECYSTECTOMY;  Surgeon: Herbert Pun, MD;  Location: [facility] ORS;  Service: General;  Laterality: N/A;  . COLONOSCOPY WITH PROPOFOL N/A  12/28/2014   Procedure: COLONOSCOPY WITH PROPOFOL;  Surgeon: Lollie Sails, MD;  Location: [facility] ENDOSCOPY;  Service: Endoscopy;  Laterality: N/A;  . COLONOSCOPY WITH PROPOFOL N/A 08/17/2017   Procedure: COLONOSCOPY WITH PROPOFOL;  Surgeon: Lollie Sails, MD;  Location: [facility] ENDOSCOPY;  Service: Endoscopy;  Laterality: N/A;  . cyst throat     base of tongue  . ESOPHAGOGASTRODUODENOSCOPY    . ESOPHAGOGASTRODUODENOSCOPY (EGD) WITH PROPOFOL N/A 12/02/2015   Procedure: ESOPHAGOGASTRODUODENOSCOPY (EGD)  WITH PROPOFOL;  Surgeon: Lollie Sails, MD;  Location: [facility] ENDOSCOPY;  Service: Endoscopy;  Laterality: N/A;  . ESOPHAGOGASTRODUODENOSCOPY (EGD) WITH PROPOFOL N/A 04/13/2017   Procedure: ESOPHAGOGASTRODUODENOSCOPY (EGD) WITH PROPOFOL;  Surgeon: Lollie Sails, MD;  Location: [facility] ENDOSCOPY;  Service: Endoscopy;  Laterality: N/A;  . FLEXIBLE BRONCHOSCOPY N/A 08/27/2017   Procedure: PREOP BRONCHOSCOPY;  Surgeon: Nestor Lewandowsky, MD;  Location: [facility] ORS;  Service: Thoracic;  Laterality: N/A;  . GANGLION CYST EXCISION Right    wrist  . Incision tendon sheath for trigger finger Left   . LUMBAR LAMINECTOMY/DECOMPRESSION MICRODISCECTOMY Right 11/06/2016   Procedure: RIGHT L5/S1 HEMILAMINECTOMY AND CYST RESECTION L5/S1;  Surgeon: Deetta Perla, MD;  Location: [facility] ORS;  Service: Neurosurgery;  Laterality: Right;  . MASTECTOMY, PARTIAL Left    left node dissection  . NASAL SEPTUM SURGERY    . THORACOTOMY Left 08/27/2017   Procedure: THORACOTOMY MAJOR;  Surgeon: Nestor Lewandowsky, MD;  Location: [facility] ORS;  Service: Thoracic;  Laterality: Left;  . TONSILLECTOMY    . TRIGGER FINGER RELEASE Left     FAMILY HISTORY :   Family History  Problem Relation Age of Onset  . Breast cancer Maternal Aunt 90       great aunt  . Prostate cancer Father   . Hypertension Father   . Heart disease Father   . Alzheimer's disease Mother   . Heart disease Mother   . Hypertension Mother   . Colon polyps Mother   . Depression Brother        brother #2    SOCIAL HISTORY:   Social History   Tobacco Use  . Smoking status: Former Smoker    Packs/day: 1.00    Years: 45.00    Pack years: 45.00    Types: Cigarettes    Last attempt to quit: 07/26/2017    Years since quitting: 0.1  . Smokeless tobacco: Never Used  Substance Use Topics  . Alcohol use: No    Alcohol/week: 0.0 oz  . Drug use: No    ALLERGIES:  is allergic to erythromycin; septra ds [sulfamethoxazole-trimethoprim]; sulfa antibiotics; tetracyclines  & related; demeclocycline; and latex.  MEDICATIONS:  Current Outpatient Medications  Medication Sig Dispense Refill  . acetaminophen (TYLENOL) 500 MG tablet Take 500-1,000 mg by mouth 2 (two) times daily as needed for moderate pain (FOR HEADACHES/BACK PAIN.).     Marland Kitchen albuterol (PROVENTIL HFA;VENTOLIN HFA) 108 (90 BASE) MCG/ACT inhaler Inhale 2 puffs into the lungs every 6 (six) hours as needed for wheezing or shortness of breath.     . ALPRAZolam (XANAX) 0.25 MG tablet Take 0.25 mg by mouth 3 (three) times daily as needed for anxiety.     Marland Kitchen atenolol (TENORMIN) 50 MG tablet Take 50 mg by mouth daily.    . Calcium Carbonate-Vitamin D (CALTRATE 600+D PO) Take 1 tablet by mouth daily. Chewable    . dicyclomine (BENTYL) 10 MG capsule Take 10 mg by mouth 4 (four) times daily as needed for spasms (FOR ABDOMINAL SPASM.).     Marland Kitchen  eletriptan (RELPAX) 40 MG tablet Take 40 mg by mouth as needed for migraine or headache. May repeat in 2 hours if headache persists or recurs.    . gabapentin (NEURONTIN) 100 MG capsule Take 1 capsule (100 mg total) by mouth 3 (three) times daily. 90 capsule 0  . Lifitegrast (XIIDRA) 5 % SOLN Place 1 drop into both eyes 2 (two) times daily as needed (for excessively dry eyes.).    Marland Kitchen pantoprazole (PROTONIX) 40 MG tablet Take 40 mg by mouth daily.    Marland Kitchen spironolactone (ALDACTONE) 50 MG tablet Take 25 mg by mouth daily.     . sucralfate (CARAFATE) 1 GM/10ML suspension Take 1 g by mouth 3 (three) times daily - between meals and at bedtime.     . traMADol (ULTRAM) 50 MG tablet Take by mouth every 6 (six) hours as needed.    . vitamin B-12 (CYANOCOBALAMIN) 1000 MCG tablet Take 1,000 mcg by mouth daily.    . vitamin E 1000 UNIT capsule Take 1,000 Units by mouth daily.    Marland Kitchen zolpidem (AMBIEN) 10 MG tablet Take 10 mg by mouth at bedtime as needed for sleep.    Marland Kitchen Propylene Glycol-Glycerin (SOOTHE) 0.6-0.6 % SOLN Place 1-2 drops into both eyes 5 (five) times daily as needed (for dry eyes.).  SOOTHE XP     No current facility-administered medications for this visit.     PHYSICAL EXAMINATION: ECOG PERFORMANCE STATUS: 1 - Symptomatic but completely ambulatory  BP 129/85 (BP Location: Left Arm)   Pulse (!) 57   Temp (!) 96.6 F (35.9 C)   Resp 16   Wt 103 lb (46.7 kg)   BMI 17.14 kg/m   Filed Weights   09/11/17 1433  Weight: 103 lb (46.7 kg)    GENERAL: Well-nourished well-developed; Alert, no distress and comfortable.  Accompanied by family. EYES: no pallor or icterus OROPHARYNX: no thrush or ulceration; NECK: supple; no lymph nodes felt. LYMPH:  no palpable lymphadenopathy in the axillary or inguinal regions LUNGS: Decreased breath sounds auscultation bilaterally. No wheeze or crackles HEART/CVS: regular rate & rhythm and no murmurs; No lower extremity edema ABDOMEN:abdomen soft, non-tender and normal bowel sounds. No hepatomegaly or splenomegaly.  Musculoskeletal:no cyanosis of digits and no clubbing  PSYCH: alert & oriented x 3 with fluent speech NEURO: no focal motor/sensory deficits SKIN:  no rashes or significant lesions; left chest wall surgical incision healing well.  Steri-Strips in place.    LABORATORY DATA:  I have reviewed the data as listed    Component Value Date/Time   NA 139 08/20/2017 1504   NA 136 02/06/2012 0957   K 3.5 08/20/2017 1504   K 3.8 02/06/2012 0957   CL 104 08/20/2017 1504   CO2 26 08/20/2017 1504   GLUCOSE 98 08/20/2017 1504   BUN 9 08/20/2017 1504   CREATININE 0.65 08/20/2017 1504   CREATININE 0.97 11/12/2013 1508   CALCIUM 9.5 08/20/2017 1504   CALCIUM 9.5 05/08/2012 1018   PROT 8.0 08/20/2017 1504   PROT 7.6 11/12/2013 1508   ALBUMIN 4.5 08/20/2017 1504   ALBUMIN 3.7 11/12/2013 1508   AST 15 08/20/2017 1504   AST 10 (L) 11/12/2013 1508   ALT 12 (L) 08/20/2017 1504   ALT 15 11/12/2013 1508   ALKPHOS 39 08/20/2017 1504   ALKPHOS 49 11/12/2013 1508   BILITOT 1.5 (H) 08/20/2017 1504   BILITOT 0.7 11/12/2013 1508    GFRNONAA >60 08/20/2017 1504   GFRNONAA >60 11/12/2013 1508   GFRAA >  60 08/20/2017 1504   GFRAA >60 11/12/2013 1508    No results found for: SPEP, UPEP  Lab Results  Component Value Date   WBC 6.3 08/20/2017   NEUTROABS 3.4 08/20/2017   HGB 14.8 08/20/2017   HCT 41.7 08/20/2017   MCV 94.0 08/20/2017   PLT 215 08/20/2017      Chemistry      Component Value Date/Time   NA 139 08/20/2017 1504   NA 136 02/06/2012 0957   K 3.5 08/20/2017 1504   K 3.8 02/06/2012 0957   CL 104 08/20/2017 1504   CO2 26 08/20/2017 1504   BUN 9 08/20/2017 1504   CREATININE 0.65 08/20/2017 1504   CREATININE 0.97 11/12/2013 1508      Component Value Date/Time   CALCIUM 9.5 08/20/2017 1504   CALCIUM 9.5 05/08/2012 1018   ALKPHOS 39 08/20/2017 1504   ALKPHOS 49 11/12/2013 1508   AST 15 08/20/2017 1504   AST 10 (L) 11/12/2013 1508   ALT 12 (L) 08/20/2017 1504   ALT 15 11/12/2013 1508   BILITOT 1.5 (H) 08/20/2017 1504   BILITOT 0.7 11/12/2013 1508       RADIOGRAPHIC STUDIES: I have personally reviewed the radiological images as listed and agreed with the findings in the report. No results found.   ASSESSMENT & PLAN:  Primary cancer of left upper lobe of lung ([place]) #Left upper lobe adenocarcinoma stage I; status post wedge resection; clear margins.  No role for any further adjuvant therapy.  However would recommend surveillance CT scans every 6 months also.  #Post thoracotomy pain-currently on tramadol; Neurontin.  Stable  #History of breast cancer stage I-currently status post Arimidex; no evidence of recurrence.  Stable.  #Recommend follow-up in 6 months/no labs CT scan prior.   Orders Placed This Encounter  Procedures  . CT CHEST W CONTRAST    Standing Status:   Future    Standing Expiration Date:   09/12/2018    Order Specific Question:   If indicated for the ordered procedure, I authorize the administration of contrast media per Radiology protocol    Answer:   Yes    Order  Specific Question:   Preferred imaging location?    Answer:   [facility]    Order Specific Question:   Radiology Contrast Protocol - do NOT remove file path    Answer:   [email or web address]   All questions were answered. The patient knows to call the clinic with any problems, questions or concerns.      Cammie Sickle, MD 09/11/2017 3:52 PM

## 2017-09-11 NOTE — Assessment & Plan Note (Addendum)
#  Left upper lobe adenocarcinoma stage I; status post wedge resection; clear margins.  No role for any further adjuvant therapy.  However would recommend surveillance CT scans every 6 months also.  #Post thoracotomy pain-currently on tramadol; Neurontin.  Stable  #History of breast cancer stage I-currently status post Arimidex; no evidence of recurrence.  Stable.  #Recommend follow-up in 6 months/no labs CT scan prior.

## 2017-09-20 ENCOUNTER — Ambulatory Visit
Admission: RE | Admit: 2017-09-20 | Discharge: 2017-09-20 | Disposition: A | Discharge status: Home / Self Care | Payer: Medicare Other | Source: Ambulatory Visit | Attending: Cardiothoracic Surgery | Admitting: Cardiothoracic Surgery

## 2017-09-20 ENCOUNTER — Ambulatory Visit (INDEPENDENT_AMBULATORY_CARE_PROVIDER_SITE_OTHER): Payer: Medicare Other | Admitting: Cardiothoracic Surgery

## 2017-09-20 ENCOUNTER — Encounter: Payer: Self-pay | Admitting: Cardiothoracic Surgery

## 2017-09-20 VITALS — BP 151/87 | HR 62 | Temp 97.4°F | Resp 18 | Ht 65.0 in | Wt 103.2 lb

## 2017-09-20 DIAGNOSIS — R918 Other nonspecific abnormal finding of lung field: Secondary | ICD-10-CM | POA: Diagnosis not present

## 2017-09-20 DIAGNOSIS — Z9889 Other specified postprocedural states: Secondary | ICD-10-CM | POA: Insufficient documentation

## 2017-09-20 DIAGNOSIS — J449 Chronic obstructive pulmonary disease, unspecified: Secondary | ICD-10-CM | POA: Insufficient documentation

## 2017-09-20 MED ORDER — TRAMADOL HCL 50 MG PO TABS: 50.0000 mg | ORAL_TABLET | Freq: Four times a day (QID) | ORAL | 0 refills | Status: DC | PRN

## 2017-09-20 NOTE — Patient Instructions (Signed)
Please see your follow up appointment listed below.   I have scheduled an appointment with Dr.Sneer 09/25/17 @ 11:30.

## 2017-09-20 NOTE — Progress Notes (Signed)
  Patient ID: Katie Franklin, female   DOB: 1955-07-18, 62 y.o.   MRN: [id number]  HISTORY: She returns today in follow-up.  She continues to have some postoperative musculoskeletal pain and only uses the tramadol during the daytime.  At night she finds Tylenol works best for her.  She did notice over the weekend she has some swelling in her right groin.  This was not associated with any particular activity.  Eventually the swelling went down.  She does see Dr. Ella Jubilee and vascular surgery and would like another referral to him.  We will go ahead and set that up.  She has some discomfort in her right groin as well.   Vitals:   09/20/17 0852  BP: (!) 151/87  Pulse: 62  Resp: 18  Temp: (!) 97.4 F (36.3 C)  SpO2: 100%     EXAM:    Resp: Lungs are clear bilaterally.  No respiratory distress, normal effort. Heart:  Regular without murmurs Abd:  Abdomen is soft, non distended and non tender. No masses are palpable.  There is no rebound and no guarding.  Neurological: Alert and oriented to person, place, and time. Coordination normal.  Skin: Skin is warm and dry. No rash noted. No diaphoretic. No erythema. No pallor.  Psychiatric: Normal mood and affect. Normal behavior. Judgment and thought content normal.   Examination of the right groin does not reveal any obvious abnormality.  There is a small 5 to 6 mm mobile lymph node present in the area that she described is some swelling.  There is no erythema drainage warmth or mass   ASSESSMENT: She did have a chest x-ray today which have independently reviewed.  I see no complicating features.   PLAN:   I will see her back again in 2 months time.  She did meet with Dr. Lynett Fish who did not recommend any postoperative therapy.    Nestor Lewandowsky, MD

## 2017-09-25 ENCOUNTER — Encounter

## 2017-09-25 ENCOUNTER — Encounter (INDEPENDENT_AMBULATORY_CARE_PROVIDER_SITE_OTHER): Payer: Self-pay | Admitting: Vascular Surgery

## 2017-09-25 ENCOUNTER — Ambulatory Visit (INDEPENDENT_AMBULATORY_CARE_PROVIDER_SITE_OTHER): Payer: Medicare Other | Admitting: Vascular Surgery

## 2017-09-25 VITALS — BP 116/77 | HR 60 | Resp 12 | Ht 65.0 in | Wt 103.0 lb

## 2017-09-25 DIAGNOSIS — I809 Phlebitis and thrombophlebitis of unspecified site: Secondary | ICD-10-CM

## 2017-09-25 NOTE — Progress Notes (Signed)
Subjective:    Patient ID: Katie Franklin, female    DOB: 1955/08/03, 62 y.o.   MRN: [id number] Chief Complaint  Patient presents with  . New Patient (Initial Visit)    Swollen vein in thigh   Presents at the request of [facility] for evaluation of a right thigh vein.  Patient endorses a history of experiencing an episode of a red hard and tender vein in the right groin just under the skin.  The symptoms lasted a few days then resolved.  The symptoms have not reoccurred.  This was the first episode the patient has experienced.  The patient has a past medical history of lumbar spine issues and does have chronic right lower extremity pain however this has not worsened or been associated with any new edema to the extremity.  The patient denies any shortness of breath or chest pain.  The patient denies any history of DVT history.  The patient denies any fever, nausea vomiting.  Review of Systems  Constitutional: Negative.   HENT: Negative.   Eyes: Negative.   Respiratory: Negative.   Cardiovascular:       Swollen vein in right high  Gastrointestinal: Negative.   Endocrine: Negative.   Genitourinary: Negative.   Musculoskeletal: Negative.   Skin: Negative.   Allergic/Immunologic: Negative.   Neurological: Negative.   Hematological: Negative.   Psychiatric/Behavioral: Negative.       Objective:   Physical Exam  Constitutional: She is oriented to person, place, and time. She appears well-developed and well-nourished. No distress.  HENT:  Head: Normocephalic and atraumatic.  Right Ear: External ear normal.  Left Ear: External ear normal.  Eyes: Pupils are equal, round, and reactive to light. Conjunctivae and EOM are normal.  Neck: Normal range of motion.  Cardiovascular: Normal rate, regular rhythm, normal heart sounds and intact distal pulses.  Pulses:      Radial pulses are 2+ on the right side, and 2+ on the left side.       Dorsalis pedis pulses are 2+ on the  right side, and 2+ on the left side.       Posterior tibial pulses are 2+ on the right side, and 2+ on the left side.  Pulmonary/Chest: Effort normal and breath sounds normal.  Musculoskeletal: She exhibits no edema.  Neurological: She is alert and oriented to person, place, and time.  Skin: Skin is warm and dry. She is not diaphoretic.  There is no signs of thrombophlebitis to the right lower extremity.  Skin is intact.  There is no cellulitis.  Psychiatric: She has a normal mood and affect. Her behavior is normal. Judgment and thought content normal.   BP 116/77 (BP Location: Right Arm, Patient Position: Sitting)   Pulse 60   Resp 12   Ht _0  (1.651 m)   Wt 103 lb (46.7 kg)   BMI 17.14 kg/m   Past Medical History:  Diagnosis Date  . Breast cancer ([place]) 2012   left breast  . COPD (chronic obstructive pulmonary disease) ([place])   . Deafness in left ear   . Depression   . Diverticulosis   . 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 Family history of adverse reaction to anesthesia    mother got sick  . Gastritis   . GERD (gastroesophageal reflux disease)   . H. pylori infection   . H/O herpes labialis   . Hemorrhoids   . History of chicken pox   . Hyperplastic  colon polyp   . Hypertension   . Lung cancer ([place])   . Multinodular goiter (nontoxic)   . Osteoporosis   . Panic disorder   . Personal history of radiation therapy   . Recurrent sinusitis   . Recurrent sinusitis   . Tobacco use   . Tubular adenoma of colon    Social History   Socioeconomic History  . Marital status: Married    Spouse name: Not on file  . Number of children: Not on file  . Years of education: Not on file  . Highest education level: Not on file  Occupational History  . Not on file  Social Needs  . Financial resource strain: Not on file  . Food insecurity:    Worry: Not on file    Inability: Not on file  . Transportation needs:    Medical: Not on file    Non-medical: Not on file    Tobacco Use  . Smoking status: Former Smoker    Packs/day: 1.00    Years: 45.00    Pack years: 45.00    Types: Cigarettes    Last attempt to quit: 07/26/2017    Years since quitting: 0.1  . Smokeless tobacco: Never Used  Substance and Sexual Activity  . Alcohol use: No    Alcohol/week: 0.0 oz  . Drug use: No  . Sexual activity: Not on file  Lifestyle  . Physical activity:    Days per week: Not on file    Minutes per session: Not on file  . Stress: Not on file  Relationships  . Social connections:    Talks on phone: Not on file    Gets together: Not on file    Attends religious service: Not on file    Active member of club or organization: Not on file    Attends meetings of clubs or organizations: Not on file    Relationship status: Not on file  . Intimate partner violence:    Fear of current or ex partner: Not on file    Emotionally abused: Not on file    Physically abused: Not on file    Forced sexual activity: Not on file  Other Topics Concern  . Not on file  Social History Narrative  . Not on file   Past Surgical History:  Procedure Laterality Date  . BACK SURGERY  2018   lumbar  . BREAST EXCISIONAL BIOPSY Left    negative 1980's twice  . BREAST EXCISIONAL BIOPSY Left    positive 2012  . BUNIONECTOMY Right   . CHOLECYSTECTOMY N/A 06/11/2017   Procedure: LAPAROSCOPIC CHOLECYSTECTOMY;  Surgeon: Herbert Pun, MD;  Location: [facility] ORS;  Service: General;  Laterality: N/A;  . COLONOSCOPY WITH PROPOFOL N/A 12/28/2014   Procedure: COLONOSCOPY WITH PROPOFOL;  Surgeon: Lollie Sails, MD;  Location[facility]COPY;  Service: Endoscopy;  Laterality: N/A;  . COLONOSCOPY WITH PROPOFOL N/A 08/17/2017   Procedure: COLONOSCOPY WITH PROPOFOL;  Surgeon: Lollie Sails, MD;  Location: [facility] ENDOSCOPY;  Service: Endoscopy;  Laterality: N/A;  . cyst throat     base of tongue  . ESOPHAGOGASTRODUODENOSCOPY    . ESOPHAGOGASTRODUODENOSCOPY (EGD) WITH PROPOFOL N/A  12/02/2015   Procedure: ESOPHAGOGASTRODUODENOSCOPY (EGD) WITH PROPOFOL;  Surgeon: Lollie Sails, MD;  Location: [facility] ENDOSCOPY;  Service: Endoscopy;  Laterality: N/A;  . ESOPHAGOGASTRODUODENOSCOPY (EGD) WITH PROPOFOL N/A 04/13/2017   Procedure: ESOPHAGOGASTRODUODENOSCOPY (EGD) WITH PROPOFOL;  Surgeon: Lollie Sails, MD;  Location: [facility] ENDOSCOPY;  Service: Endoscopy;  Laterality:  N/A;  . FLEXIBLE BRONCHOSCOPY N/A 08/27/2017   Procedure: PREOP BRONCHOSCOPY;  Surgeon: Nestor Lewandowsky, MD;  Location: [facility] ORS;  Service: Thoracic;  Laterality: N/A;  . GANGLION CYST EXCISION Right    wrist  . Incision tendon sheath for trigger finger Left   . LUMBAR LAMINECTOMY/DECOMPRESSION MICRODISCECTOMY Right 11/06/2016   Procedure: RIGHT L5/S1 HEMILAMINECTOMY AND CYST RESECTION L5/S1;  Surgeon: Deetta Perla, MD;  Location: [facility] ORS;  Service: Neurosurgery;  Laterality: Right;  . MASTECTOMY, PARTIAL Left    left node dissection  . NASAL SEPTUM SURGERY    . THORACOTOMY Left 08/27/2017   Procedure: THORACOTOMY MAJOR;  Surgeon: Nestor Lewandowsky, MD;  Location: [facility] ORS;  Service: Thoracic;  Laterality: Left;  . TONSILLECTOMY    . TRIGGER FINGER RELEASE Left    Family History  Problem Relation Age of Onset  . Breast cancer Maternal Aunt 90       great aunt  . Prostate cancer Father   . Hypertension Father   . Heart disease Father   . Alzheimer's disease Mother   . Heart disease Mother   . Hypertension Mother   . Colon polyps Mother   . Depression Brother        brother #2   Allergies  Allergen Reactions  . Erythromycin Anaphylaxis and Rash  . Septra Ds [Sulfamethoxazole-Trimethoprim] Anaphylaxis    Neck swelling as well   . Sulfa Antibiotics Anaphylaxis  . Tetracyclines & Related Hives and Itching  . Demeclocycline   . Latex Rash    Plastic bandaids/ gloves      Assessment & Plan:  Presents at the request of [facility] for evaluation of a right thigh vein.  Patient  endorses a history of experiencing an episode of a red hard and tender vein in the right groin just under the skin.  The symptoms lasted a few days then resolved.  The symptoms have not reoccurred.  This was the first episode the patient has experienced.  The patient has a past medical history of lumbar spine issues and does have chronic right lower extremity pain however this has not worsened or been associated with any new edema to the extremity.  The patient denies any shortness of breath or chest pain.  The patient denies any history of DVT history.  The patient denies any fever, nausea vomiting.  1. Thrombophlebitis - New Sounds like the patient experienced a one-time episode of thrombophlebitis to a superficial vein in the right groin. Sounds like the symptoms lasted for a few days and have resolved.  The symptoms have not returned. At this time no treatment is indicated We discussed moving forward with an official venous work-up however at this time the patient would like to wait to see if the symptoms recur.  If her symptoms recur we will be happy to bring her back and have her undergo a bilateral lower extremity venous duplex to rule out any contributing versus lymphatic disease. The patient was encouraged to wear graduated compression stockings (20-30 mmHg) on a daily basis. The patient was instructed to begin wearing the stockings first thing in the morning and removing them in the evening. The patient was instructed specifically not to sleep in the stockings. Prescription given.  In addition, behavioral modification including elevation during the day will be initiated. Anti-inflammatories for pain. The patient will follow up PRN The patient was instructed to call the office in the interim if any worsening edema or ulcerations to the legs, feet or toes occurs.  The patient expresses their understanding.  Current Outpatient Medications on File Prior to Visit  Medication Sig Dispense Refill   . acetaminophen (TYLENOL) 500 MG tablet Take 500-1,000 mg by mouth 2 (two) times daily as needed for moderate pain (FOR HEADACHES/BACK PAIN.).     Marland Kitchen albuterol (PROVENTIL HFA;VENTOLIN HFA) 108 (90 BASE) MCG/ACT inhaler Inhale 2 puffs into the lungs every 6 (six) hours as needed for wheezing or shortness of breath.     . ALPRAZolam (XANAX) 0.25 MG tablet Take 0.25 mg by mouth 3 (three) times daily as needed for anxiety.     Marland Kitchen atenolol (TENORMIN) 50 MG tablet Take 50 mg by mouth daily.    . Calcium Carbonate-Vitamin D (CALTRATE 600+D PO) Take 1 tablet by mouth daily. Chewable    . dicyclomine (BENTYL) 10 MG capsule Take 10 mg by mouth 4 (four) times daily as needed for spasms (FOR ABDOMINAL SPASM.).     Marland Kitchen eletriptan (RELPAX) 40 MG tablet Take 40 mg by mouth as needed for migraine or headache. May repeat in 2 hours if headache persists or recurs.    . gabapentin (NEURONTIN) 100 MG capsule Take 1 capsule (100 mg total) by mouth 3 (three) times daily. 90 capsule 0  . Lifitegrast (XIIDRA) 5 % SOLN Place 1 drop into both eyes 2 (two) times daily as needed (for excessively dry eyes.).    Marland Kitchen pantoprazole (PROTONIX) 40 MG tablet Take 40 mg by mouth daily.    Marland Kitchen Propylene Glycol-Glycerin (SOOTHE) 0.6-0.6 % SOLN Place 1-2 drops into both eyes 5 (five) times daily as needed (for dry eyes.). SOOTHE XP    . spironolactone (ALDACTONE) 50 MG tablet Take 25 mg by mouth daily.     . sucralfate (CARAFATE) 1 GM/10ML suspension Take 1 g by mouth 3 (three) times daily - between meals and at bedtime.     . traMADol (ULTRAM) 50 MG tablet Take 1 tablet (50 mg total) by mouth every 6 (six) hours as needed. 20 tablet 0  . vitamin B-12 (CYANOCOBALAMIN) 1000 MCG tablet Take 1,000 mcg by mouth daily.    . vitamin E 1000 UNIT capsule Take 1,000 Units by mouth daily.    Marland Kitchen zolpidem (AMBIEN) 10 MG tablet Take 10 mg by mouth at bedtime as needed for sleep.     No current facility-administered medications on file prior to visit.     There are no Patient Instructions on file for this visit. No follow-ups on file.  Sherrod Toothman A Derreon Consalvo, PA-C

## 2017-10-08 ENCOUNTER — Telehealth: Payer: Self-pay | Admitting: Cardiothoracic Surgery

## 2017-10-08 ENCOUNTER — Other Ambulatory Visit: Payer: Self-pay

## 2017-10-08 DIAGNOSIS — R918 Other nonspecific abnormal finding of lung field: Secondary | ICD-10-CM

## 2017-10-08 NOTE — Telephone Encounter (Signed)
Patients calling and is complaining of having some pain on the left side around the ribs and lower at night the pain gets worse said her Pain level is about a six. Patient also hurting in the shoulder. Please call patient and advise.

## 2017-10-08 NOTE — Telephone Encounter (Signed)
I have called patient. Patient stated that she has had pain at the left lower rib times 2 weeks. She states that the tramadol that she only takes during the day, helps to a minimum. She takes tylenol during the evening and this does not help with pain at all. She states that the pain hurts "severly". She requested to see Dr Genevive Bi to be evaluated. I have scheduled her to see Dr Genevive Bi on 10/10/17 with a chest x ray. Patient has been advised to have her chest x ray prior to coming to appt. Patient understands.

## 2017-10-10 ENCOUNTER — Ambulatory Visit
Admission: RE | Admit: 2017-10-10 | Discharge: 2017-10-10 | Disposition: A | Discharge status: Home / Self Care | Payer: Medicare Other | Source: Ambulatory Visit | Attending: Cardiothoracic Surgery | Admitting: Cardiothoracic Surgery

## 2017-10-10 ENCOUNTER — Ambulatory Visit (INDEPENDENT_AMBULATORY_CARE_PROVIDER_SITE_OTHER): Payer: Medicare Other | Admitting: Cardiothoracic Surgery

## 2017-10-10 ENCOUNTER — Encounter: Payer: Self-pay | Admitting: Cardiothoracic Surgery

## 2017-10-10 VITALS — BP 131/86 | HR 63 | Temp 98.3°F | Wt 105.0 lb

## 2017-10-10 DIAGNOSIS — R918 Other nonspecific abnormal finding of lung field: Secondary | ICD-10-CM | POA: Insufficient documentation

## 2017-10-10 MED ORDER — GABAPENTIN 100 MG PO CAPS: 200.0000 mg | ORAL_CAPSULE | Freq: Three times a day (TID) | ORAL | 0 refills | Status: DC

## 2017-10-10 MED ORDER — TRAMADOL HCL 50 MG PO TABS: 50.0000 mg | ORAL_TABLET | Freq: Four times a day (QID) | ORAL | 0 refills | Status: DC | PRN

## 2017-10-10 NOTE — Progress Notes (Signed)
She returns today in follow-up.  She is about 6 weeks out from her left thoracotomy and wedge resection of a small peripheral lung cancer.  Her chief complaint today is that she has some pain in her left upper quadrant.  She describes this pain is just slightly superior and slightly lateral to her umbilicus.  She says occasionally it radiates around the back.  She also complains of pain in her shoulder on occasion and also pain around her breast.  All 3 of these pain syndromes appear to be separate and are not correlated with each other.  She states that this occurs mostly whenever she bends over.  There is nothing that alleviates the pain.  She takes Tylenol in the morning and at night and will occasionally take 1 tramadol tablet.  She remains on the Neurontin 100 mg 3 times daily.  She did have a chest x-ray made today which have independently reviewed.  That shows some postoperative changes on the left but no pneumothorax or pleural effusion.  Her thoracotomy wound is well-healed.  All of her other chest tube sites look fine.  Her abdominal exam is unremarkable.  Her abdomen is soft and nontender.  There is active bowel sounds throughout.  Her lungs are clear bilaterally and her heart is regular.  I we will refill her prescription for tramadol.  I will also increase her Neurontin from 100 mg 3 times a day to 200 mg 3 times a day.  We will also make a referral to our pain management clinic.  All of her questions were answered.  She seemed to be satisfied with this course of action.  She does have a follow-up appointment with Dr. Lynett Fish in the oncology department in November.

## 2017-10-10 NOTE — Patient Instructions (Signed)
We will refer you to the Pain Clinic to see if they could help you with the pain.  Please give [place] a call in case you have any questions or concerns.

## 2017-10-29 ENCOUNTER — Other Ambulatory Visit
Admission: RE | Admit: 2017-10-29 | Discharge: 2017-10-29 | Disposition: A | Discharge status: Home / Self Care | Payer: Medicare Other | Source: Ambulatory Visit | Attending: Cardiovascular Disease | Admitting: Cardiovascular Disease

## 2017-10-29 DIAGNOSIS — I700 Atherosclerosis of aorta: Secondary | ICD-10-CM | POA: Diagnosis present

## 2017-10-29 DIAGNOSIS — I2510 Atherosclerotic heart disease of native coronary artery without angina pectoris: Secondary | ICD-10-CM | POA: Diagnosis present

## 2017-10-29 LAB — LIPID PANEL
Cholesterol: 201 mg/dL — ABNORMAL HIGH (ref 0–200)
HDL: 54 mg/dL (ref >40)
LDL CALC: 122 mg/dL — AB (ref 0–99)
Total CHOL/HDL Ratio: 3.7 RATIO
Triglycerides: 123 mg/dL (ref <150)
VLDL: 25 mg/dL (ref 0–40)

## 2017-10-31 ENCOUNTER — Other Ambulatory Visit: Payer: Self-pay | Admitting: *Deleted

## 2017-10-31 MED ORDER — ROSUVASTATIN CALCIUM 10 MG PO TABS: 10.0000 mg | ORAL_TABLET | Freq: Every day | ORAL | 3 refills | Status: DC

## 2017-11-01 ENCOUNTER — Other Ambulatory Visit: Payer: Self-pay | Admitting: *Deleted

## 2017-11-01 DIAGNOSIS — C3412 Malignant neoplasm of upper lobe, left bronchus or lung: Secondary | ICD-10-CM

## 2017-11-21 ENCOUNTER — Other Ambulatory Visit: Payer: Self-pay

## 2017-11-21 DIAGNOSIS — R918 Other nonspecific abnormal finding of lung field: Secondary | ICD-10-CM

## 2017-11-27 ENCOUNTER — Encounter: Payer: Self-pay | Admitting: Cardiothoracic Surgery

## 2017-11-27 ENCOUNTER — Ambulatory Visit
Admission: RE | Admit: 2017-11-27 | Discharge: 2017-11-27 | Disposition: A | Discharge status: Home / Self Care | Payer: Medicare Other | Source: Ambulatory Visit | Attending: Cardiothoracic Surgery | Admitting: Cardiothoracic Surgery

## 2017-11-27 ENCOUNTER — Ambulatory Visit (INDEPENDENT_AMBULATORY_CARE_PROVIDER_SITE_OTHER): Payer: Medicare Other | Admitting: Cardiothoracic Surgery

## 2017-11-27 DIAGNOSIS — R918 Other nonspecific abnormal finding of lung field: Secondary | ICD-10-CM

## 2017-11-27 DIAGNOSIS — J449 Chronic obstructive pulmonary disease, unspecified: Secondary | ICD-10-CM | POA: Diagnosis not present

## 2017-11-27 NOTE — Progress Notes (Signed)
  Patient ID: Katie Franklin, female   DOB: 1955-12-08, 62 y.o.   MRN: [id number]  HISTORY: She returns today in follow-up.  Her main complaint today is that she continues to have some pain.  However she has been extremely active cleaning out her garage and moving heavy boxes.  She is not short of breath.  She did see Dr. Lynett Fish in oncology and he has a CT scan scheduled for later this year.   There were no vitals filed for this visit.   EXAM:    Resp: Lungs are clear bilaterally.  No respiratory distress, normal effort. Heart:  Regular without murmurs Abd:  Abdomen is soft, non distended and non tender. No masses are palpable.  There is no rebound and no guarding.  Neurological: Alert and oriented to person, place, and time. Coordination normal.  Skin: Skin is warm and dry. No rash noted. No diaphoretic. No erythema. No pallor.  Psychiatric: Normal mood and affect. Normal behavior. Judgment and thought content normal.    ASSESSMENT: Status post left thoracotomy and lung resection.   PLAN:   Overall she seems to be doing well.  She did express some concern about depression and she is scheduled to meet with her primary care physician for that.  She also would like to stop taking the tramadol and I told her that would be okay.  She really has taken no narcotics postoperatively.  I did not make a return visit for her but would be happy to see her should the need arise.    Nestor Lewandowsky, MD

## 2017-11-27 NOTE — Patient Instructions (Signed)
Can you please give [place] a call in case you have questions or concerns.  Please tell your primary care doctor about your depression.  Please continue to be seen by Dr. Rogue Bussing.

## 2017-11-30 ENCOUNTER — Encounter: Payer: Self-pay | Admitting: Cardiothoracic Surgery

## 2018-01-10 ENCOUNTER — Encounter: Payer: Self-pay | Admitting: Cardiovascular Disease

## 2018-01-10 ENCOUNTER — Other Ambulatory Visit: Payer: Self-pay | Admitting: *Deleted

## 2018-01-10 ENCOUNTER — Telehealth: Payer: Self-pay | Admitting: Cardiovascular Disease

## 2018-01-10 ENCOUNTER — Ambulatory Visit (INDEPENDENT_AMBULATORY_CARE_PROVIDER_SITE_OTHER): Payer: Medicare Other

## 2018-01-10 DIAGNOSIS — R002 Palpitations: Secondary | ICD-10-CM | POA: Diagnosis not present

## 2018-01-10 NOTE — Progress Notes (Signed)
ZIO monitor ordered

## 2018-01-10 NOTE — Telephone Encounter (Signed)
Patient c/o Palpitations:  High priority if patient c/o lightheadedness, shortness of breath, or chest pain  1) How long have you had palpitations/irregular HR/ Afib? Are you having the symptoms now?   Are you currently experiencing lightheadedness, SOB or CP?  2) Do you have a history of afib (atrial fibrillation) or irregular heart rhythm 3) Have you checked your BP or HR? (document readings if available):   4) Are you experiencing any other symptoms?   This encounter was created in error - please disregard.

## 2018-01-10 NOTE — Telephone Encounter (Signed)
The patient walked in to clinic today after being seen at the [facility]. She states she was on the elyptical and started to have palpitations.  She was instructed by the [facility] staff to come to the office.  In speaking with the patient, she has been having palpitations intermittently throughout the day for a while.  Her "episodes" last about 3-4 minutes.  She does have some periodic episodes of dizziness as well- she experienced this this morning after taking a shower, but other episodes happen at other times during the day.   I advised the patient that we could place a ZIO monitor on her to assess her rhythm and she was agreeable. Dr. Rockey Situ made aware and agreeable. 14 day ZIO placed on the patient today.  She also wanted to make Dr. Rockey Situ aware that she thinks the rosuvastatin was causing her to break out in a rash.   She was recently tried on 2 different antidepressants and these were stopped due to a rash.  She does not recall when these were stopped, but 2-3 days ago she developed a rash on her neck (off antidepressants) and therefore she stopped the Crestor.  Since stopping the Crestor, her rash has improved.  I advised her I would notify Dr. Rockey Situ of this and we will be back in touch with her with further recommendations.  The patient is agreeable.

## 2018-01-31 ENCOUNTER — Telehealth: Payer: Self-pay | Admitting: Cardiovascular Disease

## 2018-01-31 NOTE — Telephone Encounter (Signed)
Spoke with patient and reviewed preliminary findings and that provider will review and that we would call her with finalized findings. She was very appreciative for the call back with no further questions at this time.

## 2018-01-31 NOTE — Telephone Encounter (Signed)
Patient calling asking if we have her monitor results  Please advise She states she sent it back to the company on the 10th

## 2018-02-27 ENCOUNTER — Other Ambulatory Visit: Payer: Self-pay | Admitting: Internal Medicine

## 2018-03-06 ENCOUNTER — Inpatient Hospital Stay: Payer: Medicare Other | Attending: Internal Medicine

## 2018-03-06 ENCOUNTER — Ambulatory Visit
Admission: RE | Admit: 2018-03-06 | Discharge: 2018-03-06 | Disposition: A | Discharge status: Home / Self Care | Payer: Medicare Other | Source: Ambulatory Visit | Attending: Internal Medicine | Admitting: Internal Medicine

## 2018-03-06 DIAGNOSIS — M810 Age-related osteoporosis without current pathological fracture: Secondary | ICD-10-CM | POA: Diagnosis not present

## 2018-03-06 DIAGNOSIS — C3412 Malignant neoplasm of upper lobe, left bronchus or lung: Secondary | ICD-10-CM | POA: Insufficient documentation

## 2018-03-06 DIAGNOSIS — G8912 Acute post-thoracotomy pain: Secondary | ICD-10-CM | POA: Diagnosis not present

## 2018-03-06 DIAGNOSIS — I2510 Atherosclerotic heart disease of native coronary artery without angina pectoris: Secondary | ICD-10-CM | POA: Diagnosis not present

## 2018-03-06 DIAGNOSIS — I700 Atherosclerosis of aorta: Secondary | ICD-10-CM | POA: Diagnosis not present

## 2018-03-06 DIAGNOSIS — Z79811 Long term (current) use of aromatase inhibitors: Secondary | ICD-10-CM | POA: Diagnosis not present

## 2018-03-06 DIAGNOSIS — C50812 Malignant neoplasm of overlapping sites of left female breast: Secondary | ICD-10-CM | POA: Insufficient documentation

## 2018-03-06 DIAGNOSIS — J432 Centrilobular emphysema: Secondary | ICD-10-CM | POA: Insufficient documentation

## 2018-03-06 DIAGNOSIS — Z170 Estrogen receptor positive status [ER+]: Secondary | ICD-10-CM | POA: Diagnosis not present

## 2018-03-06 LAB — COMPREHENSIVE METABOLIC PANEL
ALK PHOS: 43 U/L (ref 38–126)
ALT: 14 U/L (ref 0–44)
AST: 18 U/L (ref 15–41)
Albumin: 3.9 g/dL (ref 3.5–5.0)
Anion gap: 8 (ref 5–15)
BILIRUBIN TOTAL: 1.3 mg/dL — AB (ref 0.3–1.2)
BUN: 8 mg/dL (ref 8–23)
CO2: 27 mmol/L (ref 22–32)
CREATININE: 0.72 mg/dL (ref 0.44–1.00)
Calcium: 9 mg/dL (ref 8.9–10.3)
Chloride: 102 mmol/L (ref 98–111)
Glucose, Bld: 95 mg/dL (ref 70–99)
Potassium: 4.1 mmol/L (ref 3.5–5.1)
Sodium: 137 mmol/L (ref 135–145)
Total Protein: 7.4 g/dL (ref 6.5–8.1)

## 2018-03-06 LAB — CBC WITH DIFFERENTIAL/PLATELET
Abs Immature Granulocytes: 0.02 10*3/uL (ref 0.00–0.07)
BASOS ABS: 0 10*3/uL (ref 0.0–0.1)
Basophils Relative: 1 %
EOS ABS: 0.1 10*3/uL (ref 0.0–0.5)
EOS PCT: 1 %
HEMATOCRIT: 39.4 % (ref 36.0–46.0)
Hemoglobin: 13.1 g/dL (ref 12.0–15.0)
Immature Granulocytes: 0 %
LYMPHS ABS: 1.9 10*3/uL (ref 0.7–4.0)
Lymphocytes Relative: 25 %
MCH: 32 pg (ref 26.0–34.0)
MCHC: 33.2 g/dL (ref 30.0–36.0)
MCV: 96.1 fL (ref 80.0–100.0)
Monocytes Absolute: 0.5 10*3/uL (ref 0.1–1.0)
Monocytes Relative: 7 %
NRBC: 0 % (ref 0.0–0.2)
Neutro Abs: 5.1 10*3/uL (ref 1.7–7.7)
Neutrophils Relative %: 66 %
Platelets: 204 10*3/uL (ref 150–400)
RBC: 4.1 MIL/uL (ref 3.87–5.11)
RDW: 12.8 % (ref 11.5–15.5)
WBC: 7.6 10*3/uL (ref 4.0–10.5)

## 2018-03-06 MED ORDER — IOHEXOL 300 MG/ML  SOLN
75.0000 mL | Freq: Once | INTRAMUSCULAR | Status: AC | PRN
  Administered 2018-03-06: 75 mL via INTRAVENOUS

## 2018-03-12 ENCOUNTER — Other Ambulatory Visit: Payer: Self-pay

## 2018-03-12 ENCOUNTER — Inpatient Hospital Stay (HOSPITAL_BASED_OUTPATIENT_CLINIC_OR_DEPARTMENT_OTHER): Payer: Medicare Other | Admitting: Internal Medicine

## 2018-03-12 ENCOUNTER — Encounter: Payer: Self-pay | Admitting: Internal Medicine

## 2018-03-12 VITALS — BP 108/75 | HR 77 | Temp 97.6°F | Resp 18 | Ht 65.0 in | Wt 112.4 lb

## 2018-03-12 DIAGNOSIS — Z170 Estrogen receptor positive status [ER+]: Secondary | ICD-10-CM

## 2018-03-12 DIAGNOSIS — C50812 Malignant neoplasm of overlapping sites of left female breast: Secondary | ICD-10-CM | POA: Diagnosis not present

## 2018-03-12 DIAGNOSIS — C3412 Malignant neoplasm of upper lobe, left bronchus or lung: Secondary | ICD-10-CM | POA: Diagnosis not present

## 2018-03-12 DIAGNOSIS — M810 Age-related osteoporosis without current pathological fracture: Secondary | ICD-10-CM

## 2018-03-12 DIAGNOSIS — Z79811 Long term (current) use of aromatase inhibitors: Secondary | ICD-10-CM | POA: Diagnosis not present

## 2018-03-12 DIAGNOSIS — G8912 Acute post-thoracotomy pain: Secondary | ICD-10-CM

## 2018-03-12 MED ORDER — GABAPENTIN 100 MG PO CAPS: 100.0000 mg | ORAL_CAPSULE | Freq: Three times a day (TID) | ORAL | 3 refills | Status: DC

## 2018-03-12 NOTE — Progress Notes (Signed)
[name] OFFICE PROGRESS NOTE  Patient Care Team: Baxter Hire, MD as PCP - General (Internal Medicine)  Cancer Staging No matching staging information was found for the patient.   Oncology History   # 2012- LEFT BREAST [pt1b- STAGE I; G-2];ER/PR-Pos; her 2 Neu-NEG;  RS- 21 [risk of recurrence 13%];s/p Lumpec& SLNBx; NO chemo; s/p RT; On Aromasin  [finish April 2017]   # MAY 2019- ADENO CA LUL STAGE IA [s/p wedge resection; Dr.Oaks; [facility]]  DIAGNOSIS: _0  Breast ca Stage I ER/PR pos; Her 2NEG; LUL adeno stage I  GOALS: curative  CURRENT/MOST RECENT THERAPY- surveillance      Carcinoma of overlapping sites of left breast in female, estrogen receptor positive ([place])    Primary cancer of left upper lobe of lung ([place])      INTERVAL HISTORY:  Katie Franklin 62 y.o.  female pleasant patient above history of breast cancer; and also left upper lobe lung cancer is here for a follow-up/review this of the CT scan.  Patient complains of worsening pain at the site of her thoracic incision.  Also complains of pain radiating into her left breast.  Intermittent comes and goes.  She is awaiting to have a mammogram later next month.   Patient has cut on significant smoking.  Smokes about 1 cigarette a month.  No headaches.  Review of Systems  Constitutional: Negative for chills, diaphoresis, fever, malaise/fatigue and weight loss.  HENT: Negative for nosebleeds and sore throat.   Eyes: Negative for double vision.  Respiratory: Negative for cough, hemoptysis, sputum production, shortness of breath and wheezing.   Cardiovascular: Positive for chest pain. Negative for palpitations, orthopnea and leg swelling.  Gastrointestinal: Negative for abdominal pain, blood in stool, constipation, diarrhea, heartburn, melena, nausea and vomiting.  Genitourinary: Negative for dysuria, frequency and urgency.  Musculoskeletal: Negative for back pain and joint pain.  Skin: Negative.   Negative for itching and rash.  Neurological: Negative for dizziness, tingling, focal weakness, weakness and headaches.  Endo/Heme/Allergies: Does not bruise/bleed easily.  Psychiatric/Behavioral: Negative for depression. The patient is not nervous/anxious and does not have insomnia.       PAST MEDICAL HISTORY :  Past Medical History:  Diagnosis Date  . Breast cancer ([place]) 2012   left breast  . COPD (chronic obstructive pulmonary disease) ([place])   . Deafness in left ear   . Depression   . Diverticulosis   . 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 Family history of adverse reaction to anesthesia    mother got sick  . Gastritis   . GERD (gastroesophageal reflux disease)   . H. pylori infection   . H/O herpes labialis   . Hemorrhoids   . History of chicken pox   . Hyperplastic colon polyp   . Hypertension   . Lung cancer ([place])   . Multinodular goiter (nontoxic)   . Osteoporosis   . Panic disorder   . Personal history of radiation therapy   . Recurrent sinusitis   . Recurrent sinusitis   . Tobacco use   . Tubular adenoma of colon     PAST SURGICAL HISTORY :   Past Surgical History:  Procedure Laterality Date  . BACK SURGERY  2018   lumbar  . BREAST EXCISIONAL BIOPSY Left    negative 1980's twice  . BREAST EXCISIONAL BIOPSY Left    positive 2012  . BUNIONECTOMY Right   . CHOLECYSTECTOMY N/A 06/11/2017   Procedure: LAPAROSCOPIC CHOLECYSTECTOMY;  Surgeon: Herbert Pun, MD;  Location: [facility] ORS;  Service: General;  Laterality: N/A;  . COLONOSCOPY WITH PROPOFOL N/A 12/28/2014   Procedure: COLONOSCOPY WITH PROPOFOL;  Surgeon: Lollie Sails, MD;  Location: [facility] ENDOSCOPY;  Service: Endoscopy;  Laterality: N/A;  . COLONOSCOPY WITH PROPOFOL N/A 08/17/2017   Procedure: COLONOSCOPY WITH PROPOFOL;  Surgeon: Lollie Sails, MD;  Location: [facility] ENDOSCOPY;  Service: Endoscopy;  Laterality: N/A;  . cyst throat     base of tongue  . ESOPHAGOGASTRODUODENOSCOPY    .  ESOPHAGOGASTRODUODENOSCOPY (EGD) WITH PROPOFOL N/A 12/02/2015   Procedure: ESOPHAGOGASTRODUODENOSCOPY (EGD) WITH PROPOFOL;  Surgeon: Lollie Sails, MD;  Location: [facility] ENDOSCOPY;  Service: Endoscopy;  Laterality: N/A;  . ESOPHAGOGASTRODUODENOSCOPY (EGD) WITH PROPOFOL N/A 04/13/2017   Procedure: ESOPHAGOGASTRODUODENOSCOPY (EGD) WITH PROPOFOL;  Surgeon: Lollie Sails, MD;  Location: [facility] ENDOSCOPY;  Service: Endoscopy;  Laterality: N/A;  . FLEXIBLE BRONCHOSCOPY N/A 08/27/2017   Procedure: PREOP BRONCHOSCOPY;  Surgeon: Nestor Lewandowsky, MD;  Location: [facility] ORS;  Service: Thoracic;  Laterality: N/A;  . GANGLION CYST EXCISION Right    wrist  . Incision tendon sheath for trigger finger Left   . LUMBAR LAMINECTOMY/DECOMPRESSION MICRODISCECTOMY Right 11/06/2016   Procedure: RIGHT L5/S1 HEMILAMINECTOMY AND CYST RESECTION L5/S1;  Surgeon: Deetta Perla, MD;  Location: [facility] ORS;  Service: Neurosurgery;  Laterality: Right;  . MASTECTOMY, PARTIAL Left    left node dissection  . NASAL SEPTUM SURGERY    . THORACOTOMY Left 08/27/2017   Procedure: THORACOTOMY MAJOR;  Surgeon: Nestor Lewandowsky, MD;  Location: [facility] ORS;  Service: Thoracic;  Laterality: Left;  . TONSILLECTOMY    . TRIGGER FINGER RELEASE Left     FAMILY HISTORY :   Family History  Problem Relation Age of Onset  . Breast cancer Maternal Aunt 90       great aunt  . Prostate cancer Father   . Hypertension Father   . Heart disease Father   . Alzheimer's disease Mother   . Heart disease Mother   . Hypertension Mother   . Colon polyps Mother   . Depression Brother        brother #2    SOCIAL HISTORY:   Social History   Tobacco Use  . Smoking status: Former Smoker    Packs/day: 1.00    Years: 45.00    Pack years: 45.00    Types: Cigarettes    Last attempt to quit: 07/26/2017    Years since quitting: 0.6  . Smokeless tobacco: Never Used  Substance Use Topics  . Alcohol use: No    Alcohol/week: 0.0 standard drinks  . Drug use: No     ALLERGIES:  is allergic to erythromycin; septra ds [sulfamethoxazole-trimethoprim]; sulfa antibiotics; tetracyclines & related; demeclocycline; and latex.  MEDICATIONS:  Current Outpatient Medications  Medication Sig Dispense Refill  . acetaminophen (TYLENOL) 500 MG tablet Take 500-1,000 mg by mouth 2 (two) times daily as needed for moderate pain (FOR HEADACHES/BACK PAIN.).     Marland Kitchen albuterol (PROVENTIL HFA;VENTOLIN HFA) 108 (90 BASE) MCG/ACT inhaler Inhale 2 puffs into the lungs every 6 (six) hours as needed for wheezing or shortness of breath.     . ALPRAZolam (XANAX) 0.25 MG tablet Take 0.25 mg by mouth 3 (three) times daily as needed for anxiety.     Marland Kitchen atenolol (TENORMIN) 50 MG tablet Take 50 mg by mouth daily.    . Calcium Carbonate-Vitamin D (CALTRATE 600+D PO) Take 1 tablet by mouth daily. Chewable    . dicyclomine (BENTYL)  10 MG capsule Take 10 mg by mouth 4 (four) times daily as needed for spasms (FOR ABDOMINAL SPASM.).     Marland Kitchen eletriptan (RELPAX) 40 MG tablet Take 40 mg by mouth as needed for migraine or headache. May repeat in 2 hours if headache persists or recurs.    Marland Kitchen Lifitegrast (XIIDRA) 5 % SOLN Place 1 drop into both eyes 2 (two) times daily as needed (for excessively dry eyes.).    Marland Kitchen pantoprazole (PROTONIX) 40 MG tablet Take 40 mg by mouth daily.    Marland Kitchen Propylene Glycol-Glycerin (SOOTHE) 0.6-0.6 % SOLN Place 1-2 drops into both eyes 5 (five) times daily as needed (for dry eyes.). SOOTHE XP    . spironolactone (ALDACTONE) 50 MG tablet Take 25 mg by mouth daily.     . sucralfate (CARAFATE) 1 GM/10ML suspension Take 1 g by mouth 3 (three) times daily - between meals and at bedtime.     . vitamin B-12 (CYANOCOBALAMIN) 1000 MCG tablet Take 1,000 mcg by mouth daily.    . vitamin E 1000 UNIT capsule Take 1,000 Units by mouth daily.    Marland Kitchen zolpidem (AMBIEN) 10 MG tablet Take 10 mg by mouth at bedtime as needed for sleep.    Marland Kitchen gabapentin (NEURONTIN) 100 MG capsule Take 1 capsule (100 mg  total) by mouth 3 (three) times daily. 90 capsule 3  . rosuvastatin (CRESTOR) 10 MG tablet Take 1 tablet (10 mg total) by mouth daily. 90 tablet 3  . traMADol (ULTRAM) 50 MG tablet Take 1 tablet (50 mg total) by mouth every 6 (six) hours as needed. (Patient not taking: Reported on 03/12/2018) 30 tablet 0   No current facility-administered medications for this visit.     PHYSICAL EXAMINATION: ECOG PERFORMANCE STATUS: 1 - Symptomatic but completely ambulatory  BP 108/75   Pulse 77   Temp 97.6 F (36.4 C) (Tympanic)   Resp 18   Ht _0  (1.651 m)   Wt 112 lb 7 oz (51 kg)   BMI 18.71 kg/m   Filed Weights   03/12/18 1409  Weight: 112 lb 7 oz (51 kg)    Physical Exam  Constitutional: She is oriented to person, place, and time and well-developed, well-nourished, and in no distress.  HENT:  Head: Normocephalic and atraumatic.  Mouth/Throat: Oropharynx is clear and moist. No oropharyngeal exudate.  Eyes: Pupils are equal, round, and reactive to light.  Neck: Normal range of motion. Neck supple.  Cardiovascular: Normal rate and regular rhythm.  Pulmonary/Chest: No respiratory distress. She has no wheezes.  Abdominal: Soft. Bowel sounds are normal. She exhibits no distension and no mass. There is no tenderness. There is no rebound and no guarding.  Musculoskeletal: Normal range of motion. She exhibits no edema or tenderness.  Neurological: She is alert and oriented to person, place, and time.  Skin: Skin is warm.  Right and left BREAST exam (in the presence of nurse)- no unusual skin changes or dominant masses felt. Surgical scars noted.    Psychiatric: Affect normal.       LABORATORY DATA:  I have reviewed the data as listed    Component Value Date/Time   NA 137 03/06/2018 1302   NA 136 02/06/2012 0957   K 4.1 03/06/2018 1302   K 3.8 02/06/2012 0957   CL 102 03/06/2018 1302   CO2 27 03/06/2018 1302   GLUCOSE 95 03/06/2018 1302   BUN 8 03/06/2018 1302   CREATININE 0.72  03/06/2018 1302   CREATININE 0.97 11/12/2013  1508   CALCIUM 9.0 03/06/2018 1302   CALCIUM 9.5 05/08/2012 1018   PROT 7.4 03/06/2018 1302   PROT 7.6 11/12/2013 1508   ALBUMIN 3.9 03/06/2018 1302   ALBUMIN 3.7 11/12/2013 1508   AST 18 03/06/2018 1302   AST 10 (L) 11/12/2013 1508   ALT 14 03/06/2018 1302   ALT 15 11/12/2013 1508   ALKPHOS 43 03/06/2018 1302   ALKPHOS 49 11/12/2013 1508   BILITOT 1.3 (H) 03/06/2018 1302   BILITOT 0.7 11/12/2013 1508   GFRNONAA >60 03/06/2018 1302   GFRNONAA >60 11/12/2013 1508   GFRAA >60 03/06/2018 1302   GFRAA >60 11/12/2013 1508    No results found for: SPEP, UPEP  Lab Results  Component Value Date   WBC 7.6 03/06/2018   NEUTROABS 5.1 03/06/2018   HGB 13.1 03/06/2018   HCT 39.4 03/06/2018   MCV 96.1 03/06/2018   PLT 204 03/06/2018      Chemistry      Component Value Date/Time   NA 137 03/06/2018 1302   NA 136 02/06/2012 0957   K 4.1 03/06/2018 1302   K 3.8 02/06/2012 0957   CL 102 03/06/2018 1302   CO2 27 03/06/2018 1302   BUN 8 03/06/2018 1302   CREATININE 0.72 03/06/2018 1302   CREATININE 0.97 11/12/2013 1508      Component Value Date/Time   CALCIUM 9.0 03/06/2018 1302   CALCIUM 9.5 05/08/2012 1018   ALKPHOS 43 03/06/2018 1302   ALKPHOS 49 11/12/2013 1508   AST 18 03/06/2018 1302   AST 10 (L) 11/12/2013 1508   ALT 14 03/06/2018 1302   ALT 15 11/12/2013 1508   BILITOT 1.3 (H) 03/06/2018 1302   BILITOT 0.7 11/12/2013 1508       RADIOGRAPHIC STUDIES: I have personally reviewed the radiological images as listed and agreed with the findings in the report. No results found.   ASSESSMENT & PLAN:  Primary cancer of left upper lobe of lung ([place]) #Left upper lobe adenocarcinoma stage I; NOV 2019- CT chest NED;   # Continue surveillance.  We will repeat imaging in 6 months.  Order next visit.  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   #History of breast cancer stage  I-currently status post Arimidex; no evidence of recurrence.  Stable.  # BMD- April 2019- Osteoporosis- ca+vit D BID; stopped PO bisphosphonate sec to GI issues; On reclast every 12 months; defer to PCP.   # Follow up discussed; wants to follow in [place].    # DISPOSITION: #Recommend follow-up in 3 months/ no labs;[facility]-Dr.B   No orders of the defined types were placed in this encounter.  All questions were answered. The patient knows to call the clinic with any problems, questions or concerns.      Cammie Sickle, MD 03/12/2018 3:20 PM

## 2018-03-12 NOTE — Assessment & Plan Note (Addendum)
#  Left upper lobe adenocarcinoma stage I; NOV 2019- CT chest NED;   # Continue surveillance.  We will repeat imaging in 6 months.  Order next visit.  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   #History of breast cancer stage I-currently status post Arimidex; no evidence of recurrence.  Stable.  # BMD- April 2019- Osteoporosis- ca+vit D BID; stopped PO bisphosphonate sec to GI issues; On reclast every 12 months; defer to PCP.   # Follow up discussed; wants to follow in [place].    # DISPOSITION: #Recommend follow-up in 3 months/ no labs;[facility]-Dr.B

## 2018-03-19 ENCOUNTER — Ambulatory Visit
Admission: RE | Admit: 2018-03-19 | Discharge: 2018-03-19 | Disposition: A | Discharge status: Home / Self Care | Payer: Medicare Other | Source: Ambulatory Visit | Attending: Internal Medicine | Admitting: Internal Medicine

## 2018-03-19 DIAGNOSIS — C50812 Malignant neoplasm of overlapping sites of left female breast: Secondary | ICD-10-CM

## 2018-03-19 DIAGNOSIS — Z170 Estrogen receptor positive status [ER+]: Principal | ICD-10-CM

## 2018-05-31 ENCOUNTER — Other Ambulatory Visit (HOSPITAL_COMMUNITY): Payer: Self-pay | Admitting: Physical Medicine and Rehabilitation

## 2018-05-31 ENCOUNTER — Other Ambulatory Visit: Payer: Self-pay | Admitting: Physical Medicine and Rehabilitation

## 2018-05-31 DIAGNOSIS — M5416 Radiculopathy, lumbar region: Secondary | ICD-10-CM

## 2018-06-11 ENCOUNTER — Encounter: Payer: Self-pay | Admitting: Internal Medicine

## 2018-06-11 ENCOUNTER — Inpatient Hospital Stay: Payer: Medicare Other | Attending: Internal Medicine | Admitting: Internal Medicine

## 2018-06-11 VITALS — BP 124/85 | HR 68 | Temp 97.1°F | Resp 16 | Wt 114.6 lb

## 2018-06-11 DIAGNOSIS — Z170 Estrogen receptor positive status [ER+]: Secondary | ICD-10-CM | POA: Diagnosis not present

## 2018-06-11 DIAGNOSIS — M810 Age-related osteoporosis without current pathological fracture: Secondary | ICD-10-CM | POA: Diagnosis not present

## 2018-06-11 DIAGNOSIS — C3412 Malignant neoplasm of upper lobe, left bronchus or lung: Secondary | ICD-10-CM | POA: Diagnosis not present

## 2018-06-11 DIAGNOSIS — C50812 Malignant neoplasm of overlapping sites of left female breast: Secondary | ICD-10-CM | POA: Insufficient documentation

## 2018-06-11 MED ORDER — GABAPENTIN 100 MG PO CAPS: ORAL_CAPSULE | ORAL | 3 refills | Status: DC

## 2018-06-11 NOTE — Assessment & Plan Note (Addendum)
#  Left upper lobe adenocarcinoma stage I; NOV 2019- CT chest NED; STABLE.  # continue surveillance/ recommend CT scan in 3 months; will order today.   #Post thoracotomy pain-worse; currently on tramadol; continue neurontin 200 mg in AM; 1 in PM>; new script given.   #History of breast cancer stage I-currently status post Arimidex; no evidence of recurrence. STABLE.   # BMD- April 2019- Osteoporosis- ca+vit D BID; On reclast every 12 months/PCP.   # URI- s/p Dr.McQueen eva- s/p steroids/ anti-biotics.   # back pain- Dr.Chasnis- awaiting MRI back tomorrow.   # DISPOSITION: #Recommend follow-up in 3 months/ no labs; CT prior; Dr.B

## 2018-06-11 NOTE — Progress Notes (Signed)
[place] OFFICE PROGRESS NOTE  Patient Care Team: Baxter Hire, MD as PCP - General (Internal Medicine)  Cancer Staging No matching staging information was found for the patient.   Oncology History   # 2012- LEFT BREAST [pt1b- STAGE I; G-2];ER/PR-Pos; her 2 Neu-NEG;  RS- 21 [risk of recurrence 13%];s/p Lumpec& SLNBx; NO chemo; s/p RT; On Aromasin  [finish April 2017]  # MAY 2019- [place] CA LUL STAGE IA [s/p wedge resection; Dr.Oaks; [facility]]  # Osteoporosis- stopped PO bisphosphonate sec to GI issues; On reclast every 12 months; defer to PCP.    DIAGNOSIS: _0  Breast ca Stage I ER/PR pos; Her 2NEG; LUL adeno stage I  GOALS: curative  CURRENT/MOST RECENT THERAPY- surveillance      Carcinoma of overlapping sites of left breast in female, estrogen receptor positive ([place])    Primary cancer of left upper lobe of lung ([place])      INTERVAL HISTORY:  LEVENIA SKALICKY 63 y.o.  female pleasant patient above history of breast cancer; and also left upper lobe lung cancer is here for a follow-up.  Patient complains of worsening pain in the left chest wall region at the site of previous thoracic surgery.  Patient has noted some improvement of the pain on Neurontin not resolved.  Unfortunately she continues to smoke although significantly improved from baseline.   Patient had recent episode of URI cough sinus congestion for which she was evaluated by ENT.  She reports antibiotics and steroids.  Improved.  Otherwise no new shortness of breath or cough.  Gaining weight.  Review of Systems  Constitutional: Negative for chills, diaphoresis, fever, malaise/fatigue and weight loss.  HENT: Negative for nosebleeds and sore throat.   Eyes: Negative for double vision.  Respiratory: Negative for cough, hemoptysis, sputum production, shortness of breath and wheezing.   Cardiovascular: Positive for chest pain. Negative for palpitations, orthopnea and leg swelling.  Gastrointestinal:  Negative for abdominal pain, blood in stool, constipation, diarrhea, heartburn, melena, nausea and vomiting.  Genitourinary: Negative for dysuria, frequency and urgency.  Musculoskeletal: Negative for back pain and joint pain.  Skin: Negative.  Negative for itching and rash.  Neurological: Negative for dizziness, tingling, focal weakness, weakness and headaches.  Endo/Heme/Allergies: Does not bruise/bleed easily.  Psychiatric/Behavioral: Negative for depression. The patient is not nervous/anxious and does not have insomnia.       PAST MEDICAL HISTORY :  Past Medical History:  Diagnosis Date  . Breast cancer ([place]) 2012   left breast  . COPD (chronic obstructive pulmonary disease) ([place])   . Deafness in left ear   . Depression   . Diverticulosis   . 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 Family history of adverse reaction to anesthesia    mother got sick  . Gastritis   . GERD (gastroesophageal reflux disease)   . H. pylori infection   . H/O herpes labialis   . Hemorrhoids   . History of chicken pox   . Hyperplastic colon polyp   . Hypertension   . Lung cancer ([place]) 08/2017   surgery only, no rad or chemo tx  . Multinodular goiter (nontoxic)   . Osteoporosis   . Panic disorder   . Personal history of radiation therapy   . Recurrent sinusitis   . Recurrent sinusitis   . Tobacco use   . Tubular adenoma of colon     PAST SURGICAL HISTORY :   Past Surgical History:  Procedure Laterality Date  .  BACK SURGERY  2018   lumbar  . BREAST EXCISIONAL BIOPSY Left    negative 1980's twice  . BREAST EXCISIONAL BIOPSY Left 2012   [facility]  . BREAST LUMPECTOMY Left 2012   [facility], clear margins, LN neg  . BUNIONECTOMY Right   . CHOLECYSTECTOMY N/A 06/11/2017   Procedure: LAPAROSCOPIC CHOLECYSTECTOMY;  Surgeon: Herbert Pun, MD;  Location: [facility] ORS;  Service: General;  Laterality: N/A;  . COLONOSCOPY WITH PROPOFOL N/A 12/28/2014   Procedure: COLONOSCOPY WITH PROPOFOL;   Surgeon: Lollie Sails, MD;  Location: [facility] ENDOSCOPY;  Service: Endoscopy;  Laterality: N/A;  . COLONOSCOPY WITH PROPOFOL N/A 08/17/2017   Procedure: COLONOSCOPY WITH PROPOFOL;  Surgeon: Lollie Sails, MD;  Location: [facility] ENDOSCOPY;  Service: Endoscopy;  Laterality: N/A;  . cyst throat     base of tongue  . ESOPHAGOGASTRODUODENOSCOPY    . ESOPHAGOGASTRODUODENOSCOPY (EGD) WITH PROPOFOL N/A 12/02/2015   Procedure: ESOPHAGOGASTRODUODENOSCOPY (EGD) WITH PROPOFOL;  Surgeon: Lollie Sails, MD;  Location: [facility] ENDOSCOPY;  Service: Endoscopy;  Laterality: N/A;  . ESOPHAGOGASTRODUODENOSCOPY (EGD) WITH PROPOFOL N/A 04/13/2017   Procedure: ESOPHAGOGASTRODUODENOSCOPY (EGD) WITH PROPOFOL;  Surgeon: Lollie Sails, MD;  Location: [facility] ENDOSCOPY;  Service: Endoscopy;  Laterality: N/A;  . FLEXIBLE BRONCHOSCOPY N/A 08/27/2017   Procedure: PREOP BRONCHOSCOPY;  Surgeon: Nestor Lewandowsky, MD;  Location: [facility] ORS;  Service: Thoracic;  Laterality: N/A;  . GANGLION CYST EXCISION Right    wrist  . Incision tendon sheath for trigger finger Left   . LUMBAR LAMINECTOMY/DECOMPRESSION MICRODISCECTOMY Right 11/06/2016   Procedure: RIGHT L5/S1 HEMILAMINECTOMY AND CYST RESECTION L5/S1;  Surgeon: Deetta Perla, MD;  Location: [facility] ORS;  Service: Neurosurgery;  Laterality: Right;  . NASAL SEPTUM SURGERY    . THORACOTOMY Left 08/27/2017   Procedure: THORACOTOMY MAJOR;  Surgeon: Nestor Lewandowsky, MD;  Location: [facility] ORS;  Service: Thoracic;  Laterality: Left;  . TONSILLECTOMY    . TRIGGER FINGER RELEASE Left     FAMILY HISTORY :   Family History  Problem Relation Age of Onset  . Breast cancer Maternal Aunt 90       great aunt  . Prostate cancer Father   . Hypertension Father   . Heart disease Father   . Alzheimer's disease Mother   . Heart disease Mother   . Hypertension Mother   . Colon polyps Mother   . Depression Brother        brother #2    SOCIAL HISTORY:   Social History   Tobacco Use  . Smoking  status: Former Smoker    Packs/day: 1.00    Years: 45.00    Pack years: 45.00    Types: Cigarettes    Last attempt to quit: 07/26/2017    Years since quitting: 0.8  . Smokeless tobacco: Never Used  Substance Use Topics  . Alcohol use: No    Alcohol/week: 0.0 standard drinks  . Drug use: No    ALLERGIES:  is allergic to erythromycin; septra ds [sulfamethoxazole-trimethoprim]; sulfa antibiotics; tetracyclines & related; demeclocycline; and latex.  MEDICATIONS:  Current Outpatient Medications  Medication Sig Dispense Refill  . acetaminophen (TYLENOL) 500 MG tablet Take 500-1,000 mg by mouth 2 (two) times daily as needed for moderate pain (FOR HEADACHES/BACK PAIN.).     Marland Kitchen albuterol (PROVENTIL HFA;VENTOLIN HFA) 108 (90 BASE) MCG/ACT inhaler Inhale 2 puffs into the lungs every 6 (six) hours as needed for wheezing or shortness of breath.     . ALPRAZolam (XANAX) 0.25 MG tablet Take 0.25 mg by mouth  3 (three) times daily as needed for anxiety.     Marland Kitchen atenolol (TENORMIN) 50 MG tablet Take 50 mg by mouth daily.    . Calcium Carbonate-Vitamin D (CALTRATE 600+D PO) Take 1 tablet by mouth daily. Chewable    . dicyclomine (BENTYL) 10 MG capsule Take 10 mg by mouth 4 (four) times daily as needed for spasms (FOR ABDOMINAL SPASM.).     Marland Kitchen eletriptan (RELPAX) 40 MG tablet Take 40 mg by mouth as needed for migraine or headache. May repeat in 2 hours if headache persists or recurs.    . gabapentin (NEURONTIN) 100 MG capsule Take 2 pills in am ; and 1 pill in PM. 90 capsule 3  . Lifitegrast (XIIDRA) 5 % SOLN Place 1 drop into both eyes 2 (two) times daily as needed (for excessively dry eyes.).    Marland Kitchen pantoprazole (PROTONIX) 40 MG tablet Take 40 mg by mouth daily.    Marland Kitchen Propylene Glycol-Glycerin (SOOTHE) 0.6-0.6 % SOLN Place 1-2 drops into both eyes 5 (five) times daily as needed (for dry eyes.). SOOTHE XP    . rosuvastatin (CRESTOR) 10 MG tablet Take 1 tablet (10 mg total) by mouth daily. 90 tablet 3  .  spironolactone (ALDACTONE) 50 MG tablet Take 25 mg by mouth daily.     . sucralfate (CARAFATE) 1 GM/10ML suspension Take 1 g by mouth 3 (three) times daily - between meals and at bedtime.     . traMADol (ULTRAM) 50 MG tablet Take 1 tablet (50 mg total) by mouth every 6 (six) hours as needed. (Patient not taking: Reported on 03/12/2018) 30 tablet 0  . vitamin B-12 (CYANOCOBALAMIN) 1000 MCG tablet Take 1,000 mcg by mouth daily.    . vitamin E 1000 UNIT capsule Take 1,000 Units by mouth daily.    Marland Kitchen zolpidem (AMBIEN) 10 MG tablet Take 10 mg by mouth at bedtime as needed for sleep.     No current facility-administered medications for this visit.     PHYSICAL EXAMINATION: ECOG PERFORMANCE STATUS: 1 - Symptomatic but completely ambulatory  BP 124/85 (BP Location: Left Arm, Patient Position: Sitting, Cuff Size: Normal)   Pulse 68   Temp (!) 97.1 F (36.2 C) (Tympanic)   Resp 16   Wt 114 lb 9.6 oz (52 kg)   BMI 19.07 kg/m   Filed Weights   06/11/18 1332  Weight: 114 lb 9.6 oz (52 kg)    Physical Exam  Constitutional: She is oriented to person, place, and time and well-developed, well-nourished, and in no distress.  HENT:  Head: Normocephalic and atraumatic.  Mouth/Throat: Oropharynx is clear and moist. No oropharyngeal exudate.  Eyes: Pupils are equal, round, and reactive to light.  Neck: Normal range of motion. Neck supple.  Cardiovascular: Normal rate and regular rhythm.  Pulmonary/Chest: No respiratory distress. She has no wheezes.  Abdominal: Soft. Bowel sounds are normal. She exhibits no distension and no mass. There is no abdominal tenderness. There is no rebound and no guarding.  Musculoskeletal: Normal range of motion.        General: No tenderness or edema.  Neurological: She is alert and oriented to person, place, and time.  Skin: Skin is warm.  Right and left BREAST exam (in the presence of nurse)- no unusual skin changes or dominant masses felt. Surgical scars noted.     Psychiatric: Affect normal.       LABORATORY DATA:  I have reviewed the data as listed    Component Value Date/Time  NA 137 03/06/2018 1302   NA 136 02/06/2012 0957   K 4.1 03/06/2018 1302   K 3.8 02/06/2012 0957   CL 102 03/06/2018 1302   CO2 27 03/06/2018 1302   GLUCOSE 95 03/06/2018 1302   BUN 8 03/06/2018 1302   CREATININE 0.72 03/06/2018 1302   CREATININE 0.97 11/12/2013 1508   CALCIUM 9.0 03/06/2018 1302   CALCIUM 9.5 05/08/2012 1018   PROT 7.4 03/06/2018 1302   PROT 7.6 11/12/2013 1508   ALBUMIN 3.9 03/06/2018 1302   ALBUMIN 3.7 11/12/2013 1508   AST 18 03/06/2018 1302   AST 10 (L) 11/12/2013 1508   ALT 14 03/06/2018 1302   ALT 15 11/12/2013 1508   ALKPHOS 43 03/06/2018 1302   ALKPHOS 49 11/12/2013 1508   BILITOT 1.3 (H) 03/06/2018 1302   BILITOT 0.7 11/12/2013 1508   GFRNONAA >60 03/06/2018 1302   GFRNONAA >60 11/12/2013 1508   GFRAA >60 03/06/2018 1302   GFRAA >60 11/12/2013 1508    No results found for: SPEP, UPEP  Lab Results  Component Value Date   WBC 7.6 03/06/2018   NEUTROABS 5.1 03/06/2018   HGB 13.1 03/06/2018   HCT 39.4 03/06/2018   MCV 96.1 03/06/2018   PLT 204 03/06/2018      Chemistry      Component Value Date/Time   NA 137 03/06/2018 1302   NA 136 02/06/2012 0957   K 4.1 03/06/2018 1302   K 3.8 02/06/2012 0957   CL 102 03/06/2018 1302   CO2 27 03/06/2018 1302   BUN 8 03/06/2018 1302   CREATININE 0.72 03/06/2018 1302   CREATININE 0.97 11/12/2013 1508      Component Value Date/Time   CALCIUM 9.0 03/06/2018 1302   CALCIUM 9.5 05/08/2012 1018   ALKPHOS 43 03/06/2018 1302   ALKPHOS 49 11/12/2013 1508   AST 18 03/06/2018 1302   AST 10 (L) 11/12/2013 1508   ALT 14 03/06/2018 1302   ALT 15 11/12/2013 1508   BILITOT 1.3 (H) 03/06/2018 1302   BILITOT 0.7 11/12/2013 1508       RADIOGRAPHIC STUDIES: I have personally reviewed the radiological images as listed and agreed with the findings in the report. No results found.    ASSESSMENT & PLAN:  Primary cancer of left upper lobe of lung ([place]) #Left upper lobe adenocarcinoma stage I; NOV 2019- CT chest NED; STABLE.  # continue surveillance/ recommend CT scan in 3 months; will order today.   #Post thoracotomy pain-worse; currently on tramadol; continue neurontin 200 mg in AM; 1 in PM>; new script given.   #History of breast cancer stage I-currently status post Arimidex; no evidence of recurrence. STABLE.   # BMD- April 2019- Osteoporosis- ca+vit D BID; On reclast every 12 months/PCP.   # URI- s/p Dr.McQueen eva- s/p steroids/ anti-biotics.   # back pain- Dr.Chasnis- awaiting MRI back tomorrow.   # DISPOSITION: #Recommend follow-up in 3 months/ no labs; CT prior; Dr.B   Orders Placed This Encounter  Procedures  . CT CHEST W CONTRAST    Standing Status:   Future    Standing Expiration Date:   06/12/2019    Order Specific Question:   If indicated for the ordered procedure, I authorize the administration of contrast media per Radiology protocol    Answer:   Yes    Order Specific Question:   Preferred imaging location?    Answer:   [facility]    Order Specific Question:   Radiology Contrast Protocol - do  NOT remove file path    Answer:   [email or web address]    Order Specific Question:   ** REASON FOR EXAM (FREE TEXT)    Answer:   lung cancer   All questions were answered. The patient knows to call the clinic with any problems, questions or concerns.      Cammie Sickle, MD 06/11/2018 1:52 PM

## 2018-06-12 ENCOUNTER — Ambulatory Visit
Admission: RE | Admit: 2018-06-12 | Discharge: 2018-06-12 | Disposition: A | Discharge status: Home / Self Care | Payer: Medicare Other | Source: Ambulatory Visit | Attending: Physical Medicine and Rehabilitation | Admitting: Physical Medicine and Rehabilitation

## 2018-06-12 DIAGNOSIS — M5416 Radiculopathy, lumbar region: Secondary | ICD-10-CM | POA: Diagnosis present

## 2018-06-28 ENCOUNTER — Ambulatory Visit: Payer: Medicare Other | Admitting: Internal Medicine

## 2018-06-28 ENCOUNTER — Other Ambulatory Visit: Payer: Medicare Other

## 2018-09-06 ENCOUNTER — Ambulatory Visit
Admission: RE | Admit: 2018-09-06 | Discharge: 2018-09-06 | Disposition: A | Discharge status: Home / Self Care | Payer: Medicare Other | Source: Ambulatory Visit | Attending: Internal Medicine | Admitting: Internal Medicine

## 2018-09-06 ENCOUNTER — Other Ambulatory Visit: Payer: Self-pay

## 2018-09-06 DIAGNOSIS — C3412 Malignant neoplasm of upper lobe, left bronchus or lung: Secondary | ICD-10-CM | POA: Diagnosis present

## 2018-09-06 LAB — POCT I-STAT CREATININE: Creatinine, Ser: 0.8 mg/dL (ref 0.44–1.00)

## 2018-09-06 MED ORDER — IOHEXOL 300 MG/ML  SOLN
75.0000 mL | Freq: Once | INTRAMUSCULAR | Status: AC | PRN
  Administered 2018-09-06: 11:00:00 75 mL via INTRAVENOUS

## 2018-09-09 ENCOUNTER — Other Ambulatory Visit: Payer: Medicare Other

## 2018-09-10 ENCOUNTER — Other Ambulatory Visit: Payer: Self-pay

## 2018-09-10 ENCOUNTER — Telehealth: Payer: Self-pay | Admitting: *Deleted

## 2018-09-10 ENCOUNTER — Encounter: Payer: Self-pay | Admitting: Internal Medicine

## 2018-09-10 ENCOUNTER — Inpatient Hospital Stay: Payer: Medicare Other | Attending: Internal Medicine | Admitting: Internal Medicine

## 2018-09-10 DIAGNOSIS — C3412 Malignant neoplasm of upper lobe, left bronchus or lung: Secondary | ICD-10-CM

## 2018-09-10 NOTE — Telephone Encounter (Signed)
Patient called stating she forgot to tell Dr B that after her CT scan, her neck swelled and got sore, but went down. She was not sur ef he wanted to to talk to her about this or not.

## 2018-09-10 NOTE — Progress Notes (Signed)
I connected with Katie Franklin on 09/10/18 at  1:30 PM EDT by telephone visit and verified that I am speaking with the correct person using two identifiers.  I discussed the limitations, risks, security and privacy concerns of performing an evaluation and management service by telemedicine and the availability of in-person appointments. I also discussed with the patient that there may be a patient responsible charge related to this service. The patient expressed understanding and agreed to proceed.    Other persons participating in the visit and their role in the encounter: none  Patient's location: home  Provider's location: home   Oncology History   # 2012- LEFT BREAST [pt1b- STAGE I; G-2];ER/PR-Pos; her 2 Neu-NEG;  RS- 21 [risk of recurrence 13%];s/p Lumpec& SLNBx; NO chemo; s/p RT; On Aromasin  [finish April 2017]  # MAY 2019- [place] CA LUL STAGE IA [s/p wedge resection; Dr.Oaks; [facility]]  # Osteoporosis- stopped PO bisphosphonate sec to GI issues; On reclast every 12 months; defer to PCP.    DIAGNOSIS: [ ]  Breast ca Stage I ER/PR pos; Her 2NEG; LUL adeno stage I  GOALS: curative  CURRENT/MOST RECENT THERAPY- surveillance      Carcinoma of overlapping sites of left breast in female, estrogen receptor positive ([place])    Primary cancer of left upper lobe of lung [facility])   Chief Complaint: Lung cancer  History of present illness:Katie Franklin 63 y.o.  female with history of lung cancer currently in surveillance is here for follow-up.  Patient denies any worsening shortness of breath or cough.  Denies any headaches or nausea vomiting.  Complains of continued chest wall pain at the site of thoracic surgery.  Continues to be Neurontin/tramadol.   Patient complains of some chronic back pain which is not any worse.  Observation/objective:  Assessment and plan: Primary cancer of left upper lobe of lung ([place]) #Left upper lobe adenocarcinoma stage I; May 2020- CT chest NED; stable.   #  Bil Lower Lung nodules- likely benign- continue surveillance.   #Post thoracotomy pain-stable; currently on tramadol; continue neurontin 200 mg in AM; 1 in PM; STABLE.   # History of breast cancer stage I- stable.   # BMD- April 2019- Osteoporosis- ca+vit D BID; On reclast every 12 months/PCP; stable.   # back pain- Dr.Chasnis- stable.    # DISPOSITION: # Follow-up in 6 months-MD/CT chest prior- Dr.B   Follow-up instructions:  I discussed the assessment and treatment plan with the patient.  The patient was provided an opportunity to ask questions and all were answered.  The patient agreed with the plan and demonstrated understanding of instructions.  The patient was advised to call back or seek an in person evaluation if the symptoms worsen or if the condition fails to improve as anticipated.  I provided 12 minutes of non face-to-face telephone visit time during this encounter, and > 50% was spent counseling as documented under my assessment & plan.   Dr. Charlaine Dalton [place] at [facility] 09/10/2018 1:36 PM

## 2018-09-10 NOTE — Assessment & Plan Note (Addendum)
#  Left upper lobe adenocarcinoma stage I; May 2020- CT chest NED; stable.   # Bil Lower Lung nodules- likely benign-CT May 2020- stable;  continue surveillance.   #Post thoracotomy pain-stable; currently on tramadol; continue neurontin 200 mg in AM; 1 in PM; STABLE.   # History of breast cancer stage I- stable.   # BMD- April 2019- Osteoporosis- ca+vit D BID; On reclast every 12 months/PCP; stable.   # back pain- Dr.Chasnis- stable.    # DISPOSITION: # Follow-up in 6 months-MD/CT chest prior- Dr.B

## 2019-01-10 ENCOUNTER — Telehealth: Payer: Self-pay | Admitting: *Deleted

## 2019-01-10 DIAGNOSIS — C50812 Malignant neoplasm of overlapping sites of left female breast: Secondary | ICD-10-CM

## 2019-01-10 NOTE — Telephone Encounter (Signed)
Patient called reporting that she needs an order and appointment for Mammogram on or after 12/4 20 and wold like a return call with the appointment.

## 2019-01-13 NOTE — Telephone Encounter (Signed)
Mammogram orders entered. Colette, pls schedule this apt. Thanks.

## 2019-02-19 ENCOUNTER — Other Ambulatory Visit: Payer: Self-pay | Admitting: Unknown Physician Specialty

## 2019-02-19 DIAGNOSIS — M542 Cervicalgia: Secondary | ICD-10-CM

## 2019-02-24 ENCOUNTER — Ambulatory Visit
Admission: RE | Admit: 2019-02-24 | Discharge: 2019-02-24 | Disposition: A | Discharge status: Home / Self Care | Payer: Medicare Other | Source: Ambulatory Visit | Attending: Unknown Physician Specialty | Admitting: Unknown Physician Specialty

## 2019-02-24 ENCOUNTER — Other Ambulatory Visit: Payer: Self-pay

## 2019-02-24 DIAGNOSIS — M542 Cervicalgia: Secondary | ICD-10-CM

## 2019-03-10 ENCOUNTER — Other Ambulatory Visit: Payer: Self-pay | Admitting: *Deleted

## 2019-03-10 ENCOUNTER — Ambulatory Visit
Admission: RE | Admit: 2019-03-10 | Discharge: 2019-03-10 | Disposition: A | Discharge status: Home / Self Care | Payer: Medicare Other | Source: Ambulatory Visit | Attending: Internal Medicine | Admitting: Internal Medicine

## 2019-03-10 ENCOUNTER — Other Ambulatory Visit: Payer: Self-pay

## 2019-03-10 DIAGNOSIS — C3412 Malignant neoplasm of upper lobe, left bronchus or lung: Secondary | ICD-10-CM | POA: Diagnosis present

## 2019-03-10 DIAGNOSIS — C50812 Malignant neoplasm of overlapping sites of left female breast: Secondary | ICD-10-CM

## 2019-03-10 LAB — POCT I-STAT CREATININE: Creatinine, Ser: 0.8 mg/dL (ref 0.44–1.00)

## 2019-03-10 MED ORDER — IOHEXOL 300 MG/ML  SOLN
75.0000 mL | Freq: Once | INTRAMUSCULAR | Status: AC | PRN
  Administered 2019-03-10: 75 mL via INTRAVENOUS

## 2019-03-11 NOTE — Progress Notes (Signed)
Patient is coming in for CT scan results she is doing well no complaints

## 2019-03-12 ENCOUNTER — Encounter: Payer: Self-pay | Admitting: Internal Medicine

## 2019-03-12 ENCOUNTER — Inpatient Hospital Stay: Payer: Medicare Other | Attending: Internal Medicine | Admitting: Internal Medicine

## 2019-03-12 ENCOUNTER — Other Ambulatory Visit: Payer: Self-pay

## 2019-03-12 DIAGNOSIS — C3412 Malignant neoplasm of upper lobe, left bronchus or lung: Secondary | ICD-10-CM

## 2019-03-12 DIAGNOSIS — I2510 Atherosclerotic heart disease of native coronary artery without angina pectoris: Secondary | ICD-10-CM | POA: Insufficient documentation

## 2019-03-12 DIAGNOSIS — Z85118 Personal history of other malignant neoplasm of bronchus and lung: Secondary | ICD-10-CM | POA: Diagnosis present

## 2019-03-12 DIAGNOSIS — M810 Age-related osteoporosis without current pathological fracture: Secondary | ICD-10-CM | POA: Insufficient documentation

## 2019-03-12 DIAGNOSIS — Z853 Personal history of malignant neoplasm of breast: Secondary | ICD-10-CM | POA: Diagnosis not present

## 2019-03-12 MED ORDER — GABAPENTIN 100 MG PO CAPS: ORAL_CAPSULE | ORAL | 6 refills | Status: DC

## 2019-03-12 NOTE — Assessment & Plan Note (Addendum)
#  Left upper lobe adenocarcinoma stage I; NOV 2020- CT chest NED; STABLE.  Continue surveillance CT scan in 6 months.  # Bil Lower Lung nodules- likely benign-CT NOV 2020-STABLE;  continue surveillance.   #Post thoracotomy pain-stable; currently on tramadol; continue neurontin 100 mg in AM; 200 mg in PM; STABLE.   # History of breast cancer stage I- STABLE.   # BMD- April 2019- Osteoporosis- ca+vit D BID; On reclast every 12 months/PCP; STABLE.   # Coronary atherosclerosis- stopped statin [made feel weird]; recommend follow up with Dr.Gollan/PCP.   # DISPOSITION: # Follow-up in 6 months-MD/CT chest prior- Dr.B

## 2019-03-12 NOTE — Progress Notes (Signed)
[facility] OFFICE PROGRESS NOTE  Patient Care Team: [name], MD as PCP - General (Infectious Diseases)  Cancer Staging No matching staging information was found for the patient.   Oncology History Overview Note  # 2012- LEFT BREAST [pt1b- STAGE I; G-2];ER/PR-Pos; her 2 Neu-NEG;  RS- 21 [risk of recurrence 13%];s/p Lumpec& SLNBx; NO chemo; s/p RT; On Aromasin  [finish April 2017]  # MAY 2019- ADENO CA LUL STAGE IA [s/p wedge resection; Dr.Oaks; [facility]]  # Osteoporosis- stopped PO bisphosphonate sec to GI issues; On reclast every 12 months; defer to PCP.  Chronic back pain-Dr.Chasnis   DIAGNOSIS: _0  Breast ca Stage I ER/PR pos; Her 2NEG; LUL adeno stage I  GOALS: curative  CURRENT/MOST RECENT THERAPY- surveillance    Carcinoma of overlapping sites of left breast in female, estrogen receptor positive ([place])  Primary cancer of left upper lobe of lung ([place])      INTERVAL HISTORY:  Katie Franklin 63 y.o.  female pleasant patient above history of breast cancer; and also left upper lobe lung cancer is here for a follow-up/review those of the CAT scan.  Patient continues to complain of left chest wall pain at the site of previous thoracic surgery.  She continues to be Neurontin; however feels it makes her drowsy during the day.  Denies any new lumps or bumps.  Appetite is good.  No new shortness of breath or cough.  She continues to use any here as needed.   Review of Systems  Constitutional: Negative for chills, diaphoresis, fever, malaise/fatigue and weight loss.  HENT: Negative for nosebleeds and sore throat.   Eyes: Negative for double vision.  Respiratory: Negative for cough, hemoptysis, sputum production, shortness of breath and wheezing.   Cardiovascular: Positive for chest pain. Negative for palpitations, orthopnea and leg swelling.  Gastrointestinal: Negative for abdominal pain, blood in stool, constipation, diarrhea, heartburn, melena, nausea and  vomiting.  Genitourinary: Negative for dysuria, frequency and urgency.  Musculoskeletal: Negative for back pain and joint pain.  Skin: Negative.  Negative for itching and rash.  Neurological: Negative for dizziness, tingling, focal weakness, weakness and headaches.  Endo/Heme/Allergies: Does not bruise/bleed easily.  Psychiatric/Behavioral: Negative for depression. The patient is not nervous/anxious and does not have insomnia.       PAST MEDICAL HISTORY :  Past Medical History:  Diagnosis Date  . Breast cancer ([place]) 2012   left breast  . COPD (chronic obstructive pulmonary disease) ([place])   . Deafness in left ear   . Depression   . Diverticulosis   . 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 Family history of adverse reaction to anesthesia    mother got sick  . Gastritis   . GERD (gastroesophageal reflux disease)   . H. pylori infection   . H/O herpes labialis   . Hemorrhoids   . History of chicken pox   . Hyperplastic colon polyp   . Hypertension   . Lung cancer ([place]) 08/2017   surgery only, no rad or chemo tx  . Multinodular goiter (nontoxic)   . Osteoporosis   . Panic disorder   . Personal history of radiation therapy   . Recurrent sinusitis   . Recurrent sinusitis   . Tobacco use   . Tubular adenoma of colon     PAST SURGICAL HISTORY :   Past Surgical History:  Procedure Laterality Date  . BACK SURGERY  2018   lumbar  . BREAST EXCISIONAL BIOPSY Left  negative 1980's twice  . BREAST EXCISIONAL BIOPSY Left 2012   [facility]  . BREAST LUMPECTOMY Left 2012   [facility], clear margins, LN neg  . BUNIONECTOMY Right   . CHOLECYSTECTOMY N/A 06/11/2017   Procedure: LAPAROSCOPIC CHOLECYSTECTOMY;  Surgeon: Herbert Pun, MD;  Location: [facility] ORS;  Service: General;  Laterality: N/A;  . COLONOSCOPY WITH PROPOFOL N/A 12/28/2014   Procedure: COLONOSCOPY WITH PROPOFOL;  Surgeon: Lollie Sails, MD;  Location: [facility] ENDOSCOPY;  Service: Endoscopy;  Laterality: N/A;  .  COLONOSCOPY WITH PROPOFOL N/A 08/17/2017   Procedure: COLONOSCOPY WITH PROPOFOL;  Surgeon: Lollie Sails, MD;  Location: [facility] ENDOSCOPY;  Service: Endoscopy;  Laterality: N/A;  . cyst throat     base of tongue  . ESOPHAGOGASTRODUODENOSCOPY    . ESOPHAGOGASTRODUODENOSCOPY (EGD) WITH PROPOFOL N/A 12/02/2015   Procedure: ESOPHAGOGASTRODUODENOSCOPY (EGD) WITH PROPOFOL;  Surgeon: Lollie Sails, MD;  Location: [facility] ENDOSCOPY;  Service: Endoscopy;  Laterality: N/A;  . ESOPHAGOGASTRODUODENOSCOPY (EGD) WITH PROPOFOL N/A 04/13/2017   Procedure: ESOPHAGOGASTRODUODENOSCOPY (EGD) WITH PROPOFOL;  Surgeon: Lollie Sails, MD;  Location: [facility] ENDOSCOPY;  Service: Endoscopy;  Laterality: N/A;  . FLEXIBLE BRONCHOSCOPY N/A 08/27/2017   Procedure: PREOP BRONCHOSCOPY;  Surgeon: Nestor Lewandowsky, MD;  Location: [facility] ORS;  Service: Thoracic;  Laterality: N/A;  . GANGLION CYST EXCISION Right    wrist  . Incision tendon sheath for trigger finger Left   . LUMBAR LAMINECTOMY/DECOMPRESSION MICRODISCECTOMY Right 11/06/2016   Procedure: RIGHT L5/S1 HEMILAMINECTOMY AND CYST RESECTION L5/S1;  Surgeon: Deetta Perla, MD;  Location: [facility] ORS;  Service: Neurosurgery;  Laterality: Right;  . NASAL SEPTUM SURGERY    . THORACOTOMY Left 08/27/2017   Procedure: THORACOTOMY MAJOR;  Surgeon: Nestor Lewandowsky, MD;  Location: [facility] ORS;  Service: Thoracic;  Laterality: Left;  . TONSILLECTOMY    . TRIGGER FINGER RELEASE Left     FAMILY HISTORY :   Family History  Problem Relation Age of Onset  . Breast cancer Maternal Aunt 90       great aunt  . Prostate cancer Father   . Hypertension Father   . Heart disease Father   . Alzheimer's disease Mother   . Heart disease Mother   . Hypertension Mother   . Colon polyps Mother   . Depression Brother        brother #2    SOCIAL HISTORY:   Social History   Tobacco Use  . Smoking status: Former Smoker    Packs/day: 1.00    Years: 45.00    Pack years: 45.00    Types: Cigarettes     Quit date: 07/26/2017    Years since quitting: 1.6  . Smokeless tobacco: Never Used  Substance Use Topics  . Alcohol use: No    Alcohol/week: 0.0 standard drinks  . Drug use: No    ALLERGIES:  is allergic to erythromycin; septra ds [sulfamethoxazole-trimethoprim]; sulfa antibiotics; tetracyclines & related; demeclocycline; and latex.  MEDICATIONS:  Current Outpatient Medications  Medication Sig Dispense Refill  . acetaminophen (TYLENOL) 500 MG tablet Take 500-1,000 mg by mouth 2 (two) times daily as needed for moderate pain (FOR HEADACHES/BACK PAIN.).     Marland Kitchen albuterol (PROVENTIL HFA;VENTOLIN HFA) 108 (90 BASE) MCG/ACT inhaler Inhale 2 puffs into the lungs every 6 (six) hours as needed for wheezing or shortness of breath.     . ALPRAZolam (XANAX) 0.25 MG tablet Take 0.25 mg by mouth 3 (three) times daily as needed for anxiety.     Marland Kitchen atenolol (TENORMIN) 50 MG tablet  Take 50 mg by mouth daily.    . Calcium Carbonate-Vitamin D (CALTRATE 600+D PO) Take 1 tablet by mouth daily. Chewable    . gabapentin (NEURONTIN) 100 MG capsule Take 1 pill in am ; and 2 pills in PM. 90 capsule 6  . Lifitegrast (XIIDRA) 5 % SOLN Place 1 drop into both eyes 2 (two) times daily as needed (for excessively dry eyes.).    Marland Kitchen pantoprazole (PROTONIX) 40 MG tablet Take 40 mg by mouth daily.    Marland Kitchen Propylene Glycol-Glycerin (SOOTHE) 0.6-0.6 % SOLN Place 1-2 drops into both eyes 5 (five) times daily as needed (for dry eyes.). SOOTHE XP    . spironolactone (ALDACTONE) 50 MG tablet Take 25 mg by mouth daily.     . vitamin B-12 (CYANOCOBALAMIN) 1000 MCG tablet Take 1,000 mcg by mouth daily.    . vitamin E 1000 UNIT capsule Take 1,000 Units by mouth daily.    Marland Kitchen zolpidem (AMBIEN) 10 MG tablet Take 10 mg by mouth at bedtime as needed for sleep.    Marland Kitchen eletriptan (RELPAX) 40 MG tablet Take 40 mg by mouth as needed for migraine or headache. May repeat in 2 hours if headache persists or recurs.     No current facility-administered  medications for this visit.     PHYSICAL EXAMINATION: ECOG PERFORMANCE STATUS: 1 - Symptomatic but completely ambulatory  BP 131/80 (BP Location: Right Arm, Patient Position: Sitting, Cuff Size: Normal)   Pulse (!) 55   Temp (!) 96.3 F (35.7 C) (Tympanic)   Wt 119 lb (54 kg)   BMI 19.80 kg/m   Filed Weights   03/11/19 1028  Weight: 119 lb (54 kg)    Physical Exam  Constitutional: She is oriented to person, place, and time and well-developed, well-nourished, and in no distress.  HENT:  Head: Normocephalic and atraumatic.  Mouth/Throat: Oropharynx is clear and moist. No oropharyngeal exudate.  Eyes: Pupils are equal, round, and reactive to light.  Neck: Normal range of motion. Neck supple.  Cardiovascular: Normal rate and regular rhythm.  Pulmonary/Chest: No respiratory distress. She has no wheezes.  Abdominal: Soft. Bowel sounds are normal. She exhibits no distension and no mass. There is no abdominal tenderness. There is no rebound and no guarding.  Musculoskeletal: Normal range of motion.        General: No tenderness or edema.  Neurological: She is alert and oriented to person, place, and time.  Skin: Skin is warm.  Right and left BREAST exam (in the presence of nurse)- no unusual skin changes or dominant masses felt. Surgical scars noted.    Psychiatric: Affect normal.       LABORATORY DATA:  I have reviewed the data as listed    Component Value Date/Time   NA 137 03/06/2018 1302   NA 136 02/06/2012 0957   K 4.1 03/06/2018 1302   K 3.8 02/06/2012 0957   CL 102 03/06/2018 1302   CO2 27 03/06/2018 1302   GLUCOSE 95 03/06/2018 1302   BUN 8 03/06/2018 1302   CREATININE 0.80 03/10/2019 0901   CREATININE 0.97 11/12/2013 1508   CALCIUM 9.0 03/06/2018 1302   CALCIUM 9.5 05/08/2012 1018   PROT 7.4 03/06/2018 1302   PROT 7.6 11/12/2013 1508   ALBUMIN 3.9 03/06/2018 1302   ALBUMIN 3.7 11/12/2013 1508   AST 18 03/06/2018 1302   AST 10 (L) 11/12/2013 1508   ALT  14 03/06/2018 1302   ALT 15 11/12/2013 1508   ALKPHOS 43 03/06/2018 1302  ALKPHOS 49 11/12/2013 1508   BILITOT 1.3 (H) 03/06/2018 1302   BILITOT 0.7 11/12/2013 1508   GFRNONAA >60 03/06/2018 1302   GFRNONAA >60 11/12/2013 1508   GFRAA >60 03/06/2018 1302   GFRAA >60 11/12/2013 1508    No results found for: SPEP, UPEP  Lab Results  Component Value Date   WBC 7.6 03/06/2018   NEUTROABS 5.1 03/06/2018   HGB 13.1 03/06/2018   HCT 39.4 03/06/2018   MCV 96.1 03/06/2018   PLT 204 03/06/2018      Chemistry      Component Value Date/Time   NA 137 03/06/2018 1302   NA 136 02/06/2012 0957   K 4.1 03/06/2018 1302   K 3.8 02/06/2012 0957   CL 102 03/06/2018 1302   CO2 27 03/06/2018 1302   BUN 8 03/06/2018 1302   CREATININE 0.80 03/10/2019 0901   CREATININE 0.97 11/12/2013 1508      Component Value Date/Time   CALCIUM 9.0 03/06/2018 1302   CALCIUM 9.5 05/08/2012 1018   ALKPHOS 43 03/06/2018 1302   ALKPHOS 49 11/12/2013 1508   AST 18 03/06/2018 1302   AST 10 (L) 11/12/2013 1508   ALT 14 03/06/2018 1302   ALT 15 11/12/2013 1508   BILITOT 1.3 (H) 03/06/2018 1302   BILITOT 0.7 11/12/2013 1508       RADIOGRAPHIC STUDIES: I have personally reviewed the radiological images as listed and agreed with the findings in the report. No results found.   ASSESSMENT & PLAN:  Primary cancer of left upper lobe of lung ([place]) #Left upper lobe adenocarcinoma stage I; NOV 2020- CT chest NED; STABLE.  Continue surveillance CT scan in 6 months.  # Bil Lower Lung nodules- likely benign-CT NOV 2020-STABLE;  continue surveillance.   #Post thoracotomy pain-stable; currently on tramadol; continue neurontin 100 mg in AM; 200 mg in PM; STABLE.   # History of breast cancer stage I- STABLE.   # BMD- April 2019- Osteoporosis- ca+vit D BID; On reclast every 12 months/PCP; STABLE.   # Coronary atherosclerosis- stopped statin [made feel weird]; recommend follow up with Dr.Gollan/PCP.   #  DISPOSITION: # Follow-up in 6 months-MD/CT chest prior- Dr.B    Orders Placed This Encounter  Procedures  . CT Chest W Contrast    Standing Status:   Future    Standing Expiration Date:   03/11/2020    Order Specific Question:   If indicated for the ordered procedure, I authorize the administration of contrast media per Radiology protocol    Answer:   Yes    Order Specific Question:   Preferred [facility] location?    Answer[facility]    Order Specific Question:   Radiology Contrast Protocol - do NOT remove file path    Answer:   [email or web address]    Order Specific Question:   ** REASON FOR EXAM (FREE TEXT)    Answer:   lung cancer   All questions were answered. The patient knows to call the clinic with any problems, questions or concerns.      Cammie Sickle, MD 03/12/2019 12:24 PM

## 2019-03-19 ENCOUNTER — Ambulatory Visit: Payer: Medicare Other | Admitting: Internal Medicine

## 2019-03-21 ENCOUNTER — Ambulatory Visit
Admission: RE | Admit: 2019-03-21 | Discharge: 2019-03-21 | Disposition: A | Discharge status: Home / Self Care | Payer: Medicare Other | Source: Ambulatory Visit | Attending: Internal Medicine | Admitting: Internal Medicine

## 2019-03-21 DIAGNOSIS — Z853 Personal history of malignant neoplasm of breast: Secondary | ICD-10-CM | POA: Insufficient documentation

## 2019-03-21 DIAGNOSIS — Z1231 Encounter for screening mammogram for malignant neoplasm of breast: Secondary | ICD-10-CM | POA: Diagnosis not present

## 2019-03-21 DIAGNOSIS — C50812 Malignant neoplasm of overlapping sites of left female breast: Secondary | ICD-10-CM

## 2019-03-21 DIAGNOSIS — Z170 Estrogen receptor positive status [ER+]: Secondary | ICD-10-CM

## 2019-03-31 ENCOUNTER — Other Ambulatory Visit: Payer: Self-pay

## 2019-03-31 ENCOUNTER — Encounter: Payer: Self-pay | Admitting: Cardiovascular Disease

## 2019-03-31 ENCOUNTER — Ambulatory Visit (INDEPENDENT_AMBULATORY_CARE_PROVIDER_SITE_OTHER): Payer: Medicare Other | Admitting: Cardiovascular Disease

## 2019-03-31 VITALS — BP 110/70 | HR 65 | Ht 65.0 in | Wt 119.2 lb

## 2019-03-31 DIAGNOSIS — R002 Palpitations: Secondary | ICD-10-CM | POA: Diagnosis not present

## 2019-03-31 MED ORDER — EZETIMIBE 10 MG PO TABS: 10.0000 mg | ORAL_TABLET | Freq: Every day | ORAL | 3 refills | Status: DC

## 2019-03-31 NOTE — Patient Instructions (Addendum)
Medication Instructions:  Please start zetia 10 mg daily for cholesterol  Take  extra 1/2 atenolol for fast heart rate  If you need a refill on your cardiac medications before your next appointment, please call your pharmacy.    Lab work: No new labs needed   If you have labs (blood work) drawn today and your tests are completely normal, you will receive your results only by: Marland Kitchen MyChart Message (if you have MyChart) OR . A paper copy in the mail If you have any lab test that is abnormal or we need to change your treatment, we will call you to review the results.   Testing/Procedures: No new testing needed   Follow-Up: At [facility], you and your health needs are our priority.  As part of our continuing mission to provide you with exceptional heart care, we have created designated Provider Care Teams.  These Care Teams include your primary Cardiologist (physician) and Advanced Practice Providers (APPs -  Physician Assistants and Nurse Practitioners) who all work together to provide you with the care you need, when you need it.  . You will need a follow up appointment in 12 months .  Marland Kitchen Providers on your designated Care Team:   . Murray Hodgkins, NP . Christell Faith, PA-C . Marrianne Mood, PA-C  Any Other Special Instructions Will Be Listed Below (If Applicable).  For educational health videos Log in to : [email or web address] Or : [email or web address], password : [email or web address]

## 2019-03-31 NOTE — Progress Notes (Signed)
Cardiology Office Note  Date:  03/31/2019   ID:  Katie Franklin, DOB [birth year], MRN [id number]  PCP:  [name], MD   Chief Complaint  Patient presents with  . office visit    12 mo F/U-Patient reports intermittent palpitations and cramping inher calves; Meds verbally reviewed with patient.    HPI:  Katie Franklin is a 63 y.o. female.   Active smoker COPD: Moderate centrilobular and mild paraseptal emphysema on CT scan left-sided breast cancer treated with lumpectomy and radiation therapy.   Laparoscopic Cholecystectomy.  Who presents for follow-up of CAD on CT scan/Pet scan  In follow-up today reports that she feels relatively well Still smoking a little bit, less than before  Recent CT chest, images pulled up and reviewed with her Significant coronary calcification noted, aortic atherosclerosis  Denies any anginal symptoms Rash on crestor Not on any medications for cholesterol  Rare epsiodes of tachcardia Lasting only a few seconds.  Sits down and go away  Deaf in left ear On spironolactone for hearing Taking 1/2 pill  Reports having some cramps in legs Cheek swelling, 3 weeks New medication, prolia  total chol 215 Numbers reviewed with her  EKG personally reviewed by myself on todays visit Shows NSr rate 65 bpm, no ST or T wave changes  Other past medical history reviewed pulmonary function studies  FEV1 of approximately 70% and a DLCO of approximately 80%.     smoking since she was 63 years of age   smoked upwards of 2 packs a day for some years of her life   PMH:   has a past medical history of Breast cancer ([place]) (2012), COPD (chronic obstructive pulmonary disease) ([place]), Deafness in left ear, Depression, Diverticulosis, Ductal carcinoma of left breast ([place]) (2012), Family history of adverse reaction to anesthesia, Gastritis, GERD (gastroesophageal reflux disease), H. pylori infection, H/O herpes labialis, Hemorrhoids, History of chicken pox,  Hyperplastic colon polyp, Hypertension, Lung cancer ([place]) (08/2017), Multinodular goiter (nontoxic), Osteoporosis, Panic disorder, Personal history of radiation therapy, Recurrent sinusitis, Recurrent sinusitis, Tobacco use, and Tubular adenoma of colon.  PSH:    Past Surgical History:  Procedure Laterality Date  . BACK SURGERY  2018   lumbar  . BREAST EXCISIONAL BIOPSY Left    negative 1980's twice  . BREAST EXCISIONAL BIOPSY Left 2012   [facility]  . BREAST LUMPECTOMY Left 2012   [facility], clear margins, LN neg  . BUNIONECTOMY Right   . CHOLECYSTECTOMY N/A 06/11/2017   Procedure: LAPAROSCOPIC CHOLECYSTECTOMY;  Surgeon: Herbert Pun, MD;  Location: [facility] ORS;  Service: General;  Laterality: N/A;  . COLONOSCOPY WITH PROPOFOL N/A 12/28/2014   Procedure: COLONOSCOPY WITH PROPOFOL;  Surgeon: Lollie Sails, MD;  Location: [facility] ENDOSCOPY;  Service: Endoscopy;  Laterality: N/A;  . COLONOSCOPY WITH PROPOFOL N/A 08/17/2017   Procedure: COLONOSCOPY WITH PROPOFOL;  Surgeon: Lollie Sails, MD;  Location: [facility] ENDOSCOPY;  Service: Endoscopy;  Laterality: N/A;  . cyst throat     base of tongue  . ESOPHAGOGASTRODUODENOSCOPY    . ESOPHAGOGASTRODUODENOSCOPY (EGD) WITH PROPOFOL N/A 12/02/2015   Procedure: ESOPHAGOGASTRODUODENOSCOPY (EGD) WITH PROPOFOL;  Surgeon: Lollie Sails, MD;  Location[facility] ENDOSCOPY;  Service: Endoscopy;  Laterality: N/A;  . ESOPHAGOGASTRODUODENOSCOPY (EGD) WITH PROPOFOL N/A 04/13/2017   Procedure: ESOPHAGOGASTRODUODENOSCOPY (EGD) WITH PROPOFOL;  Surgeon: Lollie Sails, MD;  Location: [facility] ENDOSCOPY;  Service: Endoscopy;  Laterality: N/A;  . FLEXIBLE BRONCHOSCOPY N/A 08/27/2017   Procedure: PREOP BRONCHOSCOPY;  Surgeon: Nestor Lewandowsky, MD;  Location:  [facility] ORS;  Service: Thoracic;  Laterality: N/A;  . GANGLION CYST EXCISION Right    wrist  . Incision tendon sheath for trigger finger Left   . LUMBAR LAMINECTOMY/DECOMPRESSION MICRODISCECTOMY Right 11/06/2016   Procedure:  RIGHT L5/S1 HEMILAMINECTOMY AND CYST RESECTION L5/S1;  Surgeon: Deetta Perla, MD;  Location: [facility] ORS;  Service: Neurosurgery;  Laterality: Right;  . NASAL SEPTUM SURGERY    . THORACOTOMY Left 08/27/2017   Procedure: THORACOTOMY MAJOR;  Surgeon: Nestor Lewandowsky, MD;  Location: [facility] ORS;  Service: Thoracic;  Laterality: Left;  . TONSILLECTOMY    . TRIGGER FINGER RELEASE Left     Current Outpatient Medications  Medication Sig Dispense Refill  . acetaminophen (TYLENOL) 500 MG tablet Take 500-1,000 mg by mouth 2 (two) times daily as needed for moderate pain (FOR HEADACHES/BACK PAIN.).     Marland Kitchen albuterol (PROVENTIL HFA;VENTOLIN HFA) 108 (90 BASE) MCG/ACT inhaler Inhale 2 puffs into the lungs every 6 (six) hours as needed for wheezing or shortness of breath.     . ALPRAZolam (XANAX) 0.25 MG tablet Take 0.25 mg by mouth 3 (three) times daily as needed for anxiety.     Marland Kitchen atenolol (TENORMIN) 50 MG tablet Take 50 mg by mouth daily.    . Calcium Carbonate-Vitamin D (CALTRATE 600+D PO) Take 1 tablet by mouth daily. Chewable    . denosumab (PROLIA) 60 MG/ML SOSY injection Inject 60 mg into the skin every 6 (six) months.     . eletriptan (RELPAX) 40 MG tablet Take 40 mg by mouth as needed for migraine or headache. May repeat in 2 hours if headache persists or recurs.    . gabapentin (NEURONTIN) 100 MG capsule Take 1 pill in am ; and 2 pills in PM. 90 capsule 6  . Lifitegrast (XIIDRA) 5 % SOLN Place 1 drop into both eyes 2 (two) times daily as needed (for excessively dry eyes.).    Marland Kitchen pantoprazole (PROTONIX) 40 MG tablet Take 40 mg by mouth daily.    Marland Kitchen Propylene Glycol-Glycerin (SOOTHE) 0.6-0.6 % SOLN Place 1-2 drops into both eyes 5 (five) times daily as needed (for dry eyes.). SOOTHE XP    . spironolactone (ALDACTONE) 50 MG tablet Take 25 mg by mouth daily.     . vitamin B-12 (CYANOCOBALAMIN) 1000 MCG tablet Take 1,000 mcg by mouth daily.    . vitamin E 1000 UNIT capsule Take 1,000 Units by mouth daily.    Marland Kitchen  zolpidem (AMBIEN) 10 MG tablet Take 10 mg by mouth at bedtime as needed for sleep.     No current facility-administered medications for this visit.     Allergies:   Erythromycin, Septra ds [sulfamethoxazole-trimethoprim], Sulfa antibiotics, Tetracyclines & related, Demeclocycline, and Latex   Social History:  The patient  reports that she quit smoking about 20 months ago. Her smoking use included cigarettes. She has a 45.00 pack-year smoking history. She has never used smokeless tobacco. She reports that she does not drink alcohol or use drugs.   Family History:   family history includes Alzheimer's disease in her mother; Breast cancer (age of onset: 11) in her maternal aunt; Colon polyps in her mother; Depression in her brother; Heart disease in her father and mother; Hypertension in her father and mother; Prostate cancer in her father.    Review of Systems: Review of Systems  Constitutional: Negative.   HENT: Negative.   Respiratory: Negative.   Cardiovascular: Negative.   Gastrointestinal: Negative.   Neurological: Negative.   Psychiatric/Behavioral: Negative.  All other systems reviewed and are negative.    PHYSICAL EXAM: VS:  BP 110/70 (BP Location: Left Arm, Patient Position: Sitting, Cuff Size: Normal)   Pulse 65   Ht _0  (1.651 m)   Wt 119 lb 4 oz (54.1 kg)   SpO2 98%   BMI 19.84 kg/m  , BMI Body mass index is 19.84 kg/m. Constitutional:  oriented to person, place, and time. No distress.  HENT:  Head: Grossly normal Eyes:  no discharge. No scleral icterus.  Neck: No JVD, no carotid bruits  Cardiovascular: Regular rate and rhythm, no murmurs appreciated Pulmonary/Chest: Clear to auscultation bilaterally, no wheezes or rails Abdominal: Soft.  no distension.  no tenderness.  Musculoskeletal: Normal range of motion Neurological:  normal muscle tone. Coordination normal. No atrophy Skin: Skin warm and dry Psychiatric: normal affect, pleasant   Recent  Labs: 03/10/2019: Creatinine, Ser 0.80    Lipid Panel Lab Results  Component Value Date   CHOL 201 (H) 10/29/2017   HDL 54 10/29/2017   LDLCALC 122 (H) 10/29/2017   TRIG 123 10/29/2017      Wt Readings from Last 3 Encounters:  03/31/19 119 lb 4 oz (54.1 kg)  03/11/19 119 lb (54 kg)  06/11/18 114 lb 9.6 oz (52 kg)      ASSESSMENT AND PLAN:  Aortic atherosclerosis ([facility]) disease on CT scan,  Stressed importance of smoking cessation Recommend she try Zetia 10 mg daily to achieve goal LDL less than 70 Unable to tolerate Crestor Now with worsening leg cramps and she is not on a statin.  We will old off on starting Lipitor  Centrilobular emphysema ([place]) Strong recommendation to quit smoking Chronic mild shortness of breath on exertion History of lobectomy  Coronary artery disease involving native coronary artery of native heart without angina pectoris -  CT scan images reviewed with her, stressed importance of aggressive cholesterol control, smoking cessation  Smoker She does not want Chantix Discussed other modalities for smoking cessation  Essential hypertension - Plan: EKG 12-Lead Blood pressure is well controlled on today's visit. No changes made to the medications.  History of breast cancer Prior radiation on the left  Stressed importance of aggressive lipid control, smoking cessation  Disposition:   F/U 12 months   Total encounter time more than 25 minutes  Greater than 50% was spent in counseling and coordination of care with the patient    Orders Placed This Encounter  Procedures  . EKG 12-Lead     Signed, Esmond Plants, M.D., Ph.D. 03/31/2019  [place], [place]

## 2019-04-07 ENCOUNTER — Ambulatory Visit: Payer: Medicare Other | Admitting: Cardiovascular Disease

## 2019-04-14 ENCOUNTER — Telehealth: Payer: Self-pay | Admitting: Cardiovascular Disease

## 2019-04-14 NOTE — Telephone Encounter (Signed)
Pt c/o medication issue:  1. Name of Medication: Zetia    2. How are you currently taking this medication (dosage and times per day)? 10 MG 1 tablet daily   3. Are you having a reaction (difficulty breathing--STAT)? Rash on hands and arms   4. What is your medication issue? Patient came by office, after starting Zetia medication patient started getting rash on hands and up arms.  Patient has stopped medication and would like to know what to do next.  Please call to discuss.

## 2019-04-15 NOTE — Telephone Encounter (Signed)
Spoke with patient and she states that she has rash on her hands and had itching. It went down her arm as well. She started putting antibacterial cream on hands and arms. She reports it is getting better. Reviewed provider recommendations to start atorvastatin and she declined stating that she did not want to try something new at this time. Reviewed what medication was for and instructed her to please call back if and when she would like to start on this. She verbalized understanding of our conversation, agreement with plan, and had no further questions at this time.

## 2019-04-15 NOTE — Telephone Encounter (Signed)
No substitute for zetia,  She also reported rash on crestor We could try atorvastatin 10 mg daily

## 2019-08-27 HISTORY — PX: EYE SURGERY: SHX253

## 2019-09-09 ENCOUNTER — Ambulatory Visit
Admission: RE | Admit: 2019-09-09 | Discharge: 2019-09-09 | Disposition: A | Discharge status: Home / Self Care | Payer: Medicare Other | Source: Ambulatory Visit | Attending: Internal Medicine | Admitting: Internal Medicine

## 2019-09-09 ENCOUNTER — Other Ambulatory Visit: Payer: Self-pay

## 2019-09-09 DIAGNOSIS — C3412 Malignant neoplasm of upper lobe, left bronchus or lung: Secondary | ICD-10-CM

## 2019-09-09 DIAGNOSIS — R221 Localized swelling, mass and lump, neck: Secondary | ICD-10-CM | POA: Insufficient documentation

## 2019-09-09 DIAGNOSIS — Z5321 Procedure and treatment not carried out due to patient leaving prior to being seen by health care provider: Secondary | ICD-10-CM | POA: Insufficient documentation

## 2019-09-09 LAB — POCT I-STAT CREATININE: Creatinine, Ser: 0.8 mg/dL (ref 0.44–1.00)

## 2019-09-09 MED ORDER — IOHEXOL 300 MG/ML  SOLN
75.0000 mL | Freq: Once | INTRAMUSCULAR | Status: AC | PRN
  Administered 2019-09-09: 75 mL via INTRAVENOUS

## 2019-09-10 ENCOUNTER — Encounter: Payer: Self-pay | Admitting: *Deleted

## 2019-09-10 ENCOUNTER — Ambulatory Visit: Payer: Medicare Other | Admitting: Internal Medicine

## 2019-09-10 ENCOUNTER — Emergency Department
Admission: EM | Admit: 2019-09-10 | Discharge: 2019-09-10 | Disposition: A | Discharge status: Home / Self Care | Payer: Medicare Other | Attending: Emergency Medicine | Admitting: Emergency Medicine

## 2019-09-10 HISTORY — PX: EYE SURGERY: SHX253

## 2019-09-10 NOTE — ED Notes (Signed)
Per dr brown, no test to order at this time.

## 2019-09-10 NOTE — ED Triage Notes (Signed)
Pt reports after cookies tonight, pt had swelling to left side of neck.  No resp distress.  No swelling now.  No sore throat.  No tongue swelling.  Pt alert  Speech clear.

## 2019-09-16 ENCOUNTER — Encounter: Payer: Self-pay | Admitting: Internal Medicine

## 2019-09-16 ENCOUNTER — Other Ambulatory Visit: Payer: Self-pay

## 2019-09-16 ENCOUNTER — Inpatient Hospital Stay: Payer: Medicare Other | Attending: Internal Medicine | Admitting: Internal Medicine

## 2019-09-16 DIAGNOSIS — Z853 Personal history of malignant neoplasm of breast: Secondary | ICD-10-CM | POA: Diagnosis not present

## 2019-09-16 DIAGNOSIS — Z85118 Personal history of other malignant neoplasm of bronchus and lung: Secondary | ICD-10-CM | POA: Insufficient documentation

## 2019-09-16 DIAGNOSIS — M810 Age-related osteoporosis without current pathological fracture: Secondary | ICD-10-CM | POA: Insufficient documentation

## 2019-09-16 DIAGNOSIS — C3412 Malignant neoplasm of upper lobe, left bronchus or lung: Secondary | ICD-10-CM | POA: Diagnosis not present

## 2019-09-16 NOTE — Assessment & Plan Note (Addendum)
#  Left upper lobe adenocarcinoma stage I; June 2021- CT chest NED; STABLE.  Continue surveillance CT scan  Annual.   # Bil Lower Lung nodules- likely benign-May 2021-STABLE. continue surveillance.   # Post thoracotomy pain-worse; currently on tramadol; increased neurontin 200 mg in AM; 200 mg in PM;   # History of breast cancer stage I-stable  # BMD- April 2019- Osteoporosis- ca+vit D BID; On reclast every 12 months/PCP;-stable  # Transient left side neck swelling-currently resolved.  Unclear etiology.  Monitor for now.  If reoccurs-patient will inform us.  # DISPOSITION: # Follow-up in 6 months-MD; labs- cbc/cmp- Dr.B   # I reviewed the blood work- with the patient in detail; also reviewed the imaging independently [as summarized above]; and with the patient in detail.

## 2019-09-16 NOTE — Progress Notes (Signed)
[place] OFFICE PROGRESS NOTE  Patient Care Team: [name], MD as PCP - General (Infectious Diseases)  Cancer Staging No matching staging information was found for the patient.   Oncology History Overview Note  # 2012- LEFT BREAST [pt1b- STAGE I; G-2];ER/PR-Pos; her 2 Neu-NEG;  RS- 19 [risk of recurrence 13%];s/p Lumpec& SLNBx; NO chemo; s/p RT; On Aromasin  [finish April 2017]  # MAY 2019- ADENO CA LUL STAGE IA [s/p wedge resection; Dr.Oaks; [facility]]  # Osteoporosis- stopped PO bisphosphonate sec to GI issues; On reclast every 12 months; defer to PCP.  Chronic back pain-Dr.Chasnis   DIAGNOSIS: _0  Breast ca Stage I ER/PR pos; Her 2NEG; LUL adeno stage I  GOALS: curative  CURRENT/MOST RECENT THERAPY- surveillance    Carcinoma of overlapping sites of left breast in female, estrogen receptor positive ([place])  Primary cancer of left upper lobe of lung ([place])    INTERVAL HISTORY:  Katie Franklin 64 y.o.  female pleasant patient above history of breast cancer; and also left upper lobe lung cancer is here for a follow-up/review those of the CAT scan.  Patient noted to have a swelling in the left neck after eating cookies. However by the time she went to the emergency room, the neck swelling resolved.  She continues to complain of pain around the left chest wall at the site of previous thoracic surgery. She is taking gabapentin 2 pills a day.  Review of Systems  Constitutional: Negative for chills, diaphoresis, fever, malaise/fatigue and weight loss.  HENT: Negative for nosebleeds and sore throat.   Eyes: Negative for double vision.  Respiratory: Negative for cough, hemoptysis, sputum production, shortness of breath and wheezing.   Cardiovascular: Positive for chest pain. Negative for palpitations, orthopnea and leg swelling.  Gastrointestinal: Negative for abdominal pain, blood in stool, constipation, diarrhea, heartburn, melena, nausea and vomiting.   Genitourinary: Negative for dysuria, frequency and urgency.  Musculoskeletal: Negative for back pain and joint pain.  Skin: Negative.  Negative for itching and rash.  Neurological: Negative for dizziness, tingling, focal weakness, weakness and headaches.  Endo/Heme/Allergies: Does not bruise/bleed easily.  Psychiatric/Behavioral: Negative for depression. The patient is not nervous/anxious and does not have insomnia.       PAST MEDICAL HISTORY :  Past Medical History:  Diagnosis Date  . Breast cancer ([place]) 2012   left breast  . COPD (chronic obstructive pulmonary disease) ([place])   . Deafness in left ear   . Depression   . Diverticulosis   . 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 Family history of adverse reaction to anesthesia    mother got sick  . Gastritis   . GERD (gastroesophageal reflux disease)   . H. pylori infection   . H/O herpes labialis   . Hemorrhoids   . History of chicken pox   . Hyperplastic colon polyp   . Hypertension   . Lung cancer ([place]) 08/2017   surgery only, no rad or chemo tx  . Multinodular goiter (nontoxic)   . Osteoporosis   . Panic disorder   . Personal history of radiation therapy   . Recurrent sinusitis   . Recurrent sinusitis   . Tobacco use   . Tubular adenoma of colon     PAST SURGICAL HISTORY :   Past Surgical History:  Procedure Laterality Date  . BACK SURGERY  2018   lumbar  . BREAST EXCISIONAL BIOPSY Left    negative 1980's twice  . BREAST  EXCISIONAL BIOPSY Left 2012   [place]  . BREAST LUMPECTOMY Left 2012   [facility], clear margins, LN neg  . BUNIONECTOMY Right   . CHOLECYSTECTOMY N/A 06/11/2017   Procedure: LAPAROSCOPIC CHOLECYSTECTOMY;  Surgeon: Herbert Pun, MD;  Location: [facility] ORS;  Service: General;  Laterality: N/A;  . COLONOSCOPY WITH PROPOFOL N/A 12/28/2014   Procedure: COLONOSCOPY WITH PROPOFOL;  Surgeon: Lollie Sails, MD;  Location[facility];  Service: Endoscopy;  Laterality: N/A;  . COLONOSCOPY  WITH PROPOFOL N/A 08/17/2017   Procedure: COLONOSCOPY WITH PROPOFOL;  Surgeon: Lollie Sails, MD;  Location: [facility] ENDOSCOPY;  Service: Endoscopy;  Laterality: N/A;  . cyst throat     base of tongue  . ESOPHAGOGASTRODUODENOSCOPY    . ESOPHAGOGASTRODUODENOSCOPY (EGD) WITH PROPOFOL N/A 12/02/2015   Procedure: ESOPHAGOGASTRODUODENOSCOPY (EGD) WITH PROPOFOL;  Surgeon: Lollie Sails, MD;  Location: [facility] ENDOSCOPY;  Service: Endoscopy;  Laterality: N/A;  . ESOPHAGOGASTRODUODENOSCOPY (EGD) WITH PROPOFOL N/A 04/13/2017   Procedure: ESOPHAGOGASTRODUODENOSCOPY (EGD) WITH PROPOFOL;  Surgeon: Lollie Sails, MD;  Location: [facility] ENDOSCOPY;  Service: Endoscopy;  Laterality: N/A;  . FLEXIBLE BRONCHOSCOPY N/A 08/27/2017   Procedure: PREOP BRONCHOSCOPY;  Surgeon: Nestor Lewandowsky, MD;  Location: [facility] ORS;  Service: Thoracic;  Laterality: N/A;  . GANGLION CYST EXCISION Right    wrist  . Incision tendon sheath for trigger finger Left   . LUMBAR LAMINECTOMY/DECOMPRESSION MICRODISCECTOMY Right 11/06/2016   Procedure: RIGHT L5/S1 HEMILAMINECTOMY AND CYST RESECTION L5/S1;  Surgeon: Deetta Perla, MD;  Location: [facility] ORS;  Service: Neurosurgery;  Laterality: Right;  . NASAL SEPTUM SURGERY    . THORACOTOMY Left 08/27/2017   Procedure: THORACOTOMY MAJOR;  Surgeon: Nestor Lewandowsky, MD;  Location: [facility] ORS;  Service: Thoracic;  Laterality: Left;  . TONSILLECTOMY    . TRIGGER FINGER RELEASE Left     FAMILY HISTORY :   Family History  Problem Relation Age of Onset  . Breast cancer Maternal Aunt 90       great aunt  . Prostate cancer Father   . Hypertension Father   . Heart disease Father   . Alzheimer's disease Mother   . Heart disease Mother   . Hypertension Mother   . Colon polyps Mother   . Depression Brother        brother #2    SOCIAL HISTORY:   Social History   Tobacco Use  . Smoking status: Former Smoker    Packs/day: 1.00    Years: 45.00    Pack years: 45.00    Types: Cigarettes    Quit  date: 07/26/2017    Years since quitting: 2.1  . Smokeless tobacco: Never Used  Substance Use Topics  . Alcohol use: No    Alcohol/week: 0.0 standard drinks  . Drug use: No    ALLERGIES:  is allergic to erythromycin; septra ds [sulfamethoxazole-trimethoprim]; sulfa antibiotics; tetracyclines & related; demeclocycline; and latex.  MEDICATIONS:  Current Outpatient Medications  Medication Sig Dispense Refill  . acetaminophen (TYLENOL) 500 MG tablet Take 500-1,000 mg by mouth 2 (two) times daily as needed for moderate pain (FOR HEADACHES/BACK PAIN.).     Marland Kitchen albuterol (PROVENTIL HFA;VENTOLIN HFA) 108 (90 BASE) MCG/ACT inhaler Inhale 2 puffs into the lungs every 6 (six) hours as needed for wheezing or shortness of breath.     . ALPRAZolam (XANAX) 0.25 MG tablet Take 0.25 mg by mouth 3 (three) times daily as needed for anxiety.     Marland Kitchen atenolol (TENORMIN) 50 MG tablet Take 50 mg by mouth daily.    Marland Kitchen  Calcium Carbonate-Vitamin D (CALTRATE 600+D PO) Take 1 tablet by mouth daily. Chewable    . denosumab (PROLIA) 60 MG/ML SOSY injection Inject 60 mg into the skin every 6 (six) months.     . eletriptan (RELPAX) 40 MG tablet Take 40 mg by mouth as needed for migraine or headache. May repeat in 2 hours if headache persists or recurs.    . gabapentin (NEURONTIN) 100 MG capsule Take 1 pill in am ; and 2 pills in PM. 90 capsule 6  . Lifitegrast (XIIDRA) 5 % SOLN Place 1 drop into both eyes 2 (two) times daily as needed (for excessively dry eyes.).    Marland Kitchen pantoprazole (PROTONIX) 40 MG tablet Take 40 mg by mouth daily.    Marland Kitchen Propylene Glycol-Glycerin (SOOTHE) 0.6-0.6 % SOLN Place 1-2 drops into both eyes 5 (five) times daily as needed (for dry eyes.). SOOTHE XP    . spironolactone (ALDACTONE) 50 MG tablet Take 25 mg by mouth daily.     . vitamin B-12 (CYANOCOBALAMIN) 1000 MCG tablet Take 1,000 mcg by mouth daily.    . vitamin E 1000 UNIT capsule Take 1,000 Units by mouth daily.    Marland Kitchen zolpidem (AMBIEN) 10 MG tablet  Take 10 mg by mouth at bedtime as needed for sleep.     No current facility-administered medications for this visit.    PHYSICAL EXAMINATION: ECOG PERFORMANCE STATUS: 1 - Symptomatic but completely ambulatory  BP 132/77 (BP Location: Left Arm, Patient Position: Sitting, Cuff Size: Normal)   Pulse 61   Temp 98.9 F (37.2 C) (Tympanic)   Wt 119 lb 2 oz (54 kg)   BMI 19.82 kg/m   Filed Weights   09/16/19 1449  Weight: 119 lb 2 oz (54 kg)    Physical Exam  Constitutional: She is oriented to person, place, and time and well-developed, well-nourished, and in no distress.  HENT:  Head: Normocephalic and atraumatic.  Mouth/Throat: Oropharynx is clear and moist. No oropharyngeal exudate.  Eyes: Pupils are equal, round, and reactive to light.  Cardiovascular: Normal rate and regular rhythm.  Pulmonary/Chest: No respiratory distress. She has no wheezes.  Decreased air entry bilaterally. No wheeze or crackles.  Abdominal: Soft. Bowel sounds are normal. She exhibits no distension and no mass. There is no abdominal tenderness. There is no rebound and no guarding.  Musculoskeletal:        General: No tenderness or edema. Normal range of motion.     Cervical back: Normal range of motion and neck supple.  Neurological: She is alert and oriented to person, place, and time.  Skin: Skin is warm.     Psychiatric: Affect normal.       LABORATORY DATA:  I have reviewed the data as listed    Component Value Date/Time   NA 137 03/06/2018 1302   NA 136 02/06/2012 0957   K 4.1 03/06/2018 1302   K 3.8 02/06/2012 0957   CL 102 03/06/2018 1302   CO2 27 03/06/2018 1302   GLUCOSE 95 03/06/2018 1302   BUN 8 03/06/2018 1302   CREATININE 0.80 09/09/2019 1000   CREATININE 0.97 11/12/2013 1508   CALCIUM 9.0 03/06/2018 1302   CALCIUM 9.5 05/08/2012 1018   PROT 7.4 03/06/2018 1302   PROT 7.6 11/12/2013 1508   ALBUMIN 3.9 03/06/2018 1302   ALBUMIN 3.7 11/12/2013 1508   AST 18 03/06/2018 1302    AST 10 (L) 11/12/2013 1508   ALT 14 03/06/2018 1302   ALT 15 11/12/2013 1508  ALKPHOS 43 03/06/2018 1302   ALKPHOS 49 11/12/2013 1508   BILITOT 1.3 (H) 03/06/2018 1302   BILITOT 0.7 11/12/2013 1508   GFRNONAA >60 03/06/2018 1302   GFRNONAA >60 11/12/2013 1508   GFRAA >60 03/06/2018 1302   GFRAA >60 11/12/2013 1508    No results found for: SPEP, UPEP  Lab Results  Component Value Date   WBC 7.6 03/06/2018   NEUTROABS 5.1 03/06/2018   HGB 13.1 03/06/2018   HCT 39.4 03/06/2018   MCV 96.1 03/06/2018   PLT 204 03/06/2018      Chemistry      Component Value Date/Time   NA 137 03/06/2018 1302   NA 136 02/06/2012 0957   K 4.1 03/06/2018 1302   K 3.8 02/06/2012 0957   CL 102 03/06/2018 1302   CO2 27 03/06/2018 1302   BUN 8 03/06/2018 1302   CREATININE 0.80 09/09/2019 1000   CREATININE 0.97 11/12/2013 1508      Component Value Date/Time   CALCIUM 9.0 03/06/2018 1302   CALCIUM 9.5 05/08/2012 1018   ALKPHOS 43 03/06/2018 1302   ALKPHOS 49 11/12/2013 1508   AST 18 03/06/2018 1302   AST 10 (L) 11/12/2013 1508   ALT 14 03/06/2018 1302   ALT 15 11/12/2013 1508   BILITOT 1.3 (H) 03/06/2018 1302   BILITOT 0.7 11/12/2013 1508       RADIOGRAPHIC STUDIES: I have personally reviewed the radiological images as listed and agreed with the findings in the report. No results found.   ASSESSMENT & PLAN:  Primary cancer of left upper lobe of lung ([place]) #Left upper lobe adenocarcinoma stage I; June 2021- CT chest NED; STABLE.  Continue surveillance CT scan  Annual.   # Bil Lower Lung nodules- likely benign-May 2021-STABLE. continue surveillance.   # Post thoracotomy pain-worse; currently on tramadol; increased neurontin 200 mg in AM; 200 mg in PM;   # History of breast cancer stage I-stable  # BMD- April 2019- Osteoporosis- ca+vit D BID; On reclast every 12 months/PCP;-stable  # Transient left side neck swelling-currently resolved.  Unclear etiology.  Monitor for now.  If  reoccurs-patient will inform us.  # DISPOSITION: # Follow-up in 6 months-MD; labs- cbc/cmp- Dr.B   # I reviewed the blood work- with the patient in detail; also reviewed the imaging independently [as summarized above]; and with the patient in detail.    Orders Placed This Encounter  Procedures  . CBC with Differential    Standing Status:   Future    Standing Expiration Date:   09/15/2020  . Comprehensive metabolic panel    Standing Status:   Future    Standing Expiration Date:   09/15/2020   All questions were answered. The patient knows to call the clinic with any problems, questions or concerns.      Cammie Sickle, MD 09/16/2019 8:00 PM

## 2019-10-07 ENCOUNTER — Other Ambulatory Visit: Payer: Self-pay | Admitting: Internal Medicine

## 2020-02-01 IMAGING — CT CT CHEST LUNG CANCER SCREENING LOW DOSE W/O CM
3 of 5 series · 13 of 40 positions shown, 14 images · non-contrast
Comparison: 06/14/2015 diagnostic CT.  No prior screening CT.

CLINICAL DATA: Current smoker. Forty-five pack-year history.
Asymptomatic. History of left breast cancer in [age].

EXAM:
CT CHEST WITHOUT CONTRAST LOW-DOSE FOR LUNG CANCER SCREENING
TECHNIQUE: Multidetector CT imaging of the chest was performed following the
standard protocol without IV contrast.

[Series 2: lung · axial · 0.55mm/px · z∈[-1204,-1084]mm · 2 of 72 slices shown, 3 images (1 of 3)]
[im 24/72  mediastinal]
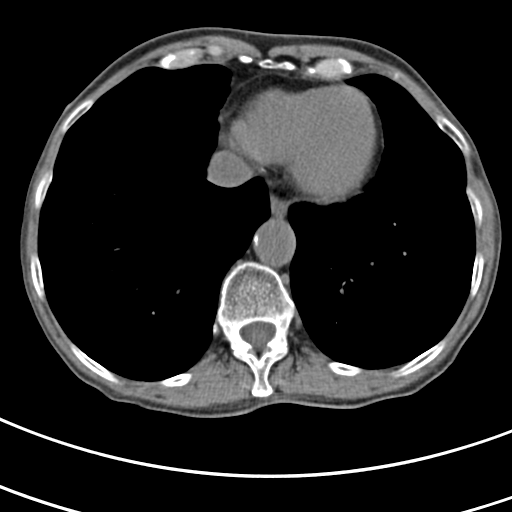
[im 24/72  lung]
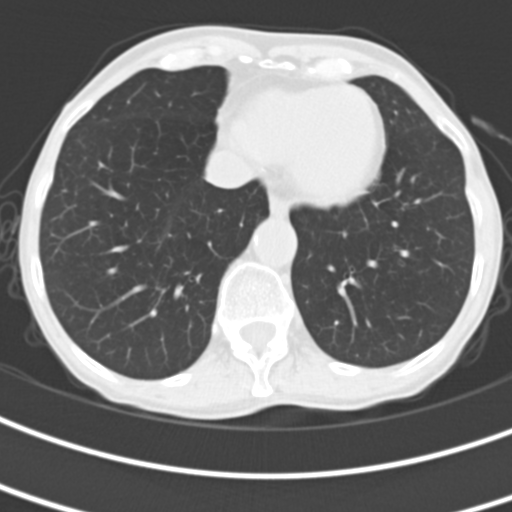
[im 48/72  lung]
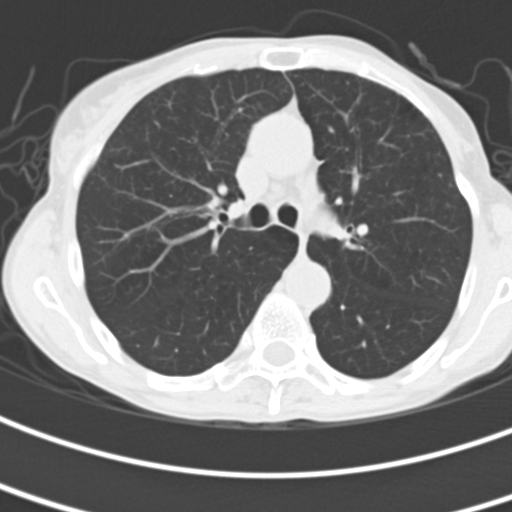

[Series 3: lung · axial · 0.55mm/px · z∈[-1249,-991]mm · 8 of 316 slices shown (2 of 3)]
[im 29/316  lung]
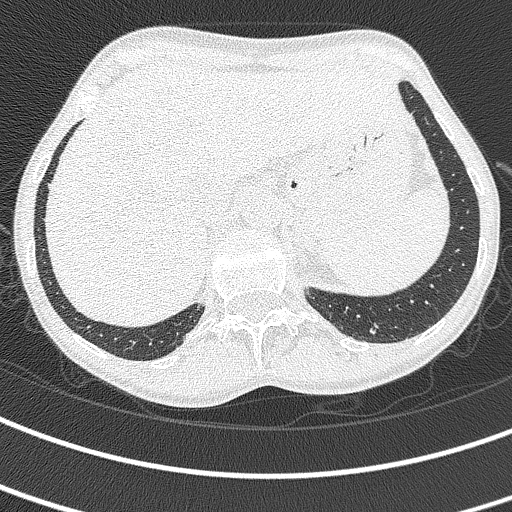
[im 58/316  lung]
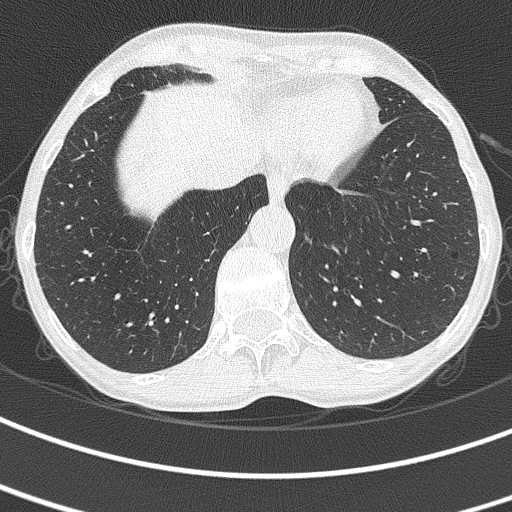
[im 101/316  lung]
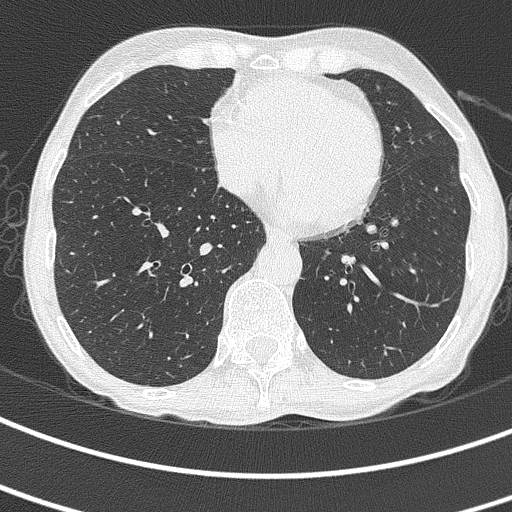
[im 144/316  lung]
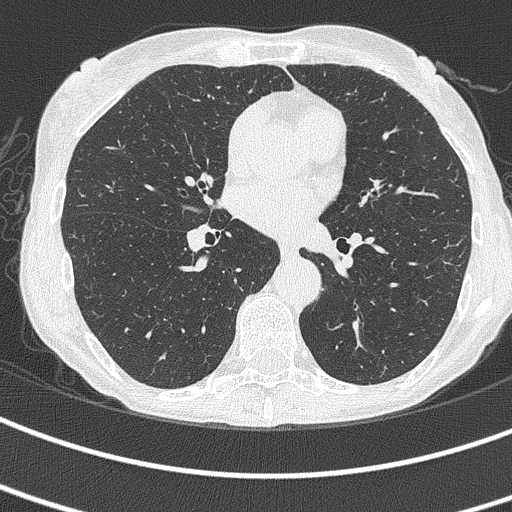
[im 172/316  lung]
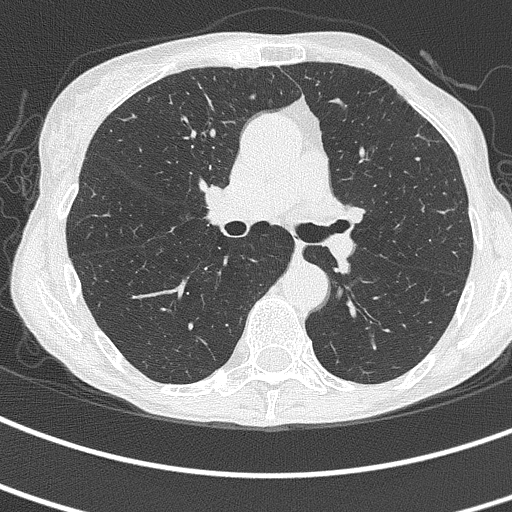
[im 215/316  lung]
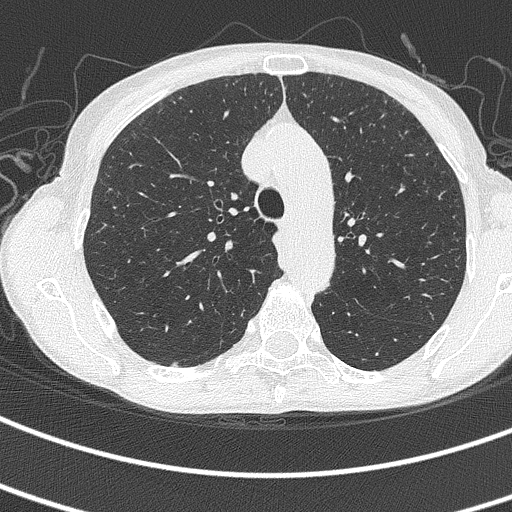
[im 258/316  lung]
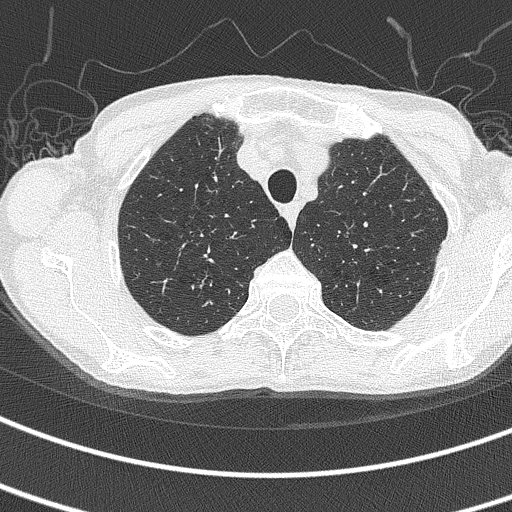
[im 287/316  lung]
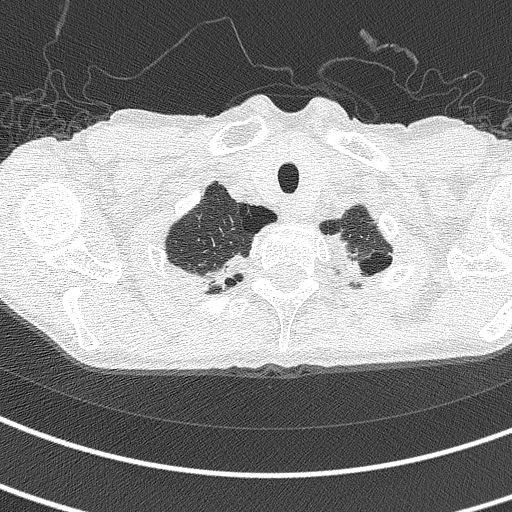

[Series 4: lung · coronal · 0.55mm/px · 3 of 281 slices shown (3 of 3)]
[im 57/281  lung]
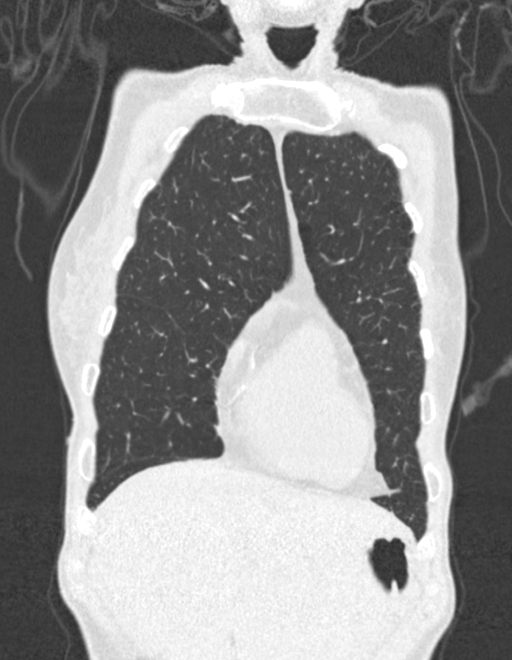
[im 113/281  lung]
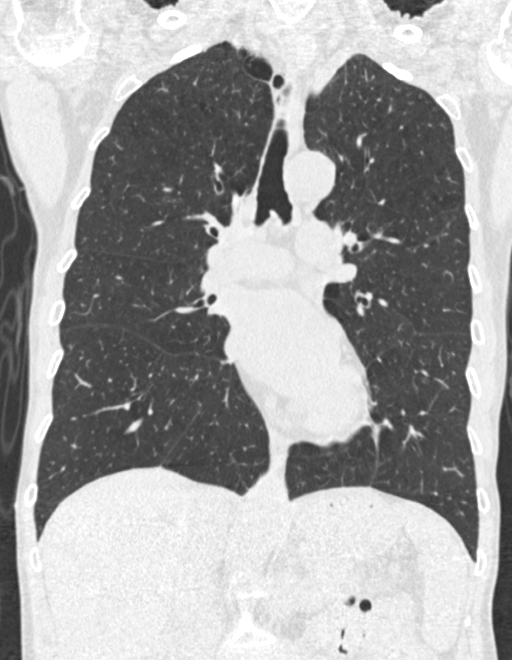
[im 169/281  lung]
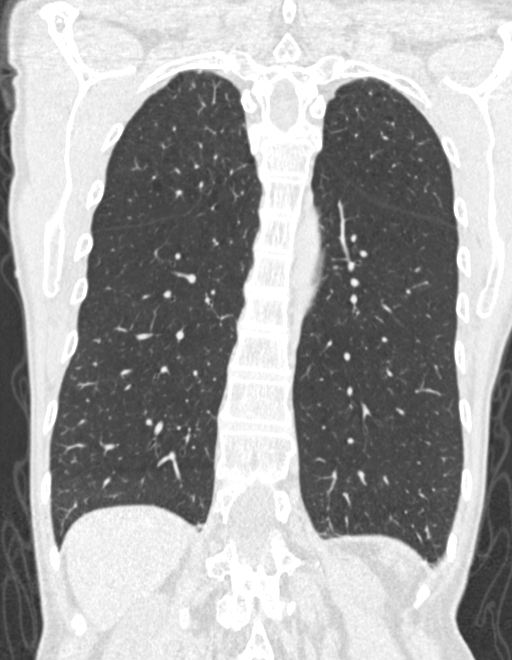

[13 of 40 positions shown; findings below may reference images not displayed]

FINDINGS: Cardiovascular: Aortic and branch vessel atherosclerosis. Tortuous
thoracic aorta. Normal heart size, without pericardial effusion.
Multivessel coronary artery atherosclerosis.

Mediastinum/Nodes: No axillary or subpectoral adenopathy. No
mediastinal or definite hilar adenopathy, given limitations of
unenhanced CT.

Lungs/Pleura: No pleural fluid. Moderate centrilobular and mild
paraseptal emphysema. Mild biapical pleuroparenchymal scarring. A
dominant left apical pulmonary nodule, felt to be separate from the
adjacent pleuroparenchymal scarring, is irregular and measures
volume derived equivalent diameter 9.8 mm, including on image 47/3.
This has enlarged when compared back to 07/15/2014.

Other pulmonary nodules measure maximally volume derived equivalent
diameter 2.8 mm.

Upper Abdomen: Cholecystectomy. High left hepatic lobe low-density
lesions are likely cysts. Normal imaged portions of the spleen,
stomach, pancreas, kidneys, right adrenal gland. Mild left adrenal
thickening is unchanged. Abdominal aortic atherosclerosis.

Musculoskeletal: Remote left rib trauma.
IMPRESSION: 1. Lung-RADS 4B, suspicious. Additional imaging evaluation or
consultation with Pulmonology or Thoracic Surgery recommended.
Irregular left apical pulmonary nodule measures volume derived
equivalent diameter 9.8 mm.
2.  Aortic atherosclerosis ([id number]-[id number].B)and emphysema ([id number]-98I.1).
3. Age advanced coronary artery atherosclerosis. Recommend
assessment of coronary risk factors and consideration of medical
therapy.

These results will be called to the ordering clinician or
representative by the Radiologist Assistant, and communication
documented in the PACS or zVision Dashboard.

## 2020-02-03 ENCOUNTER — Other Ambulatory Visit: Payer: Self-pay

## 2020-02-03 DIAGNOSIS — Z1231 Encounter for screening mammogram for malignant neoplasm of breast: Secondary | ICD-10-CM

## 2020-03-17 ENCOUNTER — Other Ambulatory Visit: Payer: Medicare Other

## 2020-03-17 ENCOUNTER — Ambulatory Visit: Payer: Medicare Other | Admitting: Internal Medicine

## 2020-03-24 ENCOUNTER — Other Ambulatory Visit: Payer: Self-pay

## 2020-03-24 ENCOUNTER — Ambulatory Visit
Admission: RE | Admit: 2020-03-24 | Discharge: 2020-03-24 | Disposition: A | Discharge status: Home / Self Care | Payer: Medicare Other | Source: Ambulatory Visit | Attending: Internal Medicine | Admitting: Internal Medicine

## 2020-03-24 DIAGNOSIS — Z1231 Encounter for screening mammogram for malignant neoplasm of breast: Secondary | ICD-10-CM | POA: Insufficient documentation

## 2020-03-24 NOTE — Progress Notes (Signed)
Cardiology Office Note:    Date:  03/25/2020   ID:  Katie Franklin, DOB 1955-08-07, MRN [id number]  PCP:  [name], MD  [facility] Cardiologist:  No primary care provider on file.  [facility] Electrophysiologist:  None   Referring MD: [name], MD   Chief Complaint: 12 month f/u  History of Present Illness:    Katie Franklin is a 64 y.o. female with a hx of aortic atherosclerosis on CT scan, COPD, prior tobacco use, HLD with rash on crestor, left ear deafness, and palpitations who is being seen for 1 year follow-up. She had a heart monitor in 2019 showing NSR with 6 brief episodes of SVT/strial tachycardia and rare PVC/PAC.   Today, she reports she is overall doing well. Denies any change in her medicines or history. She is getting ready for endoscopy later this month. Patient stopped taking zetia because it made her feel lightheaded and dizzy, felt her balance was off. Since COVID she has not been going to the gym although she tries to stay very active, walking around her house and outside. She lives with her wife and they are able to take care of themselves. Diet is generally healthy, cooking at home a lot. Reports some weight gain after having lung cancer. Weight up 98 to 123lbs. Denies shortness of breath, chest pain. She has very rare palpitations. N0 LLE, orthopnea, PND, nausea, vomiting, fever, chills. Regularly sees PCP, has lab work next week. She recently had her COVID booster.   She had labs in 09/2019 at outside clinic. LDL 152. CBC and BMET unremarkable.  Past Medical History:  Diagnosis Date  . Breast cancer ([place]) 2012   left breast  . COPD (chronic obstructive pulmonary disease) ([place])   . Deafness in left ear   . Depression   . Diverticulosis   . 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 Family history of adverse reaction to anesthesia    mother got sick  . Gastritis   . GERD (gastroesophageal reflux disease)   . H. pylori infection   .  H/O herpes labialis   . Hemorrhoids   . History of chicken pox   . Hyperplastic colon polyp   . Hypertension   . Lung cancer ([place]) 08/2017   surgery only, no rad or chemo tx  . Multinodular goiter (nontoxic)   . Osteoporosis   . Panic disorder   . Personal history of radiation therapy   . Recurrent sinusitis   . Recurrent sinusitis   . Tobacco use   . Tubular adenoma of colon     Past Surgical History:  Procedure Laterality Date  . BACK SURGERY  2018   lumbar  . BREAST EXCISIONAL BIOPSY Left    negative 1980's twice  . BREAST EXCISIONAL BIOPSY Left 2012   [facility]  . BREAST LUMPECTOMY Left 2012   [facility], clear margins, LN neg  . BUNIONECTOMY Right   . CHOLECYSTECTOMY N/A 06/11/2017   Procedure: LAPAROSCOPIC CHOLECYSTECTOMY;  Surgeon: Herbert Pun, MD;  Location: [facility] ORS;  Service: General;  Laterality: N/A;  . COLONOSCOPY WITH PROPOFOL N/A 12/28/2014   Procedure: COLONOSCOPY WITH PROPOFOL;  Surgeon: Lollie Sails, MD;  Location: [facility] ENDOSCOPY;  Service: Endoscopy;  Laterality: N/A;  . COLONOSCOPY WITH PROPOFOL N/A 08/17/2017   Procedure: COLONOSCOPY WITH PROPOFOL;  Surgeon: Lollie Sails, MD;  Location: [facility] ENDOSCOPY;  Service: Endoscopy;  Laterality: N/A;  . cyst throat     base of tongue  .  ESOPHAGOGASTRODUODENOSCOPY    . ESOPHAGOGASTRODUODENOSCOPY (EGD) WITH PROPOFOL N/A 12/02/2015   Procedure: ESOPHAGOGASTRODUODENOSCOPY (EGD) WITH PROPOFOL;  Surgeon: Lollie Sails, MD;  Location: [facility] ENDOSCOPY;  Service: Endoscopy;  Laterality: N/A;  . ESOPHAGOGASTRODUODENOSCOPY (EGD) WITH PROPOFOL N/A 04/13/2017   Procedure: ESOPHAGOGASTRODUODENOSCOPY (EGD) WITH PROPOFOL;  Surgeon: Lollie Sails, MD;  Location: [facility] ENDOSCOPY;  Service: Endoscopy;  Laterality: N/A;  . FLEXIBLE BRONCHOSCOPY N/A 08/27/2017   Procedure: PREOP BRONCHOSCOPY;  Surgeon: Nestor Lewandowsky, MD;  Location: [facility] ORS;  Service: Thoracic;  Laterality: N/A;  . GANGLION CYST EXCISION Right    wrist  .  Incision tendon sheath for trigger finger Left   . LUMBAR LAMINECTOMY/DECOMPRESSION MICRODISCECTOMY Right 11/06/2016   Procedure: RIGHT L5/S1 HEMILAMINECTOMY AND CYST RESECTION L5/S1;  Surgeon: Deetta Perla, MD;  Location: [facility] ORS;  Service: Neurosurgery;  Laterality: Right;  . NASAL SEPTUM SURGERY    . THORACOTOMY Left 08/27/2017   Procedure: THORACOTOMY MAJOR;  Surgeon: Nestor Lewandowsky, MD;  Location: [facility] ORS;  Service: Thoracic;  Laterality: Left;  . TONSILLECTOMY    . TRIGGER FINGER RELEASE Left     Current Medications: Current Meds  Medication Sig  . acetaminophen (TYLENOL) 500 MG tablet Take 500-1,000 mg by mouth 2 (two) times daily as needed for moderate pain (FOR HEADACHES/BACK PAIN.).   Marland Kitchen albuterol (PROVENTIL HFA;VENTOLIN HFA) 108 (90 BASE) MCG/ACT inhaler Inhale 2 puffs into the lungs every 6 (six) hours as needed for wheezing or shortness of breath.   . ALPRAZolam (XANAX) 0.25 MG tablet Take 0.25 mg by mouth 3 (three) times daily as needed for anxiety.   Marland Kitchen atenolol (TENORMIN) 50 MG tablet Take 50 mg by mouth daily.  . Calcium Carbonate-Vitamin D (CALTRATE 600+D PO) Take 1 tablet by mouth daily. Chewable  . denosumab (PROLIA) 60 MG/ML SOSY injection Inject 60 mg into the skin every 6 (six) months.   . eletriptan (RELPAX) 40 MG tablet Take 40 mg by mouth as needed for migraine or headache. May repeat in 2 hours if headache persists or recurs.  . gabapentin (NEURONTIN) 100 MG capsule TAKE 1 CAPSULE EVERY       MORNING AND 2 CAPSULES     EVERY EVENING  . Lifitegrast 5 % SOLN Place 1 drop into both eyes 2 (two) times daily as needed (for excessively dry eyes.).  Marland Kitchen pantoprazole (PROTONIX) 40 MG tablet Take 40 mg by mouth daily.  Marland Kitchen Propylene Glycol-Glycerin 0.6-0.6 % SOLN Place 1-2 drops into both eyes 5 (five) times daily as needed (for dry eyes.). SOOTHE XP  . spironolactone (ALDACTONE) 50 MG tablet Take 25 mg by mouth daily.   . vitamin B-12 (CYANOCOBALAMIN) 1000 MCG tablet Take 1,000  mcg by mouth daily.  . vitamin E 1000 UNIT capsule Take 1,000 Units by mouth daily.  Marland Kitchen zolpidem (AMBIEN) 10 MG tablet Take 10 mg by mouth at bedtime as needed for sleep.     Allergies:   Erythromycin, Septra ds [sulfamethoxazole-trimethoprim], Sulfa antibiotics, Tetracyclines & related, Demeclocycline, and Latex   Social History   Socioeconomic History  . Marital status: Married    Spouse name: Not on file  . Number of children: Not on file  . Years of education: Not on file  . Highest education level: Not on file  Occupational History  . Not on file  Tobacco Use  . Smoking status: Former Smoker    Packs/day: 1.00    Years: 45.00    Pack years: 45.00    Types: Cigarettes  Quit date: 07/26/2017    Years since quitting: 2.6  . Smokeless tobacco: Never Used  Vaping Use  . Vaping Use: Never used  Substance and Sexual Activity  . Alcohol use: No    Alcohol/week: 0.0 standard drinks  . Drug use: No  . Sexual activity: Not on file  Other Topics Concern  . Not on file  Social History Narrative  . Not on file   Social Determinants of Health   Financial Resource Strain: Not on file  Food Insecurity: Not on file  Transportation Needs: Not on file  Physical Activity: Not on file  Stress: Not on file  Social Connections: Not on file     Family History: The patient's *family history includes Alzheimer's disease in her mother; Breast cancer (age of onset: 46) in her maternal aunt; Colon polyps in her mother; Depression in her brother; Heart disease in her father and mother; Hypertension in her father and mother; Prostate cancer in her father.  ROS:   Please see the history of present illness.    All other systems reviewed and are negative.  EKGs/Labs/Other Studies Reviewed:    The following studies were reviewed today:  Heart monitor 12/2017 Normal sinus rhythm  Avg HR of 70 bpm.  6 Supraventricular Tachycardia/atrial tachycardia runs occurred, the run with the  fastest interval lasting 9 beats with a max rate of 158 bpm  Isolated SVEs were rare (<1.0%), SVE Couplets were rare (<1.0%), and SVE Triplets were rare (<1.0%). Isolated VEs were rare (<1.0%), and no VE Couplets or VE Triplets were present.  Most of the patient triggered events were not associated with arrhythmia  Signed, Esmond Plants, MD, Ph.D [facility]   EKG:  EKG is ordered today.  The ekg ordered today demonstrates SB, 56bpm, TWI aVL  Recent Labs: 09/09/2019: Creatinine, Ser 0.80  Recent Lipid Panel    Component Value Date/Time   CHOL 201 (H) 10/29/2017 1150   TRIG 123 10/29/2017 1150   HDL 54 10/29/2017 1150   CHOLHDL 3.7 10/29/2017 1150   VLDL 25 10/29/2017 1150   LDLCALC 122 (H) 10/29/2017 1150     Risk Assessment/Calculations:       Physical Exam:    VS:  BP 126/88 (BP Location: Left Arm, Patient Position: Sitting, Cuff Size: Normal)   Pulse (!) 54   Ht _0  (1.651 m)   Wt 123 lb (55.8 kg)   SpO2 98%   BMI 20.47 kg/m     Wt Readings from Last 3 Encounters:  03/25/20 123 lb (55.8 kg)  09/16/19 119 lb 2 oz (54 kg)  09/09/19 118 lb (53.5 kg)     GEN:  Well nourished, well developed in no acute distress HEENT: Normal NECK: No JVD; No carotid bruits LYMPHATICS: No lymphadenopathy CARDIAC: RRR, no murmurs, rubs, gallops RESPIRATORY:  Clear to auscultation without rales, wheezing or rhonchi  ABDOMEN: Soft, non-tender, non-distended MUSCULOSKELETAL:  No edema; No deformity  SKIN: Warm and dry NEUROLOGIC:  Alert and oriented x 3 PSYCHIATRIC:  Normal affect   ASSESSMENT:    1. Aortic atherosclerosis ([place])   2. Coronary artery disease involving native coronary artery of native heart without angina pectoris   3. Hypertension, unspecified type   4. Palpitations   5. Centrilobular emphysema ([place])   6. History of tobacco use   7. Hyperlipidemia, mixed    PLAN:    In order of problems listed above:  Aortic atherosclerosis and mild CAD per  chest CT 08/2019 Patient has  not had prior ischemic evaluation or echo. She is relatively active, walking up and down stairs daily. She denies anginal symptoms. She has prior intolerance to lipitor (leg cramps) and crestor (rash) She was started on zetia but this made her feel dizzy and lightheaded. Will start Lovalo 33m M,W,F. Recheck FLP/LFTs in 2 months. She cannot tolerate Aspirin due to GI issues.   HTN BP today 126/88. At home BP 120s/80s. She is mildly bradycardic, asymptomatic. Continue atenolol. If she develops symptoms consider decreasing BB. She takes spironolactone 268mdaily for right ear hearing (improves ringing and balance).   Palpitations Prior heart monitor showed brief episodes of SVT/atrial tachycardia and rare PVC/PACS. She experiences rare palpitations. Continue BB.  COPD/emphysema with prior tobacco use with history of lobectomy She quit smoking in 2019. Overall breathing has mildly improved since then. Follows with PCP.  Hyperlipidemia LDL 152 in 09/2019. History of intolerance to statins and zetia. Will start lavalo 70m7mn M/W/F as above. Follow-up FLP/LFTS in 2 months/   Pre-procedure cardiac evaluation Patient has endoscopy at the end of this month. No anginal sympoms. It is okay to proceed with scheduled procedure.  Disposition: Follow up in 6 month(s) with MD/APP   Shared Decision Making/Informed Consent        Signed, Concetta Guion H FNinfa MeekerA-C  03/25/2020 11:57 AM    [place]

## 2020-03-25 ENCOUNTER — Ambulatory Visit (INDEPENDENT_AMBULATORY_CARE_PROVIDER_SITE_OTHER): Payer: Medicare Other | Admitting: Medical

## 2020-03-25 ENCOUNTER — Encounter: Payer: Self-pay | Admitting: Physician Assistant

## 2020-03-25 VITALS — BP 126/88 | HR 54 | Ht 65.0 in | Wt 123.0 lb

## 2020-03-25 DIAGNOSIS — I2510 Atherosclerotic heart disease of native coronary artery without angina pectoris: Secondary | ICD-10-CM

## 2020-03-25 DIAGNOSIS — R002 Palpitations: Secondary | ICD-10-CM

## 2020-03-25 DIAGNOSIS — E782 Mixed hyperlipidemia: Secondary | ICD-10-CM

## 2020-03-25 DIAGNOSIS — I700 Atherosclerosis of aorta: Secondary | ICD-10-CM | POA: Diagnosis not present

## 2020-03-25 DIAGNOSIS — I10 Essential (primary) hypertension: Secondary | ICD-10-CM | POA: Diagnosis not present

## 2020-03-25 DIAGNOSIS — Z87891 Personal history of nicotine dependence: Secondary | ICD-10-CM

## 2020-03-25 DIAGNOSIS — J432 Centrilobular emphysema: Secondary | ICD-10-CM

## 2020-03-25 MED ORDER — LIVALO 2 MG PO TABS: 2.0000 mg | ORAL_TABLET | ORAL | 2 refills | Status: DC

## 2020-03-25 NOTE — Patient Instructions (Signed)
Medication Instructions:  Start taking Livalo 2mg  every Monday, Wednesday, and Friday. This medication was sent through [facility]  *If you need a refill on your cardiac medications before your next appointment, please call your pharmacy*   Lab Work: Fast Lipid panel and LFT (heptic panel) in 2 months  Walk into medical mall at the check in desk, they will direct you to lab registration, hours for labs are Monday-Friday 07:00am-5:30pm (no appointment necessary)  You will receive your results only by: Marland Kitchen MyChart Message (if you have MyChart) OR . A paper copy in the mail If you have any lab test that is abnormal or we need to change your treatment, we will call you to review the results.   Testing/Procedures: None   Follow-Up: At [facility], you and your health needs are our priority.  As part of our continuing mission to provide you with exceptional heart care, we have created designated Provider Care Teams.  These Care Teams include your primary Cardiologist (physician) and Advanced Practice Providers (APPs -  Physician Assistants and Nurse Practitioners) who all work together to provide you with the care you need, when you need it.  We recommend signing up for the patient portal called "MyChart".  Sign up information is provided on this After Visit Summary.  MyChart is used to connect with patients for Virtual Visits (Telemedicine).  Patients are able to view lab/test results, encounter notes, upcoming appointments, etc.  Non-urgent messages can be sent to your provider as well.   To learn more about what you can do with MyChart, go to [email or web address].    Your next appointment:   6 month(s)  The format for your next appointment:   In Person  Provider:   Ida Rogue, MD   Other Instructions N/A

## 2020-03-26 ENCOUNTER — Ambulatory Visit: Payer: Medicare Other | Admitting: Internal Medicine

## 2020-03-26 ENCOUNTER — Other Ambulatory Visit: Payer: Medicare Other

## 2020-03-29 ENCOUNTER — Telehealth: Payer: Self-pay | Admitting: Cardiovascular Disease

## 2020-03-29 DIAGNOSIS — E782 Mixed hyperlipidemia: Secondary | ICD-10-CM | POA: Insufficient documentation

## 2020-03-29 NOTE — Telephone Encounter (Signed)
Received fax from [place] requesting either change to statin instead of Livalo or Prior Authorization. Patient has tried Rosuvastatin and Atorvastatin and Zetia---intolerant to all. Completed Prior Authorization via phone call to [place].  Response will be faxed within 24-72 business hours.  Reference plan participant ID: [id number]

## 2020-03-29 NOTE — Telephone Encounter (Signed)
Pt c/o medication issue:  1. Name of Medication:   Livalo 2mg  every Monday, Wednesday, and Friday.    2. How are you currently taking this medication (dosage and times per day)? new  3. Are you having a reaction (difficulty breathing--STAT)?  no  4. What is your medication issue? [facility]  Per patient it is on hold until they here back from [place] re approval as there is no generic available.   Please call patient to advise

## 2020-03-30 NOTE — Telephone Encounter (Signed)
Faxed signed prescription of Livalo 2mg  Tab, take one tab every Mon, Wed, and Fri. Pt has an intolerance to Rosuvastatin, Atorvastatin and Zetia. Dr. Rockey Situ has signed prescription on formulary mediation request form to have pt authorize for Livalo. Fax sent to [facility].

## 2020-03-30 NOTE — Telephone Encounter (Signed)
Prior Authorization for Livalo (pitavastatin) approved from 03/29/2020 to 03/29/2021.  Patient notified.

## 2020-03-30 NOTE — Telephone Encounter (Signed)
Pt called, advised her that we have faxed a signed prescription of Livalo 2mg  Tab, take one tab every Mon, Wed, and Fri.  to [place]. Hopefully she will have a callback form [facility] with approved medication. Pt reports if she has any more push bach she will call the clinic back for guidance for what she should do next. Otherwise, grateful for callback and update regarding her medication.

## 2020-04-09 ENCOUNTER — Other Ambulatory Visit: Payer: Medicare Other

## 2020-04-12 ENCOUNTER — Encounter: Payer: Self-pay | Admitting: *Deleted

## 2020-04-12 ENCOUNTER — Other Ambulatory Visit: Payer: Self-pay

## 2020-04-12 ENCOUNTER — Other Ambulatory Visit
Admission: RE | Admit: 2020-04-12 | Discharge: 2020-04-12 | Disposition: A | Discharge status: Home / Self Care | Payer: Medicare Other | Source: Ambulatory Visit | Attending: Gastroenterology | Admitting: Gastroenterology

## 2020-04-12 DIAGNOSIS — Z20822 Contact with and (suspected) exposure to covid-19: Secondary | ICD-10-CM | POA: Insufficient documentation

## 2020-04-12 DIAGNOSIS — Z01812 Encounter for preprocedural laboratory examination: Secondary | ICD-10-CM | POA: Insufficient documentation

## 2020-04-12 LAB — SARS CORONAVIRUS 2 (TAT 6-24 HRS): SARS Coronavirus 2: NEGATIVE

## 2020-04-13 ENCOUNTER — Encounter: Payer: Self-pay | Admitting: *Deleted

## 2020-04-13 ENCOUNTER — Encounter
Admission: RE | Disposition: A | Discharge status: Home / Self Care | Payer: Self-pay | Source: Home / Self Care | Attending: Gastroenterology

## 2020-04-13 ENCOUNTER — Ambulatory Visit
Admission: RE | Admit: 2020-04-13 | Discharge: 2020-04-13 | Disposition: A | Discharge status: Home / Self Care | Payer: Medicare Other | Attending: Gastroenterology | Admitting: Gastroenterology

## 2020-04-13 ENCOUNTER — Ambulatory Visit: Payer: Medicare Other | Admitting: Anesthesiology

## 2020-04-13 DIAGNOSIS — Z8719 Personal history of other diseases of the digestive system: Secondary | ICD-10-CM | POA: Insufficient documentation

## 2020-04-13 DIAGNOSIS — Z09 Encounter for follow-up examination after completed treatment for conditions other than malignant neoplasm: Secondary | ICD-10-CM | POA: Diagnosis present

## 2020-04-13 DIAGNOSIS — Z853 Personal history of malignant neoplasm of breast: Secondary | ICD-10-CM | POA: Diagnosis not present

## 2020-04-13 DIAGNOSIS — Z79899 Other long term (current) drug therapy: Secondary | ICD-10-CM | POA: Diagnosis not present

## 2020-04-13 DIAGNOSIS — Z87891 Personal history of nicotine dependence: Secondary | ICD-10-CM | POA: Diagnosis not present

## 2020-04-13 DIAGNOSIS — Z85118 Personal history of other malignant neoplasm of bronchus and lung: Secondary | ICD-10-CM | POA: Insufficient documentation

## 2020-04-13 DIAGNOSIS — Z9049 Acquired absence of other specified parts of digestive tract: Secondary | ICD-10-CM | POA: Diagnosis not present

## 2020-04-13 DIAGNOSIS — K2950 Unspecified chronic gastritis without bleeding: Secondary | ICD-10-CM | POA: Insufficient documentation

## 2020-04-13 HISTORY — PX: ESOPHAGOGASTRODUODENOSCOPY: SHX5428

## 2020-04-13 SURGERY — EGD (ESOPHAGOGASTRODUODENOSCOPY)
Anesthesia: General

## 2020-04-13 MED ORDER — PROPOFOL 500 MG/50ML IV EMUL
INTRAVENOUS | Status: AC
  Filled 2020-04-13: qty 50

## 2020-04-13 MED ORDER — LIDOCAINE HCL (CARDIAC) PF 100 MG/5ML IV SOSY
PREFILLED_SYRINGE | INTRAVENOUS | Status: DC | PRN
  Administered 2020-04-13: 60 mg via INTRATRACHEAL

## 2020-04-13 MED ORDER — PROPOFOL 10 MG/ML IV BOLUS
INTRAVENOUS | Status: AC
  Filled 2020-04-13: qty 20

## 2020-04-13 MED ORDER — LIDOCAINE HCL (PF) 2 % IJ SOLN
INTRAMUSCULAR | Status: AC
  Filled 2020-04-13: qty 5

## 2020-04-13 MED ORDER — GLYCOPYRROLATE 0.2 MG/ML IJ SOLN
INTRAMUSCULAR | Status: AC
  Filled 2020-04-13: qty 1

## 2020-04-13 MED ORDER — PROPOFOL 10 MG/ML IV BOLUS
INTRAVENOUS | Status: DC | PRN
  Administered 2020-04-13: 20 mg via INTRAVENOUS
  Administered 2020-04-13: 40 mg via INTRAVENOUS
  Administered 2020-04-13: 20 mg via INTRAVENOUS

## 2020-04-13 MED ORDER — GLYCOPYRROLATE 0.2 MG/ML IJ SOLN
INTRAMUSCULAR | Status: DC | PRN
  Administered 2020-04-13: .2 mg via INTRAVENOUS

## 2020-04-13 MED ORDER — PROPOFOL 500 MG/50ML IV EMUL
INTRAVENOUS | Status: DC | PRN
  Administered 2020-04-13: 140 ug/kg/min via INTRAVENOUS

## 2020-04-13 MED ORDER — SODIUM CHLORIDE 0.9 % IV SOLN
INTRAVENOUS | Status: DC
  Administered 2020-04-13: 11:00:00 1000 mL via INTRAVENOUS

## 2020-04-13 NOTE — Anesthesia Procedure Notes (Signed)
Procedure Name: MAC Date/Time: 04/13/2020 11:52 AM Performed by: Lily Peer, Kadi Hession, CRNA Pre-anesthesia Checklist: Patient identified, Emergency Drugs available, Suction available, Patient being monitored and Timeout performed Patient Re-evaluated:Patient Re-evaluated prior to induction Oxygen Delivery Method: Nasal cannula Induction Type: IV induction

## 2020-04-13 NOTE — Interval H&P Note (Signed)
History and Physical Interval Note:  04/13/2020 11:36 AM  Katie Franklin  has presented today for surgery, with the diagnosis of BARRETT'S ESOPHAGUS.  The various methods of treatment have been discussed with the patient and family. After consideration of risks, benefits and other options for treatment, the patient has consented to  Procedure(s): ESOPHAGOGASTRODUODENOSCOPY (EGD) (N/A) as a surgical intervention.  The patient's history has been reviewed, patient examined, no change in status, stable for surgery.  I have reviewed the patient's chart and labs.  Questions were answered to the patient's satisfaction.     Lesly Rubenstein  Ok to proceed with EGD

## 2020-04-13 NOTE — H&P (Signed)
Outpatient short stay form Pre-procedure 04/13/2020 11:33 AM Katie Miyamoto MD, MPH  Primary Physician: Dr. Ola Spurr  Reason for visit:  BE's  History of present illness:   64 y/o lady with reported history of BE's here for surveillance. No blood thinners. No family history of GI malignancies. History of cholecystectomy.    Current Facility-Administered Medications:  .  0.9 %  sodium chloride infusion, , Intravenous, Continuous, Taydon Nasworthy, Hilton Cork, MD, Last Rate: 20 mL/hr at 04/13/20 1037, 1,000 mL at 04/13/20 1037  Medications Prior to Admission  Medication Sig Dispense Refill Last Dose  . acetaminophen (TYLENOL) 500 MG tablet Take 500-1,000 mg by mouth 2 (two) times daily as needed for moderate pain (FOR HEADACHES/BACK PAIN.).    Past Week at Unknown time  . ALPRAZolam (XANAX) 0.25 MG tablet Take 0.25 mg by mouth 3 (three) times daily as needed for anxiety.    04/12/2020 at Unknown time  . atenolol (TENORMIN) 50 MG tablet Take 50 mg by mouth daily.   04/13/2020 at 0800  . Calcium Carbonate-Vitamin D (CALTRATE 600+D PO) Take 1 tablet by mouth daily. Chewable   04/12/2020 at Unknown time  . gabapentin (NEURONTIN) 100 MG capsule TAKE 1 CAPSULE EVERY       MORNING AND 2 CAPSULES     EVERY EVENING 90 capsule 4 Past Week at Unknown time  . hydroxypropyl methylcellulose / hypromellose (ISOPTO TEARS / GONIOVISC) 2.5 % ophthalmic solution Place 1 drop into both eyes as needed for dry eyes.   04/12/2020 at Unknown time  . Lifitegrast 5 % SOLN Place 1 drop into both eyes 2 (two) times daily as needed (for excessively dry eyes.).   04/12/2020 at Unknown time  . pantoprazole (PROTONIX) 40 MG tablet Take 40 mg by mouth daily.   04/13/2020 at 0800  . spironolactone (ALDACTONE) 50 MG tablet Take 25 mg by mouth daily.    04/12/2020 at Unknown time  . traMADol (ULTRAM) 50 MG tablet Take 25-50 mg by mouth 2 (two) times daily as needed for moderate pain.   Past Week at Unknown time  . vitamin B-12  (CYANOCOBALAMIN) 1000 MCG tablet Take 1,000 mcg by mouth daily.   04/12/2020 at Unknown time  . vitamin E 1000 UNIT capsule Take 1,000 Units by mouth daily.   04/12/2020 at Unknown time  . albuterol (PROVENTIL HFA;VENTOLIN HFA) 108 (90 BASE) MCG/ACT inhaler Inhale 2 puffs into the lungs every 6 (six) hours as needed for wheezing or shortness of breath.     at prn  . denosumab (PROLIA) 60 MG/ML SOSY injection Inject 60 mg into the skin every 6 (six) months.  (Patient not taking: Reported on 04/12/2020)   Not Taking at Unknown time  . eletriptan (RELPAX) 40 MG tablet Take 40 mg by mouth as needed for migraine or headache. May repeat in 2 hours if headache persists or recurs.    at prn  . Pitavastatin Calcium (LIVALO) 2 MG TABS Take 1 tablet (2 mg total) by mouth every Monday, Wednesday, and Friday. (Patient not taking: Reported on 04/13/2020) 51 tablet 2 Not Taking at Unknown time  . Polyethyl Glyc-Propyl Glyc PF (SYSTANE ULTRA PF) 0.4-0.3 % SOLN Apply 1 drop to eye as needed (both eye for dry eyes). (Patient not taking: Reported on 04/13/2020)   Not Taking at Unknown time  . Propylene Glycol-Glycerin 0.6-0.6 % SOLN Place 1-2 drops into both eyes 5 (five) times daily as needed (for dry eyes.). SOOTHE XP (Patient not taking: Reported on 04/13/2020)  Not Taking at Unknown time  . zolpidem (AMBIEN) 10 MG tablet Take 10 mg by mouth at bedtime as needed for sleep.    at prn     Allergies  Allergen Reactions  . Erythromycin Anaphylaxis and Rash  . Septra Ds [Sulfamethoxazole-Trimethoprim] Anaphylaxis    Neck swelling as well   . Sulfa Antibiotics Anaphylaxis  . Sulfasalazine Anaphylaxis  . Tetracyclines & Related Hives and Itching  . Aspirin Other (See Comments)    Abdominal Pain   . Demeclocycline   . Latex Rash    Plastic bandaids/ gloves     Past Medical History:  Diagnosis Date  . Breast cancer ([place]) 2012   left breast  . COPD (chronic obstructive pulmonary disease) ([place])   . Deafness  in left ear   . Depression   . Diverticulosis   . 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 Family history of adverse reaction to anesthesia    mother got sick  . Gastritis   . GERD (gastroesophageal reflux disease)   . H. pylori infection   . H/O herpes labialis   . Hemorrhoids   . History of chicken pox   . Hyperplastic colon polyp   . Hypertension   . Lung cancer ([place]) 08/2017   surgery only, no rad or chemo tx  . Multinodular goiter (nontoxic)   . Osteoporosis   . Panic disorder   . Personal history of radiation therapy   . Recurrent sinusitis   . Recurrent sinusitis   . Tobacco use   . Tubular adenoma of colon     Review of systems:  Otherwise negative.    Physical Exam  Gen: Alert, oriented. Appears stated age.  HEENT: PERRLA. Lungs: No respiratory distress CV: RRR Abd: soft, benign, no masses Ext: No edema    Planned procedures: Proceed with EGD. The patient understands the nature of the planned procedure, indications, risks, alternatives and potential complications including but not limited to bleeding, infection, perforation, damage to internal organs and possible oversedation/side effects from anesthesia. The patient agrees and gives consent to proceed.  Please refer to procedure notes for findings, recommendations and patient disposition/instructions.     Katie Miyamoto MD, MPH Gastroenterology 04/13/2020  11:33 AM

## 2020-04-13 NOTE — Anesthesia Postprocedure Evaluation (Signed)
Anesthesia Post Note  Patient: Katie Franklin  Procedure(s) Performed: ESOPHAGOGASTRODUODENOSCOPY (EGD) (N/A )  Patient location during evaluation: Endoscopy Anesthesia Type: General Level of consciousness: awake and alert Pain management: pain level controlled Vital Signs Assessment: post-procedure vital signs reviewed and stable Respiratory status: spontaneous breathing and respiratory function stable Cardiovascular status: stable Anesthetic complications: no   No complications documented.   Last Vitals:  Vitals:   04/13/20 1026 04/13/20 1200  BP: 116/82 114/61  Pulse: (!) 59 66  Resp: 16 20  Temp: (!) 36.2 C (!) 36.1 C  SpO2: 100% 99%    Last Pain:  Vitals:   04/13/20 1200  TempSrc: Temporal  PainSc: 0-No pain                 Joylene Wescott K

## 2020-04-13 NOTE — Op Note (Signed)
[facility] Gastroenterology Patient Name: Katie Franklin Procedure Date: 04/13/2020 11:24 AM MRN: [id number] Account #: [id number] Date of Birth: 02/04/1956 Admit Type: Outpatient Age: 64 Room: [facility] 352 ENDO ROOM 1 Gender: Female Note Status: Finalized Procedure:             Upper GI endoscopy Indications:           Follow-up of Barrett's esophagus Providers:             Andrey Farmer MD, MD Medicines:             Monitored Anesthesia Care Complications:         No immediate complications. Estimated blood loss:                         Minimal. Procedure:             Pre-Anesthesia Assessment:                        - Prior to the procedure, a History and Physical was                         performed, and patient medications and allergies were                         reviewed. The patient is competent. The risks and                         benefits of the procedure and the sedation options and                         risks were discussed with the patient. All questions                         were answered and informed consent was obtained.                         Patient identification and proposed procedure were                         verified by the physician, the nurse, the anesthetist                         and the technician in the endoscopy suite. Mental                         Status Examination: alert and oriented. Airway                         Examination: normal oropharyngeal airway and neck                         mobility. Respiratory Examination: clear to                         auscultation. CV Examination: normal. Prophylactic                         Antibiotics: The patient does not require prophylactic  antibiotics. Prior Anticoagulants: The patient has                         taken no previous anticoagulant or antiplatelet                         agents. ASA Grade Assessment: II - A patient with mild                          systemic disease. After reviewing the risks and                         benefits, the patient was deemed in satisfactory                         condition to undergo the procedure. The anesthesia                         plan was to use monitored anesthesia care (MAC).                         Immediately prior to administration of medications,                         the patient was re-assessed for adequacy to receive                         sedatives. The heart rate, respiratory rate, oxygen                         saturations, blood pressure, adequacy of pulmonary                         ventilation, and response to care were monitored                         throughout the procedure. The physical status of the                         patient was re-assessed after the procedure.                        After obtaining informed consent, the endoscope was                         passed under direct vision. Throughout the procedure,                         the patient's blood pressure, pulse, and oxygen                         saturations were monitored continuously. The Endoscope                         was introduced through the mouth, and advanced to the                         second part of duodenum. The upper GI endoscopy was  accomplished without difficulty. The patient tolerated                         the procedure well. Findings:      The esophagus and gastroesophageal junction were examined with white       light and narrow band imaging (NBI). There was no visual evidence of       Barrett's esophagus.      Localized mild inflammation characterized by erythema was found in the       gastric antrum. Biopsies were taken with a cold forceps for Helicobacter       pylori testing. Estimated blood loss was minimal.      The examined duodenum was normal. Impression:            - There is no endoscopic evidence of Barrett's                         esophagus.                         - Gastritis. Biopsied.                        - Normal examined duodenum. Recommendation:        - Discharge patient to home.                        - Resume previous diet.                        - Continue present medications.                        - Await pathology results.                        - Return to referring physician as previously                         scheduled. Procedure Code(s):     --- Professional ---                        [phone], Esophagogastroduodenoscopy, flexible,                         transoral; with biopsy, single or multiple Diagnosis Code(s):     --- Professional ---                        K22.70, Barrett's esophagus without dysplasia                        K29.70, Gastritis, unspecified, without bleeding CPT copyright 2019 American Medical Association. All rights reserved. The codes documented in this report are preliminary and upon coder review may  be revised to meet current compliance requirements. Andrey Farmer, MD Andrey Farmer MD, MD 04/13/2020 11:59:48 AM Number of Addenda: 0 Note Initiated On: 04/13/2020 11:24 AM Estimated Blood Loss:  Estimated blood loss was minimal.      [facility]

## 2020-04-13 NOTE — Transfer of Care (Signed)
Immediate Anesthesia Transfer of Care Note  Patient: Katie Franklin  Procedure(s) Performed: ESOPHAGOGASTRODUODENOSCOPY (EGD) (N/A )  Patient Location: PACU and Endoscopy Unit  Anesthesia Type:General  Level of Consciousness: awake, alert  and oriented  Airway & Oxygen Therapy: Patient Spontanous Breathing  Post-op Assessment: Report given to RN and Post -op Vital signs reviewed and stable  Post vital signs: Reviewed and stable  Last Vitals:  Vitals Value Taken Time  BP 114/61   Temp    Pulse 69   Resp 12   SpO2 99     Last Pain:  Vitals:   04/13/20 1026  TempSrc: Temporal  PainSc: 8       Patients Stated Pain Goal: 0 (67/89/38 1017)  Complications: No complications documented.

## 2020-04-13 NOTE — Anesthesia Preprocedure Evaluation (Addendum)
Anesthesia Evaluation  Patient identified by MRN, date of birth, ID band Patient awake    Reviewed: Allergy & Precautions, NPO status , Patient's Chart, lab work & pertinent test results  History of Anesthesia Complications Negative for: history of anesthetic complications  Airway Mallampati: I       Dental  (+) Edentulous Upper   Pulmonary neg sleep apnea, COPD,  COPD inhaler, Not current smoker, former smoker,           Cardiovascular hypertension, Pt. on medications (-) Past MI and (-) CHF (-) dysrhythmias (-) Valvular Problems/Murmurs     Neuro/Psych neg Seizures Anxiety Depression    GI/Hepatic Neg liver ROS, GERD  Medicated and Controlled,  Endo/Other  neg diabetesHyperthyroidism   Renal/GU negative Renal ROS     Musculoskeletal   Abdominal   Peds  Hematology   Anesthesia Other Findings   Reproductive/Obstetrics                            Anesthesia Physical Anesthesia Plan  ASA: II  Anesthesia Plan: General   Post-op Pain Management:    Induction: Intravenous  PONV Risk Score and Plan: 3 and Propofol infusion, TIVA and Treatment may vary due to age or medical condition  Airway Management Planned: Nasal Cannula  Additional Equipment:   Intra-op Plan:   Post-operative Plan:   Informed Consent: I have reviewed the patients History and Physical, chart, labs and discussed the procedure including the risks, benefits and alternatives for the proposed anesthesia with the patient or authorized representative who has indicated his/her understanding and acceptance.       Plan Discussed with:   Anesthesia Plan Comments:         Anesthesia Quick Evaluation

## 2020-04-14 ENCOUNTER — Encounter: Payer: Self-pay | Admitting: Gastroenterology

## 2020-04-14 LAB — SURGICAL PATHOLOGY

## 2020-04-30 ENCOUNTER — Inpatient Hospital Stay (HOSPITAL_BASED_OUTPATIENT_CLINIC_OR_DEPARTMENT_OTHER): Payer: Medicare Other | Admitting: Internal Medicine

## 2020-04-30 ENCOUNTER — Inpatient Hospital Stay: Payer: Medicare Other | Attending: Internal Medicine

## 2020-04-30 ENCOUNTER — Encounter: Payer: Self-pay | Admitting: Internal Medicine

## 2020-04-30 VITALS — BP 119/84 | HR 56 | Temp 97.7°F | Wt 122.4 lb

## 2020-04-30 DIAGNOSIS — M810 Age-related osteoporosis without current pathological fracture: Secondary | ICD-10-CM | POA: Diagnosis not present

## 2020-04-30 DIAGNOSIS — G8912 Acute post-thoracotomy pain: Secondary | ICD-10-CM | POA: Diagnosis not present

## 2020-04-30 DIAGNOSIS — Z853 Personal history of malignant neoplasm of breast: Secondary | ICD-10-CM | POA: Diagnosis not present

## 2020-04-30 DIAGNOSIS — Z87891 Personal history of nicotine dependence: Secondary | ICD-10-CM | POA: Insufficient documentation

## 2020-04-30 DIAGNOSIS — C3412 Malignant neoplasm of upper lobe, left bronchus or lung: Secondary | ICD-10-CM

## 2020-04-30 LAB — CBC WITH DIFFERENTIAL/PLATELET
Abs Immature Granulocytes: 0.01 10*3/uL (ref 0.00–0.07)
Basophils Absolute: 0.1 10*3/uL (ref 0.0–0.1)
Basophils Relative: 1 %
Eosinophils Absolute: 0.1 10*3/uL (ref 0.0–0.5)
Eosinophils Relative: 2 %
HCT: 40.7 % (ref 36.0–46.0)
Hemoglobin: 14.2 g/dL (ref 12.0–15.0)
Immature Granulocytes: 0 %
Lymphocytes Relative: 42 %
Lymphs Abs: 2.8 10*3/uL (ref 0.7–4.0)
MCH: 32.1 pg (ref 26.0–34.0)
MCHC: 34.9 g/dL (ref 30.0–36.0)
MCV: 91.9 fL (ref 80.0–100.0)
Monocytes Absolute: 0.8 10*3/uL (ref 0.1–1.0)
Monocytes Relative: 12 %
Neutro Abs: 2.8 10*3/uL (ref 1.7–7.7)
Neutrophils Relative %: 43 %
Platelets: 233 10*3/uL (ref 150–400)
RBC: 4.43 MIL/uL (ref 3.87–5.11)
RDW: 12.6 % (ref 11.5–15.5)
WBC: 6.6 10*3/uL (ref 4.0–10.5)
nRBC: 0 % (ref 0.0–0.2)

## 2020-04-30 LAB — COMPREHENSIVE METABOLIC PANEL
ALT: 10 U/L (ref 0–44)
AST: 18 U/L (ref 15–41)
Albumin: 3.9 g/dL (ref 3.5–5.0)
Alkaline Phosphatase: 47 U/L (ref 38–126)
Anion gap: 9 (ref 5–15)
BUN: 10 mg/dL (ref 8–23)
CO2: 25 mmol/L (ref 22–32)
Calcium: 9 mg/dL (ref 8.9–10.3)
Chloride: 103 mmol/L (ref 98–111)
Creatinine, Ser: 0.77 mg/dL (ref 0.44–1.00)
GFR, Estimated: >60 mL/min (ref >60)
Glucose, Bld: 99 mg/dL (ref 70–99)
Potassium: 4 mmol/L (ref 3.5–5.1)
Sodium: 137 mmol/L (ref 135–145)
Total Bilirubin: 0.3 mg/dL (ref 0.3–1.2)
Total Protein: 7.2 g/dL (ref 6.5–8.1)

## 2020-04-30 NOTE — Assessment & Plan Note (Signed)
#  Left upper lobe adenocarcinoma stage I; June 2021- CT chest NED; STABLE; will order CT scan today/in 6 months.    # Post thoracotomy pain--STABLE; currently on tramadol; increased neurontin 200 mg in AM; 200 mg in PM;   # History of breast cancer stage I; DEC 2021- Mammo-WNL- STABLE.   # BMD- April 2019- Osteoporosis- ca+vit D BID; On reclast every 12 months; awaiting dental extraction-PCP-STABLE.   # DISPOSITION: # Follow-up in 6 months-MD; labs- cbc/cmp; CT chest prior-- Dr.

## 2020-04-30 NOTE — Progress Notes (Signed)
Patient here for follow up she reports that her left lower rib cage still hurts from her past lung surgery.

## 2020-04-30 NOTE — Progress Notes (Signed)
[place] OFFICE PROGRESS NOTE  Patient Care Team: [name], MD as PCP - General (Infectious Diseases)  Cancer Staging No matching staging information was found for the patient.   Oncology History Overview Note  # 2012- LEFT BREAST [pt1b- STAGE I; G-2];ER/PR-Pos; her 2 Neu-NEG;  RS- 19 [risk of recurrence 13%];s/p Lumpec& SLNBx; NO chemo; s/p RT; On Aromasin  [finish April 2017]  # MAY 2019- ADENO CA LUL STAGE IA [s/p wedge resection; Dr.Oaks; [facility]]  # Osteoporosis- stopped PO bisphosphonate sec to GI issues; On reclast every 12 months; defer to PCP.  Chronic back pain-Dr.Chasnis   DIAGNOSIS: _0  Breast ca Stage I ER/PR pos; Her 2NEG; LUL adeno stage I  GOALS: curative  CURRENT/MOST RECENT THERAPY- surveillance    Carcinoma of overlapping sites of left breast in female, estrogen receptor positive ([place])  Primary cancer of left upper lobe of lung ([place])    INTERVAL HISTORY:  Katie Franklin 65 y.o.  female pleasant patient above history of breast cancer; and also left upper lobe lung cancer is here for a follow-up.   Patient continues to complain of left chest wall-since her lung surgery.  She continues to been gabapentin.  This is not any worse.  Complains of bilateral foot pain attributes to flatfoot.  There is no nausea no vomiting.  Headaches.  Review of Systems  Constitutional: Negative for chills, diaphoresis, fever, malaise/fatigue and weight loss.  HENT: Negative for nosebleeds and sore throat.   Eyes: Negative for double vision.  Respiratory: Negative for cough, hemoptysis, sputum production, shortness of breath and wheezing.   Cardiovascular: Positive for chest pain. Negative for palpitations, orthopnea and leg swelling.  Gastrointestinal: Negative for abdominal pain, blood in stool, constipation, diarrhea, heartburn, melena, nausea and vomiting.  Genitourinary: Negative for dysuria, frequency and urgency.  Musculoskeletal: Negative for  back pain and joint pain.  Skin: Negative.  Negative for itching and rash.  Neurological: Negative for dizziness, tingling, focal weakness, weakness and headaches.  Endo/Heme/Allergies: Does not bruise/bleed easily.  Psychiatric/Behavioral: Negative for depression. The patient is not nervous/anxious and does not have insomnia.       PAST MEDICAL HISTORY :  Past Medical History:  Diagnosis Date  . Breast cancer ([place]) 2012   left breast  . COPD (chronic obstructive pulmonary disease) ([place])   . Deafness in left ear   . Depression   . Diverticulosis   . 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 Family history of adverse reaction to anesthesia    mother got sick  . Gastritis   . GERD (gastroesophageal reflux disease)   . H. pylori infection   . H/O herpes labialis   . Hemorrhoids   . History of chicken pox   . Hyperplastic colon polyp   . Hypertension   . Lung cancer ([place]) 08/2017   surgery only, no rad or chemo tx  . Multinodular goiter (nontoxic)   . Osteoporosis   . Panic disorder   . Personal history of radiation therapy   . Recurrent sinusitis   . Recurrent sinusitis   . Tobacco use   . Tubular adenoma of colon     PAST SURGICAL HISTORY :   Past Surgical History:  Procedure Laterality Date  . BACK SURGERY  2018   lumbar  . BREAST EXCISIONAL BIOPSY Left    negative 1980's twice  . BREAST EXCISIONAL BIOPSY Left 2012   [facility]  . BREAST LUMPECTOMY Left 2012   [facility], clear  margins, LN neg  . BUNIONECTOMY Right   . CHOLECYSTECTOMY N/A 06/11/2017   Procedure: LAPAROSCOPIC CHOLECYSTECTOMY;  Surgeon: Herbert Pun, MD;  Location: [facility] ORS;  Service: General;  Laterality: N/A;  . COLONOSCOPY WITH PROPOFOL N/A 12/28/2014   Procedure: COLONOSCOPY WITH PROPOFOL;  Surgeon: Lollie Sails, MD;  Location: [facility] ENDOSCOPY;  Service: Endoscopy;  Laterality: N/A;  . COLONOSCOPY WITH PROPOFOL N/A 08/17/2017   Procedure: COLONOSCOPY WITH PROPOFOL;  Surgeon: Lollie Sails, MD;  Location: [facility] ENDOSCOPY;  Service: Endoscopy;  Laterality: N/A;  . cyst throat     base of tongue  . ESOPHAGOGASTRODUODENOSCOPY    . ESOPHAGOGASTRODUODENOSCOPY N/A 04/13/2020   Procedure: ESOPHAGOGASTRODUODENOSCOPY (EGD);  Surgeon: Lesly Rubenstein, MD;  Location: [facility] ENDOSCOPY;  Service: Endoscopy;  Laterality: N/A;  . ESOPHAGOGASTRODUODENOSCOPY (EGD) WITH PROPOFOL N/A 12/02/2015   Procedure: ESOPHAGOGASTRODUODENOSCOPY (EGD) WITH PROPOFOL;  Surgeon: Lollie Sails, MD;  Location: [facility] ENDOSCOPY;  Service: Endoscopy;  Laterality: N/A;  . ESOPHAGOGASTRODUODENOSCOPY (EGD) WITH PROPOFOL N/A 04/13/2017   Procedure: ESOPHAGOGASTRODUODENOSCOPY (EGD) WITH PROPOFOL;  Surgeon: Lollie Sails, MD;  Location: [facility] ENDOSCOPY;  Service: Endoscopy;  Laterality: N/A;  . EYE SURGERY  08/27/2019   cataract extraction right  . EYE SURGERY  09/10/2019   cataract extraction left  . FLEXIBLE BRONCHOSCOPY N/A 08/27/2017   Procedure: PREOP BRONCHOSCOPY;  Surgeon: Nestor Lewandowsky, MD;  Location: [facility] ORS;  Service: Thoracic;  Laterality: N/A;  . GANGLION CYST EXCISION Right    wrist  . Incision tendon sheath for trigger finger Left   . LUMBAR LAMINECTOMY/DECOMPRESSION MICRODISCECTOMY Right 11/06/2016   Procedure: RIGHT L5/S1 HEMILAMINECTOMY AND CYST RESECTION L5/S1;  Surgeon: Deetta Perla, MD;  Location: [facility] ORS;  Service: Neurosurgery;  Laterality: Right;  . MASTECTOMY  07/2010   partial mastectomy left  . NASAL SEPTUM SURGERY    . THORACOTOMY Left 08/27/2017   Procedure: THORACOTOMY MAJOR;  Surgeon: Nestor Lewandowsky, MD;  Location: [facility] ORS;  Service: Thoracic;  Laterality: Left;  . TONSILLECTOMY    . TRIGGER FINGER RELEASE Left     FAMILY HISTORY :   Family History  Problem Relation Age of Onset  . Breast cancer Maternal Aunt 90       great aunt  . Prostate cancer Father   . Hypertension Father   . Heart disease Father   . Alzheimer's disease Mother   . Heart disease Mother   .  Hypertension Mother   . Colon polyps Mother   . Depression Brother        brother #2    SOCIAL HISTORY:   Social History   Tobacco Use  . Smoking status: Former Smoker    Packs/day: 1.00    Years: 45.00    Pack years: 45.00    Types: Cigarettes    Quit date: 07/26/2017    Years since quitting: 2.7  . Smokeless tobacco: Never Used  Vaping Use  . Vaping Use: Never used  Substance Use Topics  . Alcohol use: No    Alcohol/week: 0.0 standard drinks  . Drug use: No    ALLERGIES:  is allergic to erythromycin, septra ds [sulfamethoxazole-trimethoprim], sulfa antibiotics, sulfasalazine, tetracyclines & related, aspirin, demeclocycline, and latex.  MEDICATIONS:  Current Outpatient Medications  Medication Sig Dispense Refill  . acetaminophen (TYLENOL) 500 MG tablet Take 500-1,000 mg by mouth 2 (two) times daily as needed for moderate pain (FOR HEADACHES/BACK PAIN.).     Marland Kitchen albuterol (PROVENTIL HFA;VENTOLIN HFA) 108 (90 BASE) MCG/ACT inhaler Inhale 2 puffs into the  lungs every 6 (six) hours as needed for wheezing or shortness of breath.     . ALPRAZolam (XANAX) 0.25 MG tablet Take 0.25 mg by mouth 3 (three) times daily as needed for anxiety.     Marland Kitchen atenolol (TENORMIN) 50 MG tablet Take 50 mg by mouth daily.    . Calcium Carbonate-Vitamin D (CALTRATE 600+D PO) Take 1 tablet by mouth daily. Chewable    . denosumab (PROLIA) 60 MG/ML SOSY injection Inject 60 mg into the skin every 6 (six) months.    . eletriptan (RELPAX) 40 MG tablet Take 40 mg by mouth as needed for migraine or headache. May repeat in 2 hours if headache persists or recurs.    . gabapentin (NEURONTIN) 100 MG capsule TAKE 1 CAPSULE EVERY       MORNING AND 2 CAPSULES     EVERY EVENING 90 capsule 4  . hydroxypropyl methylcellulose / hypromellose (ISOPTO TEARS / GONIOVISC) 2.5 % ophthalmic solution Place 1 drop into both eyes as needed for dry eyes.    . Lifitegrast 5 % SOLN Place 1 drop into both eyes 2 (two) times daily as needed  (for excessively dry eyes.).    Marland Kitchen pantoprazole (PROTONIX) 40 MG tablet Take 40 mg by mouth daily.    . Pitavastatin Calcium (LIVALO) 2 MG TABS Take 1 tablet by mouth 3 (three) times a week. Monday, Wednesday,Friday    . Polyethyl Glyc-Propyl Glyc PF (SYSTANE ULTRA PF) 0.4-0.3 % SOLN Apply 1 drop to eye as needed (both eye for dry eyes).    . Propylene Glycol-Glycerin 0.6-0.6 % SOLN Place 1-2 drops into both eyes 5 (five) times daily as needed (for dry eyes.). SOOTHE XP    . spironolactone (ALDACTONE) 50 MG tablet Take 25 mg by mouth daily.     . traMADol (ULTRAM) 50 MG tablet Take 25-50 mg by mouth 2 (two) times daily as needed for moderate pain.    . vitamin B-12 (CYANOCOBALAMIN) 1000 MCG tablet Take 1,000 mcg by mouth daily.    . vitamin E 1000 UNIT capsule Take 1,000 Units by mouth daily.    Marland Kitchen zolpidem (AMBIEN) 10 MG tablet Take 10 mg by mouth at bedtime as needed for sleep.     No current facility-administered medications for this visit.    PHYSICAL EXAMINATION: ECOG PERFORMANCE STATUS: 1 - Symptomatic but completely ambulatory  BP 119/84 (BP Location: Right Arm, Patient Position: Sitting)   Pulse (!) 56   Temp 97.7 F (36.5 C) (Tympanic)   Wt 122 lb 6.4 oz (55.5 kg)   SpO2 100%   BMI 20.37 kg/m   Filed Weights   04/30/20 0948  Weight: 122 lb 6.4 oz (55.5 kg)    Physical Exam HENT:     Head: Normocephalic and atraumatic.     Mouth/Throat:     Pharynx: No oropharyngeal exudate.  Eyes:     Pupils: Pupils are equal, round, and reactive to light.  Cardiovascular:     Rate and Rhythm: Normal rate and regular rhythm.  Pulmonary:     Effort: No respiratory distress.     Breath sounds: No wheezing.  Abdominal:     General: Bowel sounds are normal. There is no distension.     Palpations: Abdomen is soft. There is no mass.     Tenderness: There is no abdominal tenderness. There is no guarding or rebound.  Musculoskeletal:        General: No tenderness. Normal range of  motion.  Cervical back: Normal range of motion and neck supple.  Skin:    General: Skin is warm.     Comments:    Neurological:     Mental Status: She is alert and oriented to person, place, and time.  Psychiatric:        Mood and Affect: Affect normal.        LABORATORY DATA:  I have reviewed the data as listed    Component Value Date/Time   NA 137 04/30/2020 0936   NA 136 02/06/2012 0957   K 4.0 04/30/2020 0936   K 3.8 02/06/2012 0957   CL 103 04/30/2020 0936   CO2 25 04/30/2020 0936   GLUCOSE 99 04/30/2020 0936   BUN 10 04/30/2020 0936   CREATININE 0.77 04/30/2020 0936   CREATININE 0.97 11/12/2013 1508   CALCIUM 9.0 04/30/2020 0936   CALCIUM 9.5 05/08/2012 1018   PROT 7.2 04/30/2020 0936   PROT 7.6 11/12/2013 1508   ALBUMIN 3.9 04/30/2020 0936   ALBUMIN 3.7 11/12/2013 1508   AST 18 04/30/2020 0936   AST 10 (L) 11/12/2013 1508   ALT 10 04/30/2020 0936   ALT 15 11/12/2013 1508   ALKPHOS 47 04/30/2020 0936   ALKPHOS 49 11/12/2013 1508   BILITOT 0.3 04/30/2020 0936   BILITOT 0.7 11/12/2013 1508   GFRNONAA >60 04/30/2020 0936   GFRNONAA >60 11/12/2013 1508   GFRAA >60 03/06/2018 1302   GFRAA >60 11/12/2013 1508    No results found for: SPEP, UPEP  Lab Results  Component Value Date   WBC 6.6 04/30/2020   NEUTROABS 2.8 04/30/2020   HGB 14.2 04/30/2020   HCT 40.7 04/30/2020   MCV 91.9 04/30/2020   PLT 233 04/30/2020      Chemistry      Component Value Date/Time   NA 137 04/30/2020 0936   NA 136 02/06/2012 0957   K 4.0 04/30/2020 0936   K 3.8 02/06/2012 0957   CL 103 04/30/2020 0936   CO2 25 04/30/2020 0936   BUN 10 04/30/2020 0936   CREATININE 0.77 04/30/2020 0936   CREATININE 0.97 11/12/2013 1508      Component Value Date/Time   CALCIUM 9.0 04/30/2020 0936   CALCIUM 9.5 05/08/2012 1018   ALKPHOS 47 04/30/2020 0936   ALKPHOS 49 11/12/2013 1508   AST 18 04/30/2020 0936   AST 10 (L) 11/12/2013 1508   ALT 10 04/30/2020 0936   ALT 15  11/12/2013 1508   BILITOT 0.3 04/30/2020 0936   BILITOT 0.7 11/12/2013 1508       RADIOGRAPHIC STUDIES: I have personally reviewed the radiological images as listed and agreed with the findings in the report. No results found.   ASSESSMENT & PLAN:  Primary cancer of left upper lobe of lung ([place]) #Left upper lobe adenocarcinoma stage I; June 2021- CT chest NED; STABLE; will order CT scan today/in 6 months.    # Post thoracotomy pain--STABLE; currently on tramadol; increased neurontin 200 mg in AM; 200 mg in PM;   # History of breast cancer stage I; DEC 2021- Mammo-WNL- STABLE.   # BMD- April 2019- Osteoporosis- ca+vit D BID; On reclast every 12 months; awaiting dental extraction-PCP-STABLE.   # DISPOSITION: # Follow-up in 6 months-MD; labs- cbc/cmp; CT chest prior-- Dr.   Kayleen Memos Placed This Encounter  Procedures  . CT Chest W Contrast    Standing Status:   Future    Standing Expiration Date:   04/30/2021    Order Specific Question:  If indicated for the ordered procedure, I authorize the administration of contrast media per Radiology protocol    Answer:   Yes    Order Specific Question:   Preferred imaging location?    Answer:   [facility]   All questions were answered. The patient knows to call the clinic with any problems, questions or concerns.      Cammie Sickle, MD 04/30/2020 10:40 AM

## 2020-09-24 ENCOUNTER — Other Ambulatory Visit
Admission: RE | Admit: 2020-09-24 | Discharge: 2020-09-24 | Disposition: A | Discharge status: Home / Self Care | Payer: Medicare Other | Attending: Physician Assistant | Admitting: Physician Assistant

## 2020-09-24 ENCOUNTER — Other Ambulatory Visit: Payer: Self-pay

## 2020-09-24 ENCOUNTER — Other Ambulatory Visit (INDEPENDENT_AMBULATORY_CARE_PROVIDER_SITE_OTHER): Payer: Medicare Other

## 2020-09-24 DIAGNOSIS — I700 Atherosclerosis of aorta: Secondary | ICD-10-CM | POA: Diagnosis not present

## 2020-09-24 DIAGNOSIS — E782 Mixed hyperlipidemia: Secondary | ICD-10-CM

## 2020-09-25 LAB — HEPATIC FUNCTION PANEL
ALT: 9 IU/L (ref 0–32)
AST: 12 IU/L (ref 0–40)
Albumin: 4.4 g/dL (ref 3.8–4.8)
Alkaline Phosphatase: 53 IU/L (ref 44–121)
Bilirubin Total: 0.8 mg/dL (ref 0.0–1.2)
Bilirubin, Direct: 0.19 mg/dL (ref 0.00–0.40)
Total Protein: 7.2 g/dL (ref 6.0–8.5)

## 2020-09-25 LAB — LIPID PANEL
Chol/HDL Ratio: 2.5 ratio (ref 0.0–4.4)
Cholesterol, Total: 185 mg/dL (ref 100–199)
HDL: 74 mg/dL (ref >39)
LDL Chol Calc (NIH): 97 mg/dL (ref 0–99)
Triglycerides: 79 mg/dL (ref 0–149)
VLDL Cholesterol Cal: 14 mg/dL (ref 5–40)

## 2020-09-25 NOTE — Progress Notes (Signed)
Cardiology Office Note  Date:  09/27/2020   ID:  Katie Franklin, DOB 1956/01/17, MRN [id number]  PCP:  [name], MD   Chief Complaint  Patient presents with   Other    6 month f/u no complaints today. Meds reviewed verbally with pt.    HPI:  Katie Franklin is a 65 y.o. female.   Active smoker COPD: Moderate centrilobular and mild paraseptal emphysema on CT scan left-sided breast cancer treated with lumpectomy and radiation therapy.   Laparoscopic Cholecystectomy.  Who presents for follow-up of CAD on CT scan/Pet scan  LOV with myself 03/2019  Last CT scan 08/2019 Images reviewed Significant coronary calcification noted, aortic atherosclerosis  Reports having chronic Leg pain, with walking gets worse Leg cramps at night when stretching Reports it started before livalo Only takes livalo 3 times a week, costing her $200  Reports that she stopped smoking  Discussed prior use of statins Rash on crestor Zetia prescribed last visit 2020, does not remember if she ever tried it  Cholesterol numbers above goal  EKG personally reviewed by myself on todays visit Shows NSr rate 58 bpm, no ST or T wave changes  Other past medical history reviewed pulmonary function studies  FEV1 of approximately 70% and a DLCO of approximately 80%.     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 pylori infection, H/O herpes labialis, Hemorrhoids, History of chicken pox, Hyperplastic colon polyp, Hypertension, Lung cancer ([place]) (08/2017), Multinodular goiter (nontoxic), Osteoporosis, Panic disorder, Personal history of  radiation therapy, Recurrent sinusitis, Recurrent sinusitis, Tobacco use, and Tubular adenoma of colon.  PSH:    Past Surgical History:  Procedure Laterality Date   BACK SURGERY  2018   lumbar   BREAST EXCISIONAL BIOPSY Left    negative 1980's twice   BREAST EXCISIONAL BIOPSY Left 2012   [facility]   BREAST LUMPECTOMY Left 2012   [facility], clear margins, LN neg   BUNIONECTOMY Right    CHOLECYSTECTOMY N/A 06/11/2017   Procedure: LAPAROSCOPIC CHOLECYSTECTOMY;  Surgeon: Herbert Pun, MD;  Location: [facility] ORS;  Service: General;  Laterality: N/A;   COLONOSCOPY WITH PROPOFOL N/A 12/28/2014   Procedure: COLONOSCOPY WITH PROPOFOL;  Surgeon: Lollie Sails, MD;  Location: [facility] ENDOSCOPY;  Service: Endoscopy;  Laterality: N/A;   COLONOSCOPY WITH PROPOFOL N/A 08/17/2017   Procedure: COLONOSCOPY WITH PROPOFOL;  Surgeon: Lollie Sails, MD;  Location: [facility] END[facility]COPY;  Service: Endoscopy;  Laterality: N/A;   cyst throat     base of tongue   ESOPHAGOGASTRODUODENOSCOPY     ESOPHAGOGASTRODUODENOSCOPY N/A 04/13/2020   Procedure: ESOPHAGOGASTRODUODENOSCOPY (EGD);  Surgeon: Lesly Rubenstein, MD;  Location: [facility] ENDOSCOPY;  Service: Endoscopy;  Laterality: N/A;   ESOPHAGOGASTRODUODENOSCOPY (EGD) WITH PROPOFOL N/A 12/02/2015   Procedure: ESOPHAGOGASTRODUODENOSCOPY (EGD) WITH PROPOFOL;  Surgeon: Lollie Sails, MD;  Location: [facility] ENDOSCOPY;  Service: Endoscopy;  Laterality: N/A;   ESOPHAGOGASTRODUODENOSCOPY (EGD) WITH PROPOFOL N/A 04/13/2017   Procedure: ESOPHAGOGASTRODUODENOSCOPY (EGD) WITH PROPOFOL;  Surgeon: Lollie Sails, MD;  Location: [facility] ENDOSCOPY;  Service: Endoscopy;  Laterality: N/A;   EYE SURGERY  08/27/2019   cataract extraction right   EYE  SURGERY  09/10/2019   cataract extraction left   FLEXIBLE BRONCHOSCOPY N/A 08/27/2017   Procedure: PREOP BRONCHOSCOPY;  Surgeon: Nestor Lewandowsky, MD;  Location: [facility] ORS;  Service: Thoracic;  Laterality: N/A;   GANGLION CYST EXCISION Right     wrist   Incision tendon sheath for trigger finger Left    LUMBAR LAMINECTOMY/DECOMPRESSION MICRODISCECTOMY Right 11/06/2016   Procedure: RIGHT L5/S1 HEMILAMINECTOMY AND CYST RESECTION L5/S1;  Surgeon: Deetta Perla, MD;  Location: [facility] ORS;  Service: Neurosurgery;  Laterality: Right;   MASTECTOMY  07/2010   partial mastectomy left   MULTIPLE TOOTH EXTRACTIONS N/A    9 teeth   NASAL SEPTUM SURGERY     THORACOTOMY Left 08/27/2017   Procedure: THORACOTOMY MAJOR;  Surgeon: Nestor Lewandowsky, MD;  Location: [facility] ORS;  Service: Thoracic;  Laterality: Left;   TONSILLECTOMY     TRIGGER FINGER RELEASE Left     Current Outpatient Medications  Medication Sig Dispense Refill   acetaminophen (TYLENOL) 500 MG tablet Take 500-1,000 mg by mouth 2 (two) times daily as needed for moderate pain (FOR HEADACHES/BACK PAIN.).      albuterol (PROVENTIL HFA;VENTOLIN HFA) 108 (90 BASE) MCG/ACT inhaler Inhale 2 puffs into the lungs every 6 (six) hours as needed for wheezing or shortness of breath.      ALPRAZolam (XANAX) 0.25 MG tablet Take 0.25 mg by mouth 3 (three) times daily as needed for anxiety.      atenolol (TENORMIN) 50 MG tablet Take 50 mg by mouth daily.     Calcium Carbonate-Vitamin D (CALTRATE 600+D PO) Take 1 tablet by mouth daily. Chewable     denosumab (PROLIA) 60 MG/ML SOSY injection Inject 60 mg into the skin every 6 (six) months.     eletriptan (RELPAX) 40 MG tablet Take 40 mg by mouth as needed for migraine or headache. May repeat in 2 hours if headache persists or recurs.     gabapentin (NEURONTIN) 100 MG capsule TAKE 1 CAPSULE EVERY       MORNING AND 2 CAPSULES     EVERY EVENING 90 capsule 4   hydroxypropyl methylcellulose / hypromellose (ISOPTO TEARS / GONIOVISC) 2.5 % ophthalmic solution Place 1 drop into both eyes as needed for dry eyes.     Lifitegrast 5 % SOLN Place 1 drop into both eyes 2 (two) times daily as needed (for excessively dry eyes.).     pantoprazole (PROTONIX) 40 MG tablet Take  40 mg by mouth daily.     Pitavastatin Calcium (LIVALO) 2 MG TABS Take 1 tablet by mouth 3 (three) times a week. Monday, Wednesday,Friday     Polyethyl Glyc-Propyl Glyc PF (SYSTANE ULTRA PF) 0.4-0.3 % SOLN Apply 1 drop to eye as needed (both eye for dry eyes).     Propylene Glycol-Glycerin 0.6-0.6 % SOLN Place 1-2 drops into both eyes 5 (five) times daily as needed (for dry eyes.). SOOTHE XP     spironolactone (ALDACTONE) 50 MG tablet Take 25 mg by mouth daily.      traMADol (ULTRAM) 50 MG tablet Take 25-50 mg by mouth 2 (two) times daily as needed for moderate pain.     vitamin B-12 (CYANOCOBALAMIN) 1000 MCG tablet Take 1,000 mcg by mouth daily.     vitamin E 1000 UNIT capsule Take 1,000 Units by mouth daily.     zolpidem (AMBIEN) 10 MG tablet Take 10 mg by mouth at bedtime as needed for sleep.     No current facility-administered medications for this visit.  Allergies:   Erythromycin, Septra ds [sulfamethoxazole-trimethoprim], Sulfa antibiotics, Sulfasalazine, Tetracyclines & related, Aspirin, Demeclocycline, and Latex   Social History:  The patient  reports that she quit smoking about 3 years ago. Her smoking use included cigarettes. She has a 45.00 pack-year smoking history. She has never used smokeless tobacco. She reports that she does not drink alcohol and does not use drugs.   Family History:   family history includes Alzheimer's disease in her mother; Breast cancer (age of onset: 17) in her maternal aunt; Colon polyps in her mother; Depression in her brother; Heart disease in her father and mother; Hypertension in her father and mother; Prostate cancer in her father.    Review of Systems: Review of Systems  Constitutional: Negative.   HENT: Negative.    Respiratory: Negative.    Cardiovascular: Negative.   Gastrointestinal: Negative.   Neurological: Negative.   Psychiatric/Behavioral: Negative.    All other systems reviewed and are negative.   PHYSICAL EXAM: VS:  BP  104/70 (BP Location: Right Arm, Patient Position: Sitting, Cuff Size: Normal)   Pulse (!) 58   Ht _0  (1.651 m)   Wt 120 lb 6 oz (54.6 kg)   SpO2 98%   BMI 20.03 kg/m  , BMI Body mass index is 20.03 kg/m. Constitutional:  oriented to person, place, and time. No distress.  HENT:  Head: Grossly normal Eyes:  no discharge. No scleral icterus.  Neck: No JVD, no carotid bruits  Cardiovascular: Regular rate and rhythm, no murmurs appreciated Pulmonary/Chest: Clear to auscultation bilaterally, no wheezes or rails Abdominal: Soft.  no distension.  no tenderness.  Musculoskeletal: Normal range of motion Neurological:  normal muscle tone. Coordination normal. No atrophy Skin: Skin warm and dry Psychiatric: normal affect, pleasant   Recent Labs: 04/30/2020: BUN 10; Creatinine, Ser 0.77; Hemoglobin 14.2; Platelets 233; Potassium 4.0; Sodium 137 09/24/2020: ALT 9    Lipid Panel Lab Results  Component Value Date   CHOL 185 09/24/2020   HDL 74 09/24/2020   LDLCALC 97 09/24/2020   TRIG 79 09/24/2020      Wt Readings from Last 3 Encounters:  09/27/20 120 lb 6 oz (54.6 kg)  04/30/20 122 lb 6.4 oz (55.5 kg)  04/13/20 120 lb 3.8 oz (54.5 kg)      ASSESSMENT AND PLAN:  Aortic atherosclerosis (HCC) Recommend she try Zetia 10 mg daily to achieve goal LDL less than 70 When she runs out of Livalo,.  We will stop given it is $200 Suggested she call us, potentially could try low-dose alternate statin  Centrilobular emphysema (HCC) Smoking cessation recommended Chronic mild shortness of breath on exertion History of lobectomy  Coronary artery disease involving native coronary artery of native heart without angina pectoris -  Currently with no symptoms of angina. No further workup at this time. Continue current medication regimen.  Smoker We have encouraged her to continue to work on weaning her cigarettes and smoking cessation. She will continue to work on this and does not want any  assistance with chantix.    Essential hypertension - Plan: EKG 12-Lead Blood pressure is well controlled on today's visit. No changes made to the medications.  History of breast cancer Prior radiation on the left  Stressed importance of aggressive lipid control, smoking cessation  Leg pain Unable to exclude PAD and claudication Lower extremity arterial Doppler ordered   Total encounter time more than 25 minutes  Greater than 50% was spent in counseling and coordination of care with the  patient    No orders of the defined types were placed in this encounter.    Signed, Esmond Plants, M.D., Ph.D. 09/27/2020  [place], [place]

## 2020-09-27 ENCOUNTER — Ambulatory Visit (INDEPENDENT_AMBULATORY_CARE_PROVIDER_SITE_OTHER): Payer: Medicare Other | Admitting: Cardiovascular Disease

## 2020-09-27 ENCOUNTER — Other Ambulatory Visit: Payer: Self-pay

## 2020-09-27 ENCOUNTER — Encounter: Payer: Self-pay | Admitting: Cardiovascular Disease

## 2020-09-27 VITALS — BP 104/70 | HR 58 | Ht 65.0 in | Wt 120.4 lb

## 2020-09-27 DIAGNOSIS — E782 Mixed hyperlipidemia: Secondary | ICD-10-CM

## 2020-09-27 DIAGNOSIS — I10 Essential (primary) hypertension: Secondary | ICD-10-CM

## 2020-09-27 DIAGNOSIS — I700 Atherosclerosis of aorta: Secondary | ICD-10-CM | POA: Diagnosis not present

## 2020-09-27 DIAGNOSIS — I2510 Atherosclerotic heart disease of native coronary artery without angina pectoris: Secondary | ICD-10-CM

## 2020-09-27 DIAGNOSIS — J432 Centrilobular emphysema: Secondary | ICD-10-CM

## 2020-09-27 DIAGNOSIS — Z87891 Personal history of nicotine dependence: Secondary | ICD-10-CM

## 2020-09-27 DIAGNOSIS — R002 Palpitations: Secondary | ICD-10-CM

## 2020-09-27 DIAGNOSIS — I739 Peripheral vascular disease, unspecified: Secondary | ICD-10-CM

## 2020-09-27 MED ORDER — EZETIMIBE 10 MG PO TABS: 10.0000 mg | ORAL_TABLET | Freq: Every day | ORAL | 3 refills | Status: DC

## 2020-09-27 NOTE — Patient Instructions (Addendum)
Medication Instructions:  Start zetia 10 mg daily for cholesterol   If you need a refill on your cardiac medications before your next appointment, please call your pharmacy.    Lab work: No new labs needed  Testing/Procedures: Lower extremity arterial doppler ultrasound We will call with results, report will also be on MyChart   Follow-Up:  You will need a follow up appointment in 12 months  Providers on your designated Care Team:   Murray Hodgkins, NP Christell Faith, PA-C Marrianne Mood, PA-C Cadence [place], [place]  COVID-19 Vaccine Information can be found at: [email or web address].uk For questions related to vaccine distribution or appointments, please email [email or web address] or call [phone].

## 2020-09-28 ENCOUNTER — Other Ambulatory Visit: Payer: Self-pay | Admitting: Cardiovascular Disease

## 2020-09-28 ENCOUNTER — Ambulatory Visit (INDEPENDENT_AMBULATORY_CARE_PROVIDER_SITE_OTHER): Payer: Medicare Other

## 2020-09-28 DIAGNOSIS — I2510 Atherosclerotic heart disease of native coronary artery without angina pectoris: Secondary | ICD-10-CM

## 2020-09-28 DIAGNOSIS — I739 Peripheral vascular disease, unspecified: Secondary | ICD-10-CM

## 2020-10-12 ENCOUNTER — Telehealth: Payer: Self-pay

## 2020-10-12 NOTE — Telephone Encounter (Signed)
Left detail message on VM of pt's recent results okay by DPR, Dr. Rockey Situ advised   "Lower extremity arterial duplex  Normal study  No indication of stenosis"  At this time, no further recommendations or medications changes, advised to call office for any concerns or questions, otherwise will see at next visit.   Results also uploaded to [facility] for review as well.

## 2020-10-29 ENCOUNTER — Ambulatory Visit
Admission: RE | Admit: 2020-10-29 | Discharge: 2020-10-29 | Disposition: A | Discharge status: Home / Self Care | Payer: Medicare Other | Source: Ambulatory Visit | Attending: Internal Medicine | Admitting: Internal Medicine

## 2020-10-29 ENCOUNTER — Other Ambulatory Visit: Payer: Self-pay

## 2020-10-29 DIAGNOSIS — C3412 Malignant neoplasm of upper lobe, left bronchus or lung: Secondary | ICD-10-CM | POA: Insufficient documentation

## 2020-10-29 LAB — POCT I-STAT CREATININE: Creatinine, Ser: 0.8 mg/dL (ref 0.44–1.00)

## 2020-10-29 MED ORDER — IOHEXOL 300 MG/ML  SOLN
75.0000 mL | Freq: Once | INTRAMUSCULAR | Status: AC | PRN
  Administered 2020-10-29: 75 mL via INTRAVENOUS

## 2020-11-01 ENCOUNTER — Other Ambulatory Visit: Payer: Self-pay

## 2020-11-01 DIAGNOSIS — C3412 Malignant neoplasm of upper lobe, left bronchus or lung: Secondary | ICD-10-CM

## 2020-11-02 ENCOUNTER — Other Ambulatory Visit: Payer: Self-pay

## 2020-11-02 ENCOUNTER — Inpatient Hospital Stay: Payer: Medicare Other

## 2020-11-02 ENCOUNTER — Inpatient Hospital Stay: Payer: Medicare Other | Attending: Oncology | Admitting: Oncology

## 2020-11-02 VITALS — BP 125/77 | HR 55 | Temp 98.7°F | Resp 18 | Wt 120.0 lb

## 2020-11-02 DIAGNOSIS — Z853 Personal history of malignant neoplasm of breast: Secondary | ICD-10-CM | POA: Insufficient documentation

## 2020-11-02 DIAGNOSIS — C3412 Malignant neoplasm of upper lobe, left bronchus or lung: Secondary | ICD-10-CM

## 2020-11-02 DIAGNOSIS — M810 Age-related osteoporosis without current pathological fracture: Secondary | ICD-10-CM | POA: Diagnosis not present

## 2020-11-02 DIAGNOSIS — Z170 Estrogen receptor positive status [ER+]: Secondary | ICD-10-CM

## 2020-11-02 DIAGNOSIS — C50812 Malignant neoplasm of overlapping sites of left female breast: Secondary | ICD-10-CM

## 2020-11-02 LAB — CBC
HCT: 39.7 % (ref 36.0–46.0)
Hemoglobin: 13.3 g/dL (ref 12.0–15.0)
MCH: 31.3 pg (ref 26.0–34.0)
MCHC: 33.5 g/dL (ref 30.0–36.0)
MCV: 93.4 fL (ref 80.0–100.0)
Platelets: 205 10*3/uL (ref 150–400)
RBC: 4.25 MIL/uL (ref 3.87–5.11)
RDW: 12.7 % (ref 11.5–15.5)
WBC: 7.6 10*3/uL (ref 4.0–10.5)
nRBC: 0 % (ref 0.0–0.2)

## 2020-11-02 LAB — COMPREHENSIVE METABOLIC PANEL
ALT: 10 U/L (ref 0–44)
AST: 14 U/L — ABNORMAL LOW (ref 15–41)
Albumin: 3.7 g/dL (ref 3.5–5.0)
Alkaline Phosphatase: 39 U/L (ref 38–126)
Anion gap: 8 (ref 5–15)
BUN: 9 mg/dL (ref 8–23)
CO2: 25 mmol/L (ref 22–32)
Calcium: 9.1 mg/dL (ref 8.9–10.3)
Chloride: 103 mmol/L (ref 98–111)
Creatinine, Ser: 0.87 mg/dL (ref 0.44–1.00)
GFR, Estimated: >60 mL/min (ref >60)
Glucose, Bld: 96 mg/dL (ref 70–99)
Potassium: 4.1 mmol/L (ref 3.5–5.1)
Sodium: 136 mmol/L (ref 135–145)
Total Bilirubin: 1 mg/dL (ref 0.3–1.2)
Total Protein: 6.9 g/dL (ref 6.5–8.1)

## 2020-11-02 NOTE — Progress Notes (Signed)
[place] [facility] OFFICE PROGRESS NOTE  Patient Care Team: [name], MD as PCP - General (Infectious Diseases)  Cancer Staging No matching staging information was found for the patient.   Oncology History Overview Note  # 2012- LEFT BREAST [pt1b- STAGE I; G-2];ER/PR-Pos; her 2 Neu-NEG;  RS- 21 [risk of recurrence 13%];s/p Lumpec& SLNBx; NO chemo; s/p RT; On Aromasin  [finish April 2017]  # MAY 2019- ADENO CA LUL STAGE IA [s/p wedge resection; Dr.Oaks; [facility]]  # Osteoporosis- stopped PO bisphosphonate sec to GI issues; On reclast every 12 months; defer to PCP.  Chronic back pain-Dr.Chasnis   DIAGNOSIS: _0  Breast ca Stage I ER/PR pos; Her 2NEG; LUL adeno stage I  GOALS: curative  CURRENT/MOST RECENT THERAPY- surveillance    Carcinoma of overlapping sites of left breast in female, estrogen receptor positive ([place])  Primary cancer of left upper lobe of lung ([place])    INTERVAL HISTORY:  Katie Franklin 65 y.o.  female pleasant patient above history of breast cancer; and also left upper lobe lung cancer is here for a follow-up.   She was last seen on 04/30/20 by Dr. Rogue Bussing.  They reviewed her mammogram from 03/24/2020 which was reported as BI-RADS Category 1 negative.  She had a CT scan on 10/29/2020 showed no evidence of local tumor recurrence status post wide resection in the left upper lobe.  No evidence of metastatic disease in the chest.  In the interim, she has done well.  She continues to have left-sided breast and rib pain secondary to lumpectomy and wedge resection.  She takes her gabapentin and tramadol as needed.  States it is just sore.  Reports she is doing good and denies any new symptoms.  Review of Systems  Constitutional: Negative.  Negative for chills, fever, malaise/fatigue and weight loss.  HENT:  Negative for congestion, ear pain and tinnitus.   Eyes: Negative.  Negative for blurred vision and double vision.  Respiratory: Negative.  Negative  for cough, sputum production and shortness of breath.   Cardiovascular: Negative.  Negative for chest pain, palpitations and leg swelling.  Gastrointestinal: Negative.  Negative for abdominal pain, constipation, diarrhea, nausea and vomiting.  Genitourinary:  Negative for dysuria, frequency and urgency.  Musculoskeletal:  Negative for back pain and falls.       Left rib pain and breast pain-chronic  Skin: Negative.  Negative for rash.  Neurological: Negative.  Negative for weakness and headaches.  Endo/Heme/Allergies: Negative.  Does not bruise/bleed easily.  Psychiatric/Behavioral: Negative.  Negative for depression. The patient is not nervous/anxious and does not have insomnia.      PAST MEDICAL HISTORY :  Past Medical History:  Diagnosis Date   Breast cancer ([place]) 2012   left breast   COPD (chronic obstructive pulmonary disease) ([place])    Deafness in left ear    Depression    Diverticulosis    Ductal carcinoma of left breast ([place]) 2012   Family history of adverse reaction to anesthesia    mother got sick   Gastritis    GERD (gastroesophageal reflux disease)    H. pylori infection    H/O herpes labialis    Hemorrhoids    History of chicken pox    Hyperplastic colon polyp    Hypertension    Lung cancer ([place]) 08/2017   surgery only, no rad or chemo tx   Multinodular goiter (nontoxic)    Osteoporosis    Panic disorder    Personal history of  radiation therapy    Recurrent sinusitis    Recurrent sinusitis    Tobacco use    Tubular adenoma of colon     PAST SURGICAL HISTORY :   Past Surgical History:  Procedure Laterality Date   BACK SURGERY  2018   lumbar   BREAST EXCISIONAL BIOPSY Left    negative 1980's twice   BREAST EXCISIONAL BIOPSY Left 2012   [facility]   BREAST LUMPECTOMY Left 2012   [facility], clear margins, LN neg   BUNIONECTOMY Right    CHOLECYSTECTOMY N/A 06/11/2017   Procedure: LAPAROSCOPIC CHOLECYSTECTOMY;  Surgeon: Herbert Pun, MD;  Location: [facility] ORS;   Service: General;  Laterality: N/A;   COLONOSCOPY WITH PROPOFOL N/A 12/28/2014   Procedure: COLONOSCOPY WITH PROPOFOL;  Surgeon: Lollie Sails, MD;  Location: [facility] ENDOSCOPY;  Service: Endoscopy;  Laterality: N/A;   COLONOSCOPY WITH PROPOFOL N/A 08/17/2017   Procedure: COLONOSCOPY WITH PROPOFOL;  Surgeon: Lollie Sails, MD;  Location: [facility] ENDOSCOPY;  Service: Endoscopy;  Laterality: N/A;   cyst throat     base of tongue   ESOPHAGOGASTRODUODENOSCOPY     ESOPHAGOGASTRODUODENOSCOPY N/A 04/13/2020   Procedure: ESOPHAGOGASTRODUODENOSCOPY (EGD);  Surgeon: Lesly Rubenstein, MD;  Location[facility] ENDOSCOPY;  Service: Endoscopy;  Laterality: N/A;   ESOPHAGOGASTRODUODENOSCOPY (EGD) WITH PROPOFOL N/A 12/02/2015   Procedure: ESOPHAGOGASTRODUODENOSCOPY (EGD) WITH PROPOFOL;  Surgeon: Lollie Sails, MD;  Location: [facility] ENDOSCOPY;  Service: Endoscopy;  Laterality: N/A;   ESOPHAGOGASTRODUODENOSCOPY (EGD) WITH PROPOFOL N/A 04/13/2017   Procedure: ESOPHAGOGASTRODUODENOSCOPY (EGD) WITH PROPOFOL;  Surgeon: Lollie Sails, MD;  Location: [facility] ENDOSCOPY;  Service: Endoscopy;  Laterality: N/A;   EYE SURGERY  08/27/2019   cataract extraction right   EYE SURGERY  09/10/2019   cataract extraction left   FLEXIBLE BRONCHOSCOPY N/A 08/27/2017   Procedure: PREOP BRONCHOSCOPY;  Surgeon: Nestor Lewandowsky, MD;  Location: [facility] ORS;  Service: Thoracic;  Laterality: N/A;   GANGLION CYST EXCISION Right    wrist   Incision tendon sheath for trigger finger Left    LUMBAR LAMINECTOMY/DECOMPRESSION MICRODISCECTOMY Right 11/06/2016   Procedure: RIGHT L5/S1 HEMILAMINECTOMY AND CYST RESECTION L5/S1;  Surgeon: Deetta Perla, MD;  Location: [facility] ORS;  Service: Neurosurgery;  Laterality: Right;   MASTECTOMY  07/2010   partial mastectomy left   MULTIPLE TOOTH EXTRACTIONS N/A    9 teeth   NASAL SEPTUM SURGERY     THORACOTOMY Left 08/27/2017   Procedure: THORACOTOMY MAJOR;  Surgeon: Nestor Lewandowsky, MD;  Location: [facility]  ORS;  Service: Thoracic;  Laterality: Left;   TONSILLECTOMY     TRIGGER FINGER RELEASE Left     FAMILY HISTORY :   Family History  Problem Relation Age of Onset   Breast cancer Maternal Aunt 8       great aunt   Prostate cancer Father    Hypertension Father    Heart disease Father    Alzheimer's disease Mother    Heart disease Mother    Hypertension Mother    Colon polyps Mother    Depression Brother        brother #2    SOCIAL HISTORY:   Social History   Tobacco Use   Smoking status: Former    Packs/day: 1.00    Years: 45.00    Pack years: 45.00    Types: Cigarettes    Quit date: 07/26/2017    Years since quitting: 3.2   Smokeless tobacco: Never  Vaping Use   Vaping Use: Never used  Substance Use Topics  Alcohol use: No    Alcohol/week: 0.0 standard drinks   Drug use: No    ALLERGIES:  is allergic to erythromycin, septra ds [sulfamethoxazole-trimethoprim], sulfa antibiotics, sulfasalazine, tetracyclines & related, aspirin, demeclocycline, and latex.  MEDICATIONS:  Current Outpatient Medications  Medication Sig Dispense Refill   acetaminophen (TYLENOL) 500 MG tablet Take 500-1,000 mg by mouth 2 (two) times daily as needed for moderate pain (FOR HEADACHES/BACK PAIN.).      albuterol (PROVENTIL HFA;VENTOLIN HFA) 108 (90 BASE) MCG/ACT inhaler Inhale 2 puffs into the lungs every 6 (six) hours as needed for wheezing or shortness of breath.      ALPRAZolam (XANAX) 0.25 MG tablet Take 0.25 mg by mouth 3 (three) times daily as needed for anxiety.      atenolol (TENORMIN) 50 MG tablet Take 50 mg by mouth daily.     Calcium Carbonate-Vitamin D (CALTRATE 600+D PO) Take 1 tablet by mouth daily. Chewable     denosumab (PROLIA) 60 MG/ML SOSY injection Inject 60 mg into the skin every 6 (six) months.     eletriptan (RELPAX) 40 MG tablet Take 40 mg by mouth as needed for migraine or headache. May repeat in 2 hours if headache persists or recurs.     ezetimibe (ZETIA) 10 MG  tablet Take 1 tablet (10 mg total) by mouth daily. 90 tablet 3   gabapentin (NEURONTIN) 100 MG capsule TAKE 1 CAPSULE EVERY       MORNING AND 2 CAPSULES     EVERY EVENING 90 capsule 4   hydroxypropyl methylcellulose / hypromellose (ISOPTO TEARS / GONIOVISC) 2.5 % ophthalmic solution Place 1 drop into both eyes as needed for dry eyes.     Lifitegrast 5 % SOLN Place 1 drop into both eyes 2 (two) times daily as needed (for excessively dry eyes.).     pantoprazole (PROTONIX) 40 MG tablet Take 40 mg by mouth daily.     Pitavastatin Calcium (LIVALO) 2 MG TABS Take 1 tablet by mouth 3 (three) times a week. Monday, Wednesday,Friday     Polyethyl Glyc-Propyl Glyc PF (SYSTANE ULTRA PF) 0.4-0.3 % SOLN Apply 1 drop to eye as needed (both eye for dry eyes).     Propylene Glycol-Glycerin 0.6-0.6 % SOLN Place 1-2 drops into both eyes 5 (five) times daily as needed (for dry eyes.). SOOTHE XP     spironolactone (ALDACTONE) 50 MG tablet Take 25 mg by mouth daily.      traMADol (ULTRAM) 50 MG tablet Take 25-50 mg by mouth 2 (two) times daily as needed for moderate pain.     vitamin B-12 (CYANOCOBALAMIN) 1000 MCG tablet Take 1,000 mcg by mouth daily.     vitamin E 1000 UNIT capsule Take 1,000 Units by mouth daily.     zolpidem (AMBIEN) 10 MG tablet Take 10 mg by mouth at bedtime as needed for sleep.     No current facility-administered medications for this visit.    PHYSICAL EXAMINATION: ECOG PERFORMANCE STATUS: 1 - Symptomatic but completely ambulatory  There were no vitals taken for this visit.  There were no vitals filed for this visit.   Physical Exam Constitutional:      Appearance: Normal appearance.  HENT:     Head: Normocephalic and atraumatic.  Eyes:     Pupils: Pupils are equal, round, and reactive to light.  Cardiovascular:     Rate and Rhythm: Normal rate and regular rhythm.     Heart sounds: Normal heart sounds. No murmur heard. Pulmonary:  Effort: Pulmonary effort is normal.      Breath sounds: Normal breath sounds. No wheezing.  Chest:     Chest wall: Tenderness present.  Breasts:    Left: Tenderness present.  Abdominal:     General: Bowel sounds are normal. There is no distension.     Palpations: Abdomen is soft.     Tenderness: There is no abdominal tenderness.  Musculoskeletal:        General: Normal range of motion.     Cervical back: Normal range of motion.  Skin:    General: Skin is warm and dry.     Findings: No rash.  Neurological:     Mental Status: She is alert and oriented to person, place, and time.     Gait: Gait is intact.  Psychiatric:        Mood and Affect: Mood and affect normal.        Cognition and Memory: Memory normal.        Judgment: Judgment normal.       LABORATORY DATA:  I have reviewed the data as listed    Component Value Date/Time   NA 136 11/02/2020 1409   NA 136 02/06/2012 0957   K 4.1 11/02/2020 1409   K 3.8 02/06/2012 0957   CL 103 11/02/2020 1409   CO2 25 11/02/2020 1409   GLUCOSE 96 11/02/2020 1409   BUN 9 11/02/2020 1409   CREATININE 0.87 11/02/2020 1409   CREATININE 0.97 11/12/2013 1508   CALCIUM 9.1 11/02/2020 1409   CALCIUM 9.5 05/08/2012 1018   PROT 6.9 11/02/2020 1409   PROT 7.2 09/24/2020 1159   PROT 7.6 11/12/2013 1508   ALBUMIN 3.7 11/02/2020 1409   ALBUMIN 4.4 09/24/2020 1159   ALBUMIN 3.7 11/12/2013 1508   AST 14 (L) 11/02/2020 1409   AST 10 (L) 11/12/2013 1508   ALT 10 11/02/2020 1409   ALT 15 11/12/2013 1508   ALKPHOS 39 11/02/2020 1409   ALKPHOS 49 11/12/2013 1508   BILITOT 1.0 11/02/2020 1409   BILITOT 0.8 09/24/2020 1159   BILITOT 0.7 11/12/2013 1508   GFRNONAA >60 11/02/2020 1409   GFRNONAA >60 11/12/2013 1508   GFRAA >60 03/06/2018 1302   GFRAA >60 11/12/2013 1508    No results found for: SPEP, UPEP  Lab Results  Component Value Date   WBC 7.6 11/02/2020   NEUTROABS 2.8 04/30/2020   HGB 13.3 11/02/2020   HCT 39.7 11/02/2020   MCV 93.4 11/02/2020   PLT 205 11/02/2020       Chemistry      Component Value Date/Time   NA 136 11/02/2020 1409   NA 136 02/06/2012 0957   K 4.1 11/02/2020 1409   K 3.8 02/06/2012 0957   CL 103 11/02/2020 1409   CO2 25 11/02/2020 1409   BUN 9 11/02/2020 1409   CREATININE 0.87 11/02/2020 1409   CREATININE 0.97 11/12/2013 1508      Component Value Date/Time   CALCIUM 9.1 11/02/2020 1409   CALCIUM 9.5 05/08/2012 1018   ALKPHOS 39 11/02/2020 1409   ALKPHOS 49 11/12/2013 1508   AST 14 (L) 11/02/2020 1409   AST 10 (L) 11/12/2013 1508   ALT 10 11/02/2020 1409   ALT 15 11/12/2013 1508   BILITOT 1.0 11/02/2020 1409   BILITOT 0.8 09/24/2020 1159   BILITOT 0.7 11/12/2013 1508       RADIOGRAPHIC STUDIES: I have personally reviewed the radiological images as listed and agreed with the findings in the report.  No results found.   ASSESSMENT & PLAN:   Left upper lobe adenocarcinoma stage I- Diagnosed in April 2019 Status post wedge resection in May 2019 Most recent CT chest from 10/29/2020 shows no evidence of disease. Repeat CT chest in 1 year.   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  Osteoporosis- On calcium and vit D BID On reclast annually Awaiting dental clearance    Disposition- RTC in 6 months for labs, MD and to review mammogram.   I spent 20 minutes dedicated to the care of this patient (face-to-face and non-face-to-face) on the date of the encounter to include what is described in the assessment and plan.  No problem-specific Assessment & Plan notes found for this encounter.   No orders of the defined types were placed in this encounter.  All questions were answered. The patient knows to call the clinic with any problems, questions or concerns.      Jacquelin Hawking, NP 11/02/2020 2:43 PM

## 2021-02-02 ENCOUNTER — Other Ambulatory Visit: Payer: Self-pay | Admitting: Infectious Diseases

## 2021-02-02 DIAGNOSIS — R1032 Left lower quadrant pain: Secondary | ICD-10-CM

## 2021-02-07 ENCOUNTER — Other Ambulatory Visit: Payer: Self-pay

## 2021-02-07 ENCOUNTER — Ambulatory Visit
Admission: RE | Admit: 2021-02-07 | Discharge: 2021-02-07 | Disposition: A | Discharge status: Home / Self Care | Payer: Medicare Other | Source: Ambulatory Visit | Attending: Infectious Diseases | Admitting: Infectious Diseases

## 2021-02-07 DIAGNOSIS — R1032 Left lower quadrant pain: Secondary | ICD-10-CM | POA: Diagnosis not present

## 2021-02-07 MED ORDER — IOHEXOL 300 MG/ML  SOLN
75.0000 mL | Freq: Once | INTRAMUSCULAR | Status: AC | PRN
  Administered 2021-02-07: 75 mL via INTRAVENOUS

## 2021-04-12 ENCOUNTER — Other Ambulatory Visit: Payer: Self-pay

## 2021-04-12 ENCOUNTER — Ambulatory Visit
Admission: RE | Admit: 2021-04-12 | Discharge: 2021-04-12 | Disposition: A | Discharge status: Home / Self Care | Payer: Medicare Other | Source: Ambulatory Visit | Attending: Oncology | Admitting: Oncology

## 2021-04-12 DIAGNOSIS — Z853 Personal history of malignant neoplasm of breast: Secondary | ICD-10-CM | POA: Insufficient documentation

## 2021-04-12 DIAGNOSIS — Z1231 Encounter for screening mammogram for malignant neoplasm of breast: Secondary | ICD-10-CM | POA: Insufficient documentation

## 2021-04-12 DIAGNOSIS — C50812 Malignant neoplasm of overlapping sites of left female breast: Secondary | ICD-10-CM

## 2021-04-27 ENCOUNTER — Other Ambulatory Visit: Payer: Self-pay | Admitting: *Deleted

## 2021-04-27 DIAGNOSIS — C50812 Malignant neoplasm of overlapping sites of left female breast: Secondary | ICD-10-CM

## 2021-05-06 ENCOUNTER — Inpatient Hospital Stay: Payer: Medicare Other | Attending: Internal Medicine

## 2021-05-06 ENCOUNTER — Other Ambulatory Visit: Payer: Self-pay

## 2021-05-06 ENCOUNTER — Inpatient Hospital Stay (HOSPITAL_BASED_OUTPATIENT_CLINIC_OR_DEPARTMENT_OTHER): Payer: Medicare Other | Admitting: Internal Medicine

## 2021-05-06 VITALS — BP 105/77 | HR 59 | Temp 97.8°F | Wt 113.8 lb

## 2021-05-06 DIAGNOSIS — R109 Unspecified abdominal pain: Secondary | ICD-10-CM | POA: Diagnosis not present

## 2021-05-06 DIAGNOSIS — Z85118 Personal history of other malignant neoplasm of bronchus and lung: Secondary | ICD-10-CM | POA: Insufficient documentation

## 2021-05-06 DIAGNOSIS — Z923 Personal history of irradiation: Secondary | ICD-10-CM | POA: Insufficient documentation

## 2021-05-06 DIAGNOSIS — C3412 Malignant neoplasm of upper lobe, left bronchus or lung: Secondary | ICD-10-CM

## 2021-05-06 DIAGNOSIS — Z853 Personal history of malignant neoplasm of breast: Secondary | ICD-10-CM | POA: Diagnosis not present

## 2021-05-06 DIAGNOSIS — G8922 Chronic post-thoracotomy pain: Secondary | ICD-10-CM | POA: Diagnosis not present

## 2021-05-06 DIAGNOSIS — Z79899 Other long term (current) drug therapy: Secondary | ICD-10-CM | POA: Insufficient documentation

## 2021-05-06 DIAGNOSIS — C50812 Malignant neoplasm of overlapping sites of left female breast: Secondary | ICD-10-CM

## 2021-05-06 LAB — CBC WITH DIFFERENTIAL/PLATELET
Abs Immature Granulocytes: 0 10*3/uL (ref 0.00–0.07)
Basophils Absolute: 0 10*3/uL (ref 0.0–0.1)
Basophils Relative: 1 %
Eosinophils Absolute: 0.1 10*3/uL (ref 0.0–0.5)
Eosinophils Relative: 1 %
HCT: 40.4 % (ref 36.0–46.0)
Hemoglobin: 13.6 g/dL (ref 12.0–15.0)
Immature Granulocytes: 0 %
Lymphocytes Relative: 45 %
Lymphs Abs: 2.4 10*3/uL (ref 0.7–4.0)
MCH: 31.5 pg (ref 26.0–34.0)
MCHC: 33.7 g/dL (ref 30.0–36.0)
MCV: 93.5 fL (ref 80.0–100.0)
Monocytes Absolute: 0.6 10*3/uL (ref 0.1–1.0)
Monocytes Relative: 11 %
Neutro Abs: 2.3 10*3/uL (ref 1.7–7.7)
Neutrophils Relative %: 42 %
Platelets: 231 10*3/uL (ref 150–400)
RBC: 4.32 MIL/uL (ref 3.87–5.11)
RDW: 12.9 % (ref 11.5–15.5)
WBC: 5.4 10*3/uL (ref 4.0–10.5)
nRBC: 0 % (ref 0.0–0.2)

## 2021-05-06 LAB — COMPREHENSIVE METABOLIC PANEL
ALT: 9 U/L (ref 0–44)
AST: 15 U/L (ref 15–41)
Albumin: 4 g/dL (ref 3.5–5.0)
Alkaline Phosphatase: 41 U/L (ref 38–126)
Anion gap: 6 (ref 5–15)
BUN: 10 mg/dL (ref 8–23)
CO2: 27 mmol/L (ref 22–32)
Calcium: 8.9 mg/dL (ref 8.9–10.3)
Chloride: 104 mmol/L (ref 98–111)
Creatinine, Ser: 0.75 mg/dL (ref 0.44–1.00)
GFR, Estimated: >60 mL/min (ref >60)
Glucose, Bld: 88 mg/dL (ref 70–99)
Potassium: 3.8 mmol/L (ref 3.5–5.1)
Sodium: 137 mmol/L (ref 135–145)
Total Bilirubin: 0.9 mg/dL (ref 0.3–1.2)
Total Protein: 7.2 g/dL (ref 6.5–8.1)

## 2021-05-06 NOTE — Progress Notes (Signed)
Pt has some questions about MM and CT scan that completed.  -Pt states that the report for her MM stated that she was "too dense". She wanted to know if she should get an ultrasound completed VS. A MM.   - Pt had a close friend that passed away and she is taking it pretty hard.  - pt wanted to know if she is getting her CT scans done 2 times a year or 1 time.

## 2021-05-06 NOTE — Assessment & Plan Note (Signed)
#  Left upper lobe adenocarcinoma stage I; June 2022- CT chest NED; STABLE; will order CT scan today-annual.  # Post thoracotomy pain--STABLE; currently on tramadol; increased neurontin 200 mg in AM; 200 mg in PM;   # History of breast cancer stage I; DEC 2022- Mammo-WNL- STABLE.   # abdominal pain [Dr.Locklear colonoscopy- Jan 31st, 2023];CT Ab 2022-OCT- no acute process.   # DISPOSITION: # Follow-up in 6 months-MD; labs- cbc/cmp; CT chest -- Dr.B

## 2021-05-06 NOTE — Progress Notes (Signed)
[place] OFFICE PROGRESS NOTE  Patient Care Team: [name], MD as PCP - General (Infectious Diseases)   Cancer Staging  No matching staging information was found for the patient.   Oncology History Overview Note  # 2012- LEFT BREAST [pt1b- STAGE I; G-2];ER/PR-Pos; her 2 Neu-NEG;  RS- 21 [risk of recurrence 13%];s/p Lumpec& SLNBx; NO chemo; s/p RT; On Aromasin  [finish April 2017]  # MAY 2019- ADENO CA LUL STAGE IA [s/p wedge resection; Dr.Oaks; [facility]]  # Osteoporosis- stopped PO bisphosphonate sec to GI issues; On reclast every 12 months; defer to PCP.  Chronic back pain-Dr.Chasnis   DIAGNOSIS: _0  Breast ca Stage I ER/PR pos; Her 2NEG; LUL adeno stage I  GOALS: curative  CURRENT/MOST RECENT THERAPY- surveillance    Carcinoma of overlapping sites of left breast in female, estrogen receptor positive ([place])  Primary cancer of left upper lobe of lung ([place])    INTERVAL HISTORY:  Katie TERRITO 66 y.o.  female pleasant patient above history of breast cancer; and also left upper lobe lung cancer is here for a follow-up.   In the interim patient noted to have abdominal pain for which she was evaluated with a CT scan in October 2022.  Patient continues to have abdominal discomfort/diarrhea.  Awaiting colonoscopy end of the month.  Patient continues to complain of left chest wall-since her lung surgery.  She continues to been gabapentin.  This is not any worse.   Review of Systems  Constitutional:  Negative for chills, diaphoresis, fever, malaise/fatigue and weight loss.  HENT:  Negative for nosebleeds and sore throat.   Eyes:  Negative for double vision.  Respiratory:  Negative for cough, hemoptysis, sputum production, shortness of breath and wheezing.   Cardiovascular:  Positive for chest pain. Negative for palpitations, orthopnea and leg swelling.  Gastrointestinal:  Negative for abdominal pain, blood in stool, constipation, diarrhea, heartburn, melena,  nausea and vomiting.  Genitourinary:  Negative for dysuria, frequency and urgency.  Musculoskeletal:  Negative for back pain and joint pain.  Skin: Negative.  Negative for itching and rash.  Neurological:  Negative for dizziness, tingling, focal weakness, weakness and headaches.  Endo/Heme/Allergies:  Does not bruise/bleed easily.  Psychiatric/Behavioral:  Negative for depression. The patient is not nervous/anxious and does not have insomnia.      PAST MEDICAL HISTORY :  Past Medical History:  Diagnosis Date   Breast cancer ([place]) 2012   left breast   COPD (chronic obstructive pulmonary disease) ([place])    Deafness in left ear    Depression    Diverticulosis    Ductal carcinoma of left breast ([place]) 2012   Family history of adverse reaction to anesthesia    mother got sick   Gastritis    GERD (gastroesophageal reflux disease)    H. pylori infection    H/O herpes labialis    Hemorrhoids    History of chicken pox    Hyperplastic colon polyp    Hypertension    Lung cancer ([place]) 08/2017   surgery only, no rad or chemo tx   Multinodular goiter (nontoxic)    Osteoporosis    Panic disorder    Personal history of radiation therapy    Recurrent sinusitis    Recurrent sinusitis    Tobacco use    Tubular adenoma of colon     PAST SURGICAL HISTORY :   Past Surgical History:  Procedure Laterality Date   BACK SURGERY  2018   lumbar  BREAST EXCISIONAL BIOPSY Left    negative 1980's twice   BREAST EXCISIONAL BIOPSY Left 2012   [facility]   BREAST LUMPECTOMY Left 2012   [facility], clear margins, LN neg   BUNIONECTOMY Right    CHOLECYSTECTOMY N/A 06/11/2017   Procedure: LAPAROSCOPIC CHOLECYSTECTOMY;  Surgeon: Herbert Pun, MD;  Location: [facility] ORS;  Service: General;  Laterality: N/A;   COLONOSCOPY WITH PROPOFOL N/A 12/28/2014   Procedure: COLONOSCOPY WITH PROPOFOL;  Surgeon: Lollie Sails, MD;  Location: [facility] ENDOSCOPY;  Service: Endoscopy;   Laterality: N/A;   COLONOSCOPY WITH PROPOFOL N/A 08/17/2017   Procedure: COLONOSCOPY WITH PROPOFOL;  Surgeon: Lollie Sails, MD;  Location: [facility] ENDOSCOPY;  Service: Endoscopy;  Laterality: N/A;   cyst throat     base of tongue   ESOPHAGOGASTRODUODENOSCOPY     ESOPHAGOGASTRODUODENOSCOPY N/A 04/13/2020   Procedure: ESOPHAGOGASTRODUODENOSCOPY (EGD);  Surgeon: Lesly Rubenstein, MD;  Location[facility] END[facility]COPY;  Service: Endoscopy;  Laterality: N/A;   ESOPHAGOGASTRODUODENOSCOPY (EGD) WITH PROPOFOL N/A 12/02/2015   Procedure: ESOPHAGOGASTRODUODENOSCOPY (EGD) WITH PROPOFOL;  Surgeon: Lollie Sails, MD;  Location: [facility] ENDOSCOPY;  Service: Endoscopy;  Laterality: N/A;   ESOPHAGOGASTRODUODENOSCOPY (EGD) WITH PROPOFOL N/A 04/13/2017   Procedure: ESOPHAGOGASTRODUODENOSCOPY (EGD) WITH PROPOFOL;  Surgeon: Lollie Sails, MD;  Location: [facility] ENDOSCOPY;  Service: Endoscopy;  Laterality: N/A;   EYE SURGERY  08/27/2019   cataract extraction right   EYE SURGERY  09/10/2019   cataract extraction left   FLEXIBLE BRONCHOSCOPY N/A 08/27/2017   Procedure: PREOP BRONCHOSCOPY;  Surgeon: Nestor Lewandowsky, MD;  Location: [facility] ORS;  Service: Thoracic;  Laterality: N/A;   GANGLION CYST EXCISION Right    wrist   Incision tendon sheath for trigger finger Left    LUMBAR LAMINECTOMY/DECOMPRESSION MICRODISCECTOMY Right 11/06/2016   Procedure: RIGHT L5/S1 HEMILAMINECTOMY AND CYST RESECTION L5/S1;  Surgeon: Deetta Perla, MD;  Location: [facility] ORS;  Service: Neurosurgery;  Laterality: Right;   MASTECTOMY  07/2010   partial mastectomy left   MULTIPLE TOOTH EXTRACTIONS N/A    9 teeth   NASAL SEPTUM SURGERY     THORACOTOMY Left 08/27/2017   Procedure: THORACOTOMY MAJOR;  Surgeon: Nestor Lewandowsky, MD;  Location: [facility] ORS;  Service: Thoracic;  Laterality: Left;   TONSILLECTOMY     TRIGGER FINGER RELEASE Left     FAMILY HISTORY :   Family History  Problem Relation Age of Onset   Breast cancer  Maternal Aunt 90       great aunt   Prostate cancer Father    Hypertension Father    Heart disease Father    Alzheimer's disease Mother    Heart disease Mother    Hypertension Mother    Colon polyps Mother    Depression Brother        brother #2    SOCIAL HISTORY:   Social History   Tobacco Use   Smoking status: Former    Packs/day: 1.00    Years: 45.00    Pack years: 45.00    Types: Cigarettes    Quit date: 07/26/2017    Years since quitting: 3.7   Smokeless tobacco: Never  Vaping Use   Vaping Use: Never used  Substance Use Topics   Alcohol use: No    Alcohol/week: 0.0 standard drinks   Drug use: No    ALLERGIES:  is allergic to erythromycin, septra ds [sulfamethoxazole-trimethoprim], sulfa antibiotics, sulfasalazine, tetracyclines & related, aspirin, demeclocycline, and latex.  MEDICATIONS:  Current Outpatient Medications  Medication Sig Dispense Refill   acetaminophen (TYLENOL)  500 MG tablet Take 500-1,000 mg by mouth 2 (two) times daily as needed for moderate pain (FOR HEADACHES/BACK PAIN.).      albuterol (PROVENTIL HFA;VENTOLIN HFA) 108 (90 BASE) MCG/ACT inhaler Inhale 2 puffs into the lungs every 6 (six) hours as needed for wheezing or shortness of breath.      ALPRAZolam (XANAX) 0.25 MG tablet Take 0.25 mg by mouth 3 (three) times daily as needed for anxiety.      atenolol (TENORMIN) 50 MG tablet Take 50 mg by mouth daily.     Calcium Carbonate-Vitamin D (CALTRATE 600+D PO) Take 1 tablet by mouth daily. Chewable     denosumab (PROLIA) 60 MG/ML SOSY injection Inject 60 mg into the skin every 6 (six) months.     eletriptan (RELPAX) 40 MG tablet Take 40 mg by mouth as needed for migraine or headache. May repeat in 2 hours if headache persists or recurs.     gabapentin (NEURONTIN) 100 MG capsule TAKE 1 CAPSULE EVERY       MORNING AND 2 CAPSULES     EVERY EVENING 90 capsule 4   hydroxypropyl methylcellulose / hypromellose (ISOPTO TEARS /  GONIOVISC) 2.5 % ophthalmic solution Place 1 drop into both eyes as needed for dry eyes.     Lifitegrast 5 % SOLN Place 1 drop into both eyes 2 (two) times daily as needed (for excessively dry eyes.).     pantoprazole (PROTONIX) 40 MG tablet Take 40 mg by mouth daily.     Pitavastatin Calcium (LIVALO) 2 MG TABS Take 1 tablet by mouth 3 (three) times a week. Monday, Wednesday,Friday     Polyethyl Glyc-Propyl Glyc PF (SYSTANE ULTRA PF) 0.4-0.3 % SOLN Apply 1 drop to eye as needed (both eye for dry eyes).     Propylene Glycol-Glycerin 0.6-0.6 % SOLN Place 1-2 drops into both eyes 5 (five) times daily as needed (for dry eyes.). SOOTHE XP     spironolactone (ALDACTONE) 50 MG tablet Take 25 mg by mouth daily.      traMADol (ULTRAM) 50 MG tablet Take 25-50 mg by mouth 2 (two) times daily as needed for moderate pain.     vitamin B-12 (CYANOCOBALAMIN) 1000 MCG tablet Take 1,000 mcg by mouth daily.     vitamin E 1000 UNIT capsule Take 1,000 Units by mouth daily.     zolpidem (AMBIEN) 10 MG tablet Take 10 mg by mouth at bedtime as needed for sleep.     ezetimibe (ZETIA) 10 MG tablet Take 1 tablet (10 mg total) by mouth daily. 90 tablet 3   No current facility-administered medications for this visit.    PHYSICAL EXAMINATION: ECOG PERFORMANCE STATUS: 1 - Symptomatic but completely ambulatory  BP 105/77    Pulse (!) 59    Temp 97.8 F (36.6 C) (Tympanic)    Wt 113 lb 12.8 oz (51.6 kg)    BMI 18.94 kg/m   Filed Weights   05/06/21 1424  Weight: 113 lb 12.8 oz (51.6 kg)    Physical Exam HENT:     Head: Normocephalic and atraumatic.     Mouth/Throat:     Pharynx: No oropharyngeal exudate.  Eyes:     Pupils: Pupils are equal, round, and reactive to light.  Cardiovascular:     Rate and Rhythm: Normal rate and regular rhythm.  Pulmonary:     Effort: No respiratory distress.     Breath sounds: No wheezing.  Abdominal:     General: Bowel sounds are normal. There is  no distension.      Palpations: Abdomen is soft. There is no mass.     Tenderness: There is no abdominal tenderness. There is no guarding or rebound.  Musculoskeletal:        General: No tenderness. Normal range of motion.     Cervical back: Normal range of motion and neck supple.  Skin:    General: Skin is warm.     Comments:    Neurological:     Mental Status: She is alert and oriented to person, place, and time.  Psychiatric:        Mood and Affect: Affect normal.       LABORATORY DATA:  I have reviewed the data as listed    Component Value Date/Time   NA 137 05/06/2021 1406   NA 136 02/06/2012 0957   K 3.8 05/06/2021 1406   K 3.8 02/06/2012 0957   CL 104 05/06/2021 1406   CO2 27 05/06/2021 1406   GLUCOSE 88 05/06/2021 1406   BUN 10 05/06/2021 1406   CREATININE 0.75 05/06/2021 1406   CREATININE 0.97 11/12/2013 1508   CALCIUM 8.9 05/06/2021 1406   CALCIUM 9.5 05/08/2012 1018   PROT 7.2 05/06/2021 1406   PROT 7.2 09/24/2020 1159   PROT 7.6 11/12/2013 1508   ALBUMIN 4.0 05/06/2021 1406   ALBUMIN 4.4 09/24/2020 1159   ALBUMIN 3.7 11/12/2013 1508   AST 15 05/06/2021 1406   AST 10 (L) 11/12/2013 1508   ALT 9 05/06/2021 1406   ALT 15 11/12/2013 1508   ALKPHOS 41 05/06/2021 1406   ALKPHOS 49 11/12/2013 1508   BILITOT 0.9 05/06/2021 1406   BILITOT 0.8 09/24/2020 1159   BILITOT 0.7 11/12/2013 1508   GFRNONAA >60 05/06/2021 1406   GFRNONAA >60 11/12/2013 1508   GFRAA >60 03/06/2018 1302   GFRAA >60 11/12/2013 1508    No results found for: SPEP, UPEP  Lab Results  Component Value Date   WBC 5.4 05/06/2021   NEUTROABS 2.3 05/06/2021   HGB 13.6 05/06/2021   HCT 40.4 05/06/2021   MCV 93.5 05/06/2021   PLT 231 05/06/2021      Chemistry      Component Value Date/Time   NA 137 05/06/2021 1406   NA 136 02/06/2012 0957   K 3.8 05/06/2021 1406   K 3.8 02/06/2012 0957   CL 104 05/06/2021 1406   CO2 27 05/06/2021 1406   BUN 10 05/06/2021 1406   CREATININE 0.75 05/06/2021 1406    CREATININE 0.97 11/12/2013 1508      Component Value Date/Time   CALCIUM 8.9 05/06/2021 1406   CALCIUM 9.5 05/08/2012 1018   ALKPHOS 41 05/06/2021 1406   ALKPHOS 49 11/12/2013 1508   AST 15 05/06/2021 1406   AST 10 (L) 11/12/2013 1508   ALT 9 05/06/2021 1406   ALT 15 11/12/2013 1508   BILITOT 0.9 05/06/2021 1406   BILITOT 0.8 09/24/2020 1159   BILITOT 0.7 11/12/2013 1508       RADIOGRAPHIC STUDIES: I have personally reviewed the radiological images as listed and agreed with the findings in the report. No results found.   ASSESSMENT & PLAN:  Primary cancer of left upper lobe of lung ([place]) #Left upper lobe adenocarcinoma stage I; June 2022- CT chest NED; STABLE; will order CT scan today-annual.  # Post thoracotomy pain--STABLE; currently on tramadol; increased neurontin 200 mg in AM; 200 mg in PM;   # History of breast cancer stage I; DEC 2022- Mammo-WNL- STABLE.   #  abdominal pain [Dr.Locklear colonoscopy- Jan 31st, 2023];CT Ab 2022-OCT- no acute process.   # DISPOSITION: # Follow-up in 6 months-MD; labs- cbc/cmp; CT chest -- Dr.B   Orders Placed This Encounter  Procedures   CT CHEST W CONTRAST    Standing Status:   Future    Standing Expiration Date:   05/06/2022    Order Specific Question:   If indicated for the ordered procedure, I authorize the administration of contrast media per Radiology protocol    Answer:   Yes    Order Specific Question:   Preferred imaging location?    Answer:   [facility]   CBC with Differential/Platelet    Standing Status:   Future    Standing Expiration Date:   05/06/2022   Comprehensive metabolic panel    Standing Status:   Future    Standing Expiration Date:   05/06/2022   All questions were answered. The patient knows to call the clinic with any problems, questions or concerns.      Cammie Sickle, MD 05/06/2021 4:35 PM

## 2021-05-16 ENCOUNTER — Encounter: Payer: Self-pay | Admitting: *Deleted

## 2021-05-17 ENCOUNTER — Ambulatory Visit: Payer: Medicare Other | Admitting: Anesthesiology

## 2021-05-17 ENCOUNTER — Encounter: Payer: Self-pay | Admitting: *Deleted

## 2021-05-17 ENCOUNTER — Ambulatory Visit
Admission: RE | Admit: 2021-05-17 | Discharge: 2021-05-17 | Disposition: A | Discharge status: Home / Self Care | Payer: Medicare Other | Attending: Gastroenterology | Admitting: Gastroenterology

## 2021-05-17 ENCOUNTER — Encounter
Admission: RE | Disposition: A | Discharge status: Home / Self Care | Payer: Self-pay | Source: Home / Self Care | Attending: Gastroenterology

## 2021-05-17 DIAGNOSIS — R1032 Left lower quadrant pain: Secondary | ICD-10-CM | POA: Diagnosis not present

## 2021-05-17 DIAGNOSIS — K219 Gastro-esophageal reflux disease without esophagitis: Secondary | ICD-10-CM | POA: Insufficient documentation

## 2021-05-17 DIAGNOSIS — E119 Type 2 diabetes mellitus without complications: Secondary | ICD-10-CM | POA: Insufficient documentation

## 2021-05-17 DIAGNOSIS — D123 Benign neoplasm of transverse colon: Secondary | ICD-10-CM | POA: Diagnosis not present

## 2021-05-17 DIAGNOSIS — I10 Essential (primary) hypertension: Secondary | ICD-10-CM | POA: Insufficient documentation

## 2021-05-17 DIAGNOSIS — Z853 Personal history of malignant neoplasm of breast: Secondary | ICD-10-CM | POA: Insufficient documentation

## 2021-05-17 DIAGNOSIS — F32A Depression, unspecified: Secondary | ICD-10-CM | POA: Insufficient documentation

## 2021-05-17 DIAGNOSIS — Z79899 Other long term (current) drug therapy: Secondary | ICD-10-CM | POA: Insufficient documentation

## 2021-05-17 DIAGNOSIS — Z85118 Personal history of other malignant neoplasm of bronchus and lung: Secondary | ICD-10-CM | POA: Diagnosis not present

## 2021-05-17 DIAGNOSIS — J449 Chronic obstructive pulmonary disease, unspecified: Secondary | ICD-10-CM | POA: Insufficient documentation

## 2021-05-17 DIAGNOSIS — K640 First degree hemorrhoids: Secondary | ICD-10-CM | POA: Diagnosis not present

## 2021-05-17 DIAGNOSIS — E0590 Thyrotoxicosis, unspecified without thyrotoxic crisis or storm: Secondary | ICD-10-CM | POA: Insufficient documentation

## 2021-05-17 DIAGNOSIS — R194 Change in bowel habit: Secondary | ICD-10-CM | POA: Insufficient documentation

## 2021-05-17 DIAGNOSIS — Z87891 Personal history of nicotine dependence: Secondary | ICD-10-CM | POA: Diagnosis not present

## 2021-05-17 DIAGNOSIS — I2510 Atherosclerotic heart disease of native coronary artery without angina pectoris: Secondary | ICD-10-CM | POA: Diagnosis not present

## 2021-05-17 DIAGNOSIS — Q438 Other specified congenital malformations of intestine: Secondary | ICD-10-CM | POA: Diagnosis not present

## 2021-05-17 DIAGNOSIS — F419 Anxiety disorder, unspecified: Secondary | ICD-10-CM | POA: Diagnosis not present

## 2021-05-17 HISTORY — PX: COLONOSCOPY: SHX5424

## 2021-05-17 SURGERY — COLONOSCOPY
Anesthesia: General

## 2021-05-17 MED ORDER — PROPOFOL 500 MG/50ML IV EMUL
INTRAVENOUS | Status: DC | PRN
  Administered 2021-05-17: 200 ug/kg/min via INTRAVENOUS

## 2021-05-17 MED ORDER — PROPOFOL 10 MG/ML IV BOLUS
INTRAVENOUS | Status: DC | PRN
  Administered 2021-05-17: 60 mg via INTRAVENOUS

## 2021-05-17 MED ORDER — SODIUM CHLORIDE 0.9 % IV SOLN
INTRAVENOUS | Status: DC
Start: 2021-05-17 — End: 2021-05-17
  Administered 2021-05-17: 1000 mL via INTRAVENOUS

## 2021-05-17 NOTE — H&P (Signed)
Outpatient short stay form Pre-procedure 05/17/2021  Katie Rubenstein, MD  Primary Physician: [name], MD  Reason for visit:  Abdominal pain  History of present illness:    66 y/o lady with history of GERD and COPD here for colonoscopy due to abdominal pain. Has history of IBS. Last colonoscopy was difficult in 2016 and had adenomatous polyp. No blood thinners. No family history of GI malignancies. No blood thinners. History of cholecystectomy.    Current Facility-Administered Medications:    0.9 %  sodium chloride infusion, , Intravenous, Continuous, [name], Hilton Cork, MD, Last Rate: 20 mL/hr at 05/17/21 1115, 1,000 mL at 05/17/21 1115  Medications Prior to Admission  Medication Sig Dispense Refill Last Dose   acetaminophen (TYLENOL) 500 MG tablet Take 500-1,000 mg by mouth 2 (two) times daily as needed for moderate pain (FOR HEADACHES/BACK PAIN.).    Past Week   albuterol (PROVENTIL HFA;VENTOLIN HFA) 108 (90 BASE) MCG/ACT inhaler Inhale 2 puffs into the lungs every 6 (six) hours as needed for wheezing or shortness of breath.    Past Month   ALPRAZolam (XANAX) 0.25 MG tablet Take 0.25 mg by mouth 3 (three) times daily as needed for anxiety.    05/16/2021   atenolol (TENORMIN) 50 MG tablet Take 50 mg by mouth daily.   05/17/2021   Calcium Carbonate-Vitamin D (CALTRATE 600+D PO) Take 1 tablet by mouth daily. Chewable   05/16/2021   denosumab (PROLIA) 60 MG/ML SOSY injection Inject 60 mg into the skin every 6 (six) months.   Past Month   ezetimibe (ZETIA) 10 MG tablet Take 1 tablet (10 mg total) by mouth daily. 90 tablet 3 05/16/2021   gabapentin (NEURONTIN) 100 MG capsule TAKE 1 CAPSULE EVERY       MORNING AND 2 CAPSULES     EVERY EVENING 90 capsule 4 Past Month   hydroxypropyl methylcellulose / hypromellose (ISOPTO TEARS / GONIOVISC) 2.5 % ophthalmic solution Place 1 drop into both eyes as needed for dry eyes.   05/16/2021   Lifitegrast 5 % SOLN Place 1 drop into both eyes 2  (two) times daily as needed (for excessively dry eyes.).   05/16/2021   pantoprazole (PROTONIX) 40 MG tablet Take 40 mg by mouth daily.   05/16/2021   Pitavastatin Calcium (LIVALO) 2 MG TABS Take 1 tablet by mouth 3 (three) times a week. Monday, Wednesday,Friday   05/16/2021   Polyethyl Glyc-Propyl Glyc PF (SYSTANE ULTRA PF) 0.4-0.3 % SOLN Apply 1 drop to eye as needed (both eye for dry eyes).   05/17/2021   Propylene Glycol-Glycerin 0.6-0.6 % SOLN Place 1-2 drops into both eyes 5 (five) times daily as needed (for dry eyes.). SOOTHE XP   05/17/2021   spironolactone (ALDACTONE) 50 MG tablet Take 25 mg by mouth daily.    05/16/2021   traMADol (ULTRAM) 50 MG tablet Take 25-50 mg by mouth 2 (two) times daily as needed for moderate pain.   Past Month   vitamin B-12 (CYANOCOBALAMIN) 1000 MCG tablet Take 1,000 mcg by mouth daily.   05/16/2021   vitamin E 1000 UNIT capsule Take 1,000 Units by mouth daily.   05/16/2021   zolpidem (AMBIEN) 10 MG tablet Take 10 mg by mouth at bedtime as needed for sleep.   Past Week   eletriptan (RELPAX) 40 MG tablet Take 40 mg by mouth as needed for migraine or headache. May repeat in 2 hours if headache persists or recurs. (Patient not taking: Reported on 05/17/2021)   Not  Taking     Allergies  Allergen Reactions   Erythromycin Anaphylaxis and Rash   Septra Ds [Sulfamethoxazole-Trimethoprim] Anaphylaxis    Neck swelling as well    Sulfa Antibiotics Anaphylaxis   Sulfasalazine Anaphylaxis   Tetracyclines & Related Hives and Itching   Aspirin Other (See Comments)    Abdominal Pain    Demeclocycline    Latex Rash    Plastic bandaids/ gloves     Past Medical History:  Diagnosis Date   Breast cancer ([place]) 2012   left breast   COPD (chronic obstructive pulmonary disease) ([place])    Deafness in left ear    Depression    Diverticulosis    Ductal carcinoma of left breast ([place]) 2012   Family history of adverse reaction to anesthesia    mother got sick   Gastritis     GERD (gastroesophageal reflux disease)    H. pylori infection    H/O herpes labialis    Hemorrhoids    History of chicken pox    Hyperplastic colon polyp    Hypertension    Lung cancer ([place]) 08/2017   surgery only, no rad or chemo tx   Multinodular goiter (nontoxic)    Osteoporosis    Panic disorder    Personal history of radiation therapy    Recurrent sinusitis    Recurrent sinusitis    Tobacco use    Tubular adenoma of colon     Review of systems:  Otherwise negative.    Physical Exam  Gen: Alert, oriented. Appears stated age.  HEENT: PERRLA. Lungs: No respiratory distress CV: RRR Abd: soft, benign, no masses Ext: No edema    Planned procedures: Proceed with colonoscopy. The patient understands the nature of the planned procedure, indications, risks, alternatives and potential complications including but not limited to bleeding, infection, perforation, damage to internal organs and possible oversedation/side effects from anesthesia. The patient agrees and gives consent to proceed.  Please refer to procedure notes for findings, recommendations and patient disposition/instructions.     Katie Rubenstein, MD [facility]

## 2021-05-17 NOTE — Interval H&P Note (Signed)
History and Physical Interval Note:  05/17/2021 11:43 AM  Katie Franklin  has presented today for surgery, with the diagnosis of BOWEL HABIT CHANGES ,LLQPAIN.  The various methods of treatment have been discussed with the patient and family. After consideration of risks, benefits and other options for treatment, the patient has consented to  Procedure(s): COLONOSCOPY (N/A) as a surgical intervention.  The patient's history has been reviewed, patient examined, no change in status, stable for surgery.  I have reviewed the patient's chart and labs.  Questions were answered to the patient's satisfaction.     Lesly Rubenstein  Ok to proceed with colonoscopy

## 2021-05-17 NOTE — Anesthesia Postprocedure Evaluation (Signed)
Anesthesia Post Note  Patient: Katie Franklin  Procedure(s) Performed: COLONOSCOPY  Patient location during evaluation: Endoscopy Anesthesia Type: General Level of consciousness: awake and alert Pain management: pain level controlled Vital Signs Assessment: post-procedure vital signs reviewed and stable Respiratory status: spontaneous breathing, nonlabored ventilation, respiratory function stable and patient connected to nasal cannula oxygen Cardiovascular status: blood pressure returned to baseline and stable Postop Assessment: no apparent nausea or vomiting Anesthetic complications: no   No notable events documented.   Last Vitals:  Vitals:   05/17/21 1054 05/17/21 1220  BP: 127/81 136/74  Pulse: 60 69  Resp: 18 (!) 28  Temp: (!) 35.8 C (!) 35.8 C  SpO2: 100% 99%    Last Pain:  Vitals:   05/17/21 1240  TempSrc:   PainSc: 10-Worst pain ever                 Precious Haws Panagiotis [place]el[place]

## 2021-05-17 NOTE — Op Note (Signed)
[facility] Patient Name: Katie Franklin Procedure Date: 05/17/2021 11:30 AM MRN: [id number] Account #: [id number] Date of Birth: 05-12-1955 Admit Type: Outpatient Age: 66 Room: [facility] ROOM 1 Gender: Female Note Status: Finalized Instrument Name: Peds Colonoscope [id number] Procedure:             Colonoscopy Indications:           Abdominal pain in the left lower quadrant, Change in                         bowel habits Providers:             Andrey Farmer MD, MD Medicines:             Monitored Anesthesia Care Complications:         No immediate complications. Estimated blood loss:                         Minimal. Procedure:             Pre-Anesthesia Assessment:                        - Prior to the procedure, a History and Physical was                         performed, and patient medications and allergies were                         reviewed. The patient is competent. The risks and                         benefits of the procedure and the sedation options and                         risks were discussed with the patient. All questions                         were answered and informed consent was obtained.                         Patient identification and proposed procedure were                         verified by the physician, the nurse, the                         anesthesiologist, the anesthetist and the technician                         in the endoscopy suite. Mental Status Examination:                         alert and oriented. Airway Examination: normal                         oropharyngeal airway and neck mobility. Respiratory                         Examination: clear to auscultation. CV Examination:  normal. Prophylactic Antibiotics: The patient does not                         require prophylactic antibiotics. Prior                         Anticoagulants: The patient has taken no previous                          anticoagulant or antiplatelet agents. ASA Grade                         Assessment: II - A patient with mild systemic disease.                         After reviewing the risks and benefits, the patient                         was deemed in satisfactory condition to undergo the                         procedure. The anesthesia plan was to use monitored                         anesthesia care (MAC). Immediately prior to                         administration of medications, the patient was                         re-assessed for adequacy to receive sedatives. The                         heart rate, respiratory rate, oxygen saturations,                         blood pressure, adequacy of pulmonary ventilation, and                         response to care were monitored throughout the                         procedure. The physical status of the patient was                         re-assessed after the procedure.                        After obtaining informed consent, the colonoscope was                         passed under direct vision. Throughout the procedure,                         the patient's blood pressure, pulse, and oxygen                         saturations were monitored continuously. The  Colonoscope was introduced through the anus and                         advanced to the the terminal ileum. The colonoscopy                         was somewhat difficult due to a redundant colon.                         Successful completion of the procedure was aided by                         withdrawing the scope and replacing with the adult                         colonoscope. The patient tolerated the procedure well.                         The quality of the bowel preparation was poor. Findings:      The perianal and digital rectal examinations were normal.      The terminal ileum appeared normal.      A 4 mm polyp was found in the transverse colon. The  polyp was sessile.       The polyp was removed with a cold snare. Resection and retrieval were       complete. Estimated blood loss was minimal.      Internal hemorrhoids were found during retroflexion. The hemorrhoids       were Grade I (internal hemorrhoids that do not prolapse).      The exam was otherwise without abnormality on direct and retroflexion       views. Impression:            - Preparation of the colon was poor.                        - The examined portion of the ileum was normal.                        - One 4 mm polyp in the transverse colon, removed with                         a cold snare. Resected and retrieved.                        - Internal hemorrhoids.                        - The examination was otherwise normal on direct and                         retroflexion views. Recommendation:        - Discharge patient to home.                        - Resume previous diet.                        - Continue present medications.                        -  Await pathology results.                        - Repeat colonoscopy in 6 months because the bowel                         preparation was suboptimal. On next colonoscopy,                         recommend using adult colonoscopy given redundant                         colon.                        - Return to referring physician as previously                         scheduled. Procedure Code(s):     --- Professional ---                        [phone], Colonoscopy, flexible; with removal of                         tumor(s), polyp(s), or other lesion(s) by snare                         technique Diagnosis Code(s):     --- Professional ---                        K64.0, First degree hemorrhoids                        K63.5, Polyp of colon                        R10.32, Left lower quadrant pain                        R19.4, Change in bowel habit CPT copyright 2019 American Medical Association. All rights reserved. The  codes documented in this report are preliminary and upon coder review may  be revised to meet current compliance requirements. Andrey Farmer MD, MD 05/17/2021 12:21:54 PM Number of Addenda: 0 Note Initiated On: 05/17/2021 11:30 AM Scope Withdrawal Time: 0 hours 5 minutes 0 seconds  Total Procedure Duration: 0 hours 26 minutes 43 seconds  Estimated Blood Loss:  Estimated blood loss was minimal.      [facility]

## 2021-05-17 NOTE — Anesthesia Procedure Notes (Signed)
Date/Time: 05/17/2021 11:48 AM Performed by: Nelda Marseille, CRNA Pre-anesthesia Checklist: Patient identified, Emergency Drugs available, Suction available, Patient being monitored and Timeout performed Oxygen Delivery Method: Nasal cannula

## 2021-05-17 NOTE — Transfer of Care (Signed)
Immediate Anesthesia Transfer of Care Note  Patient: Katie Franklin  Procedure(s) Performed: COLONOSCOPY  Patient Location: PACU and Endoscopy Unit  Anesthesia Type:General  Level of Consciousness: drowsy and patient cooperative  Airway & Oxygen Therapy: Patient Spontanous Breathing  Post-op Assessment: Report given to RN and Post -op Vital signs reviewed and stable  Post vital signs: Reviewed and stable  Last Vitals:  Vitals Value Taken Time  BP 136/74 05/17/21 1221  Temp    Pulse 70 05/17/21 1221  Resp 32 05/17/21 1221  SpO2 99 % 05/17/21 1221  Vitals shown include unvalidated device data.  Last Pain:  Vitals:   05/17/21 1054  TempSrc: Temporal  PainSc: 7          Complications: No notable events documented.

## 2021-05-17 NOTE — Anesthesia Preprocedure Evaluation (Signed)
Anesthesia Evaluation  Patient identified by MRN, date of birth, ID band Patient awake    Reviewed: Allergy & Precautions, NPO status , Patient's Chart, lab work & pertinent test results  History of Anesthesia Complications (+) Family history of anesthesia reactionNegative for: history of anesthetic complications  Airway Mallampati: II  TM Distance: >3 FB Neck ROM: limited    Dental  (+) Edentulous Upper, Poor Dentition, Chipped, Missing   Pulmonary neg sleep apnea, COPD,  COPD inhaler, Not current smoker, former smoker,           Cardiovascular hypertension, Pt. 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 pylori infection No date: H/O herpes labialis No date: Hemorrhoids No date: History of chicken pox No date: Hyperplastic colon polyp No date: Hypertension 08/2017: Lung cancer ([place])     Comment:  surgery only, no rad or chemo tx No date: Multinodular goiter (nontoxic) No date: Osteoporosis No date: Panic disorder No date: Personal history of radiation therapy No date: Recurrent sinusitis No date: Recurrent sinusitis No date: Tobacco use No date: Tubular adenoma of  colon   Reproductive/Obstetrics                             Anesthesia Physical  Anesthesia Plan  ASA: 3  Anesthesia Plan: General   Post-op Pain Management:    Induction: Intravenous  PONV Risk Score and Plan: 3 and Propofol infusion, TIVA and Treatment may vary due to age or medical condition  Airway Management Planned: Nasal Cannula  Additional Equipment:   Intra-op Plan:   Post-operative Plan:   Informed Consent: I have reviewed the patients History and Physical, chart, labs and discussed the procedure including the risks, benefits and alternatives for the proposed anesthesia with the patient or authorized representative who has indicated his/her understanding and acceptance.     Dental Advisory Given  Plan Discussed with: Anesthesiologist, CRNA and Surgeon  Anesthesia Plan Comments: (Patient consented for risks of anesthesia including but not limited to:  - adverse reactions to medications - risk of airway placement if required - damage to eyes, teeth, lips or other oral mucosa - nerve damage due to positioning  - sore throat or hoarseness - Damage to heart, brain, nerves, lungs, other parts of body or loss of life  Patient voiced understanding.)        Anesthesia Quick Evaluation

## 2021-05-18 ENCOUNTER — Encounter: Payer: Self-pay | Admitting: Gastroenterology

## 2021-05-18 LAB — SURGICAL PATHOLOGY

## 2021-09-26 ENCOUNTER — Other Ambulatory Visit: Payer: Self-pay | Admitting: Cardiovascular Disease

## 2021-09-27 ENCOUNTER — Encounter (INDEPENDENT_AMBULATORY_CARE_PROVIDER_SITE_OTHER): Payer: Medicare Other | Admitting: Ophthalmology

## 2021-09-27 DIAGNOSIS — I10 Essential (primary) hypertension: Secondary | ICD-10-CM | POA: Diagnosis not present

## 2021-09-27 DIAGNOSIS — H353132 Nonexudative age-related macular degeneration, bilateral, intermediate dry stage: Secondary | ICD-10-CM

## 2021-09-27 DIAGNOSIS — H35033 Hypertensive retinopathy, bilateral: Secondary | ICD-10-CM

## 2021-09-27 DIAGNOSIS — H43813 Vitreous degeneration, bilateral: Secondary | ICD-10-CM

## 2021-10-07 ENCOUNTER — Other Ambulatory Visit (HOSPITAL_COMMUNITY): Payer: Self-pay

## 2021-10-18 ENCOUNTER — Other Ambulatory Visit: Payer: Self-pay | Admitting: Cardiovascular Disease

## 2021-10-20 NOTE — Progress Notes (Signed)
Cardiology Office Note  Date:  10/21/2021   ID:  Katie Franklin, DOB [birth year], MRN [id number]  PCP:  [name], MD   Chief Complaint  Patient presents with   12 month follow up     "Doing well." Medications reviewed by the patient's home medication list.     HPI:  Katie Franklin is a 66 y.o. female.   Active smoker COPD: Moderate centrilobular and mild paraseptal emphysema on CT scan left-sided breast cancer treated with lumpectomy and radiation therapy.   Laparoscopic Cholecystectomy.  Last CT scan 08/2019: Significant coronary calcification noted, aortic atherosclerosis Who presents for follow-up of CAD on CT scan/Pet scan  LOV with myself 6/22 Feels well, Reports that she is not smoking Follows with oncology for lung nodules, has CT scan pending  On zetia, no side effects Total chol 240 to 208  Denies significant leg cramping Previously had rash on Crestor Tried Livalo but this was too expensive  EKG personally reviewed by myself on todays visit Nsr rate 58 bpm no St or T wave changes  Last CT scan 08/2019 Significant coronary calcification noted, aortic atherosclerosis   Other past medical history reviewed pulmonary function studies  FEV1 of approximately 70% and a DLCO of approximately 80%.     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 pylori infection, H/O herpes labialis, Hemorrhoids, History of chicken pox, Hyperplastic colon polyp, Hypertension, Lung cancer ([place]) (08/2017), Multinodular goiter (nontoxic), Osteoporosis, Panic disorder, Personal history of radiation therapy, Recurrent  sinusitis, Recurrent sinusitis, Tobacco use, and Tubular adenoma of colon.  PSH:    Past Surgical History:  Procedure Laterality Date   BACK SURGERY  2018   lumbar   BREAST EXCISIONAL BIOPSY Left    negative 1980's twice   BREAST EXCISIONAL BIOPSY Left 2012   [facility]   BREAST LUMPECTOMY Left 2012   [facility], clear margins, LN neg   BUNIONECTOMY Right    CHOLECYSTECTOMY N/A 06/11/2017   Procedure: LAPAROSCOPIC CHOLECYSTECTOMY;  Surgeon: Herbert Pun, MD;  Location: [facility] ORS;  Service: General;  Laterality: N/A;   COLONOSCOPY N/A 05/17/2021   Procedure: COLONOSCOPY;  Surgeon: Lesly Rubenstein, MD;  Location: [facility] ENDOSCOPY;  Service: Endoscopy;  Laterality: N/A;   COLONOSCOPY WITH PROPOFOL N/A 12/28/2014   Procedure: COLONOSCOPY WITH PROPOFOL;  Surgeon: Lollie Sails, MD;  Location: [facility] ENDOSCOPY;  Service: Endoscopy;  Laterality: N/A;   COLONOSCOPY WITH PROPOFOL N/A 08/17/2017   Procedure: COLONOSCOPY WITH PROPOFOL;  Surgeon: Lollie Sails, MD;  Location: [facility] ENDOSCOPY;  Service: Endoscopy;  Laterality: N/A;   cyst throat     base of tongue   ESOPHAGOGASTRODUODENOSCOPY     ESOPHAGOGASTRODUODENOSCOPY N/A 04/13/2020   Procedure: ESOPHAGOGASTRODUODENOSCOPY (EGD);  Surgeon: Lesly Rubenstein, MD;  Location: [facility] ENDOSCOPY;  Service: Endoscopy;  Laterality: N/A;   ESOPHAGOGASTRODUODENOSCOPY (EGD) WITH PROPOFOL N/A 12/02/2015   Procedure: ESOPHAGOGASTRODUODENOSCOPY (EGD) WITH PROPOFOL;  Surgeon: Lollie Sails, MD;  Location: [facility] ENDOSCOPY;  Service: Endoscopy;  Laterality: N/A;   ESOPHAGOGASTRODUODENOSCOPY (EGD) WITH PROPOFOL N/A 04/13/2017   Procedure: ESOPHAGOGASTRODUODENOSCOPY (EGD) WITH PROPOFOL;  Surgeon: Lollie Sails, MD;  Location: [facility] ENDOSCOPY;  Service: Endoscopy;  Laterality: N/A;   EYE SURGERY  08/27/2019   cataract extraction right   EYE SURGERY  09/10/2019   cataract extraction left   FLEXIBLE BRONCHOSCOPY N/A 08/27/2017   Procedure: PREOP  BRONCHOSCOPY;  Surgeon: Nestor Lewandowsky, MD;  Location: [facility] ORS;  Service: Thoracic;  Laterality: N/A;   GANGLION CYST EXCISION Right    wrist   Incision tendon sheath for trigger finger Left    LUMBAR LAMINECTOMY/DECOMPRESSION MICRODISCECTOMY Right 11/06/2016   Procedure: RIGHT L5/S1 HEMILAMINECTOMY AND CYST RESECTION L5/S1;  Surgeon: Deetta Perla, MD;  Location: [facility] ORS;  Service: Neurosurgery;  Laterality: Right;   MASTECTOMY  07/2010   partial mastectomy left   MULTIPLE TOOTH EXTRACTIONS N/A    9 teeth   NASAL SEPTUM SURGERY     THORACOTOMY Left 08/27/2017   Procedure: THORACOTOMY MAJOR;  Surgeon: Nestor Lewandowsky, MD;  Location: [facility] ORS;  Service: Thoracic;  Laterality: Left;   TONSILLECTOMY     TRIGGER FINGER RELEASE Left     Current Outpatient Medications  Medication Sig Dispense Refill   acetaminophen (TYLENOL) 500 MG tablet Take 500-1,000 mg by mouth 2 (two) times daily as needed for moderate pain (FOR HEADACHES/BACK PAIN.).      albuterol (PROVENTIL HFA;VENTOLIN HFA) 108 (90 BASE) MCG/ACT inhaler Inhale 2 puffs into the lungs every 6 (six) hours as needed for wheezing or shortness of breath.      ALPRAZolam (XANAX) 0.25 MG tablet Take 0.25 mg by mouth 3 (three) times daily as needed for anxiety.      atenolol (TENORMIN) 50 MG tablet Take 50 mg by mouth daily.     Calcium Carbonate-Vitamin D (CALTRATE 600+D PO) Take 1 tablet by mouth daily. Chewable     ezetimibe (ZETIA) 10 MG tablet Take 1 tablet by mouth once daily 30 tablet 0   hydroxypropyl methylcellulose / hypromellose (ISOPTO TEARS / GONIOVISC) 2.5 % ophthalmic solution Place 1 drop into both eyes as needed for dry eyes.     Lifitegrast 5 % SOLN Place 1 drop into both eyes 2 (two) times daily as needed (for excessively dry eyes.).     Multiple Vitamins-Minerals (PRESERVISION AREDS 2+MULTI VIT PO) Take by mouth in the morning and at bedtime.     pantoprazole (PROTONIX) 40 MG tablet Take 40 mg by mouth daily.     Polyethyl  Glyc-Propyl Glyc PF (SYSTANE ULTRA PF) 0.4-0.3 % SOLN Apply 1 drop to eye as needed (both eye for dry eyes).     pregabalin (LYRICA) 25 MG capsule Take 25 mg by mouth 2 (two) times daily.     Propylene Glycol-Glycerin 0.6-0.6 % SOLN Place 1-2 drops into both eyes 5 (five) times daily as needed (for dry eyes.). SOOTHE XP     spironolactone (ALDACTONE) 50 MG tablet Take 25 mg by mouth daily.      traMADol (ULTRAM) 50 MG tablet Take 25-50 mg by mouth 2 (two) times daily as needed for moderate pain.     vitamin B-12 (CYANOCOBALAMIN) 1000 MCG tablet Take 1,000 mcg by mouth daily.     vitamin E 1000 UNIT capsule Take 1,000 Units by mouth daily.     zolpidem (AMBIEN) 10 MG tablet Take 10 mg by mouth at bedtime as needed for sleep.     denosumab (PROLIA) 60 MG/ML SOSY injection Inject 60 mg into the skin every 6 (six) months. (Patient not taking: Reported on 10/21/2021)     eletriptan (RELPAX) 40 MG tablet Take 40 mg by mouth as needed  for migraine or headache. May repeat in 2 hours if headache persists or recurs. (Patient not taking: Reported on 05/17/2021)     gabapentin (NEURONTIN) 100 MG capsule TAKE 1 CAPSULE EVERY       MORNING AND 2 CAPSULES     EVERY EVENING (Patient not taking: Reported on 10/21/2021) 90 capsule 4   No current facility-administered medications for this visit.     Allergies:   Erythromycin, Septra ds [sulfamethoxazole-trimethoprim], Sulfa antibiotics, Sulfasalazine, Tetracyclines & related, Aspirin, Demeclocycline, and Latex   Social History:  The patient  reports that she quit smoking about 4 years ago. Her smoking use included cigarettes. She has a 45.00 pack-year smoking history. She has never used smokeless tobacco. She reports that she does not drink alcohol and does not use drugs.   Family History:   family history includes Alzheimer's disease in her mother; Breast cancer (age of onset: 88) in her maternal aunt; Colon polyps in her mother; Depression in her brother; Heart  disease in her father and mother; Hypertension in her father and mother; Prostate cancer in her father.    Review of Systems: Review of Systems  Constitutional: Negative.   HENT: Negative.    Respiratory: Negative.    Cardiovascular: Negative.   Gastrointestinal: Negative.   Neurological: Negative.   Psychiatric/Behavioral: Negative.    All other systems reviewed and are negative.   PHYSICAL EXAM: VS:  BP 112/70 (BP Location: Left Arm, Patient Position: Sitting, Cuff Size: Normal)   Pulse (!) 58   Ht _0  (1.651 m)   Wt 120 lb (54.4 kg)   SpO2 98%   BMI 19.97 kg/m  , BMI Body mass index is 19.97 kg/m. Constitutional:  oriented to person, place, and time. No distress.  HENT:  Head: Grossly normal Eyes:  no discharge. No scleral icterus.  Neck: No JVD, no carotid bruits  Cardiovascular: Regular rate and rhythm, no murmurs appreciated Pulmonary/Chest: Clear to auscultation bilaterally, no wheezes or rails Abdominal: Soft.  no distension.  no tenderness.  Musculoskeletal: Normal range of motion Neurological:  normal muscle tone. Coordination normal. No atrophy Skin: Skin warm and dry Psychiatric: normal affect, pleasant  Recent Labs: 05/06/2021: ALT 9; BUN 10; Creatinine, Ser 0.75; Hemoglobin 13.6; Platelets 231; Potassium 3.8; Sodium 137    Lipid Panel Lab Results  Component Value Date   CHOL 185 09/24/2020   HDL 74 09/24/2020   LDLCALC 97 09/24/2020   TRIG 79 09/24/2020      Wt Readings from Last 3 Encounters:  10/21/21 120 lb (54.4 kg)  05/17/21 115 lb (52.2 kg)  05/06/21 113 lb 12.8 oz (51.6 kg)     ASSESSMENT AND PLAN:  Aortic atherosclerosis (HCC) Recommend she consider starting a atorvastatin 10 mg daily Continue Zetia Goal LDL less than 70 Continued smoking cessation  Centrilobular emphysema (HCC) Reports that she quit smoking Previously reported chronic mild shortness of breath on exertion History of lobectomy  Coronary artery disease  involving native coronary artery of native heart without angina pectoris -  Currently with no symptoms of angina. No further workup at this time. Continue current medication regimen.  Smoker Reports that she quit smoking over a year ago  Essential hypertension - Plan: EKG 12-Lead Blood pressure is well controlled on today's visit. No changes made to the medications.  History of breast cancer Prior radiation on the left  Stressed importance of aggressive lipid control,  She has quit smoking  Leg pain Lower extremity arterial Dopplers were normal June  2022   Total encounter time more than 30 minutes  Greater than 50% was spent in counseling and coordination of care with the patient    No orders of the defined types were placed in this encounter.    Signed, Esmond Plants, M.D., Ph.D. 10/21/2021  [place], [place]

## 2021-10-21 ENCOUNTER — Encounter: Payer: Self-pay | Admitting: Cardiovascular Disease

## 2021-10-21 ENCOUNTER — Ambulatory Visit (INDEPENDENT_AMBULATORY_CARE_PROVIDER_SITE_OTHER): Payer: Medicare Other | Admitting: Cardiovascular Disease

## 2021-10-21 VITALS — BP 112/70 | HR 58 | Ht 65.0 in | Wt 120.0 lb

## 2021-10-21 DIAGNOSIS — I10 Essential (primary) hypertension: Secondary | ICD-10-CM

## 2021-10-21 DIAGNOSIS — I2510 Atherosclerotic heart disease of native coronary artery without angina pectoris: Secondary | ICD-10-CM

## 2021-10-21 DIAGNOSIS — J432 Centrilobular emphysema: Secondary | ICD-10-CM

## 2021-10-21 DIAGNOSIS — E782 Mixed hyperlipidemia: Secondary | ICD-10-CM

## 2021-10-21 DIAGNOSIS — Z87891 Personal history of nicotine dependence: Secondary | ICD-10-CM

## 2021-10-21 DIAGNOSIS — I739 Peripheral vascular disease, unspecified: Secondary | ICD-10-CM

## 2021-10-21 DIAGNOSIS — I700 Atherosclerosis of aorta: Secondary | ICD-10-CM

## 2021-10-21 DIAGNOSIS — R002 Palpitations: Secondary | ICD-10-CM

## 2021-10-21 MED ORDER — EZETIMIBE 10 MG PO TABS: 10.0000 mg | ORAL_TABLET | Freq: Every day | ORAL | 3 refills | Status: DC

## 2021-10-21 MED ORDER — ATORVASTATIN CALCIUM 10 MG PO TABS: 10.0000 mg | ORAL_TABLET | Freq: Every day | ORAL | 3 refills | Status: DC

## 2021-10-21 NOTE — Patient Instructions (Addendum)
Medication Instructions:  Please start atorvastatin 10 mg daily Stay on zetia   If you need a refill on your cardiac medications before your next appointment, please call your pharmacy.   Lab work: No new labs needed  Testing/Procedures: No new testing needed  Follow-Up: At [facility], you and your health needs are our priority.  As part of our continuing mission to provide you with exceptional heart care, we have created designated Provider Care Teams.  These Care Teams include your primary Cardiologist (physician) and Advanced Practice Providers (APPs -  Physician Assistants and Nurse Practitioners) who all work together to provide you with the care you need, when you need it.  You will need a follow up appointment in 12 months  Providers on your designated Care Team:   Murray Hodgkins, NP Christell Faith, PA-C Cadence Kathlen Mody, [place]  COVID-19 Vaccine Information can be found at: [email or web address].uk For questions related to vaccine distribution or appointments, please email vaccine@ .com or call [phone].

## 2021-11-02 ENCOUNTER — Ambulatory Visit
Admission: RE | Admit: 2021-11-02 | Discharge: 2021-11-02 | Disposition: A | Discharge status: Home / Self Care | Payer: Medicare Other | Source: Ambulatory Visit | Attending: Internal Medicine | Admitting: Internal Medicine

## 2021-11-02 DIAGNOSIS — C3412 Malignant neoplasm of upper lobe, left bronchus or lung: Secondary | ICD-10-CM | POA: Insufficient documentation

## 2021-11-02 MED ORDER — IOHEXOL 300 MG/ML  SOLN
75.0000 mL | Freq: Once | INTRAMUSCULAR | Status: AC | PRN
  Administered 2021-11-02: 75 mL via INTRAVENOUS

## 2021-11-04 ENCOUNTER — Ambulatory Visit: Payer: Medicare Other | Admitting: Internal Medicine

## 2021-11-04 ENCOUNTER — Inpatient Hospital Stay: Payer: Medicare Other | Attending: Oncology

## 2021-11-04 ENCOUNTER — Encounter: Payer: Self-pay | Admitting: Medical Oncology

## 2021-11-04 ENCOUNTER — Inpatient Hospital Stay (HOSPITAL_BASED_OUTPATIENT_CLINIC_OR_DEPARTMENT_OTHER): Payer: Medicare Other | Admitting: Medical Oncology

## 2021-11-04 VITALS — BP 121/67 | HR 58 | Temp 96.3°F | Resp 16 | Wt 117.0 lb

## 2021-11-04 DIAGNOSIS — G8912 Acute post-thoracotomy pain: Secondary | ICD-10-CM | POA: Diagnosis not present

## 2021-11-04 DIAGNOSIS — Z08 Encounter for follow-up examination after completed treatment for malignant neoplasm: Secondary | ICD-10-CM | POA: Diagnosis present

## 2021-11-04 DIAGNOSIS — C50812 Malignant neoplasm of overlapping sites of left female breast: Secondary | ICD-10-CM | POA: Diagnosis not present

## 2021-11-04 DIAGNOSIS — Z853 Personal history of malignant neoplasm of breast: Secondary | ICD-10-CM | POA: Insufficient documentation

## 2021-11-04 DIAGNOSIS — C3412 Malignant neoplasm of upper lobe, left bronchus or lung: Secondary | ICD-10-CM

## 2021-11-04 DIAGNOSIS — Z170 Estrogen receptor positive status [ER+]: Secondary | ICD-10-CM | POA: Diagnosis not present

## 2021-11-04 DIAGNOSIS — Z85118 Personal history of other malignant neoplasm of bronchus and lung: Secondary | ICD-10-CM | POA: Insufficient documentation

## 2021-11-04 DIAGNOSIS — Z79899 Other long term (current) drug therapy: Secondary | ICD-10-CM | POA: Diagnosis not present

## 2021-11-04 LAB — COMPREHENSIVE METABOLIC PANEL
ALT: 11 U/L (ref 0–44)
AST: 17 U/L (ref 15–41)
Albumin: 4.2 g/dL (ref 3.5–5.0)
Alkaline Phosphatase: 35 U/L — ABNORMAL LOW (ref 38–126)
Anion gap: 5 (ref 5–15)
BUN: 9 mg/dL (ref 8–23)
CO2: 30 mmol/L (ref 22–32)
Calcium: 9.7 mg/dL (ref 8.9–10.3)
Chloride: 105 mmol/L (ref 98–111)
Creatinine, Ser: 0.8 mg/dL (ref 0.44–1.00)
GFR, Estimated: >60 mL/min (ref >60)
Glucose, Bld: 97 mg/dL (ref 70–99)
Potassium: 4.1 mmol/L (ref 3.5–5.1)
Sodium: 140 mmol/L (ref 135–145)
Total Bilirubin: 0.9 mg/dL (ref 0.3–1.2)
Total Protein: 7.2 g/dL (ref 6.5–8.1)

## 2021-11-04 LAB — CBC WITH DIFFERENTIAL/PLATELET
Abs Immature Granulocytes: 0.01 10*3/uL (ref 0.00–0.07)
Basophils Absolute: 0 10*3/uL (ref 0.0–0.1)
Basophils Relative: 1 %
Eosinophils Absolute: 0.1 10*3/uL (ref 0.0–0.5)
Eosinophils Relative: 2 %
HCT: 42.8 % (ref 36.0–46.0)
Hemoglobin: 14.4 g/dL (ref 12.0–15.0)
Immature Granulocytes: 0 %
Lymphocytes Relative: 39 %
Lymphs Abs: 2.2 10*3/uL (ref 0.7–4.0)
MCH: 31.6 pg (ref 26.0–34.0)
MCHC: 33.6 g/dL (ref 30.0–36.0)
MCV: 94.1 fL (ref 80.0–100.0)
Monocytes Absolute: 0.8 10*3/uL (ref 0.1–1.0)
Monocytes Relative: 13 %
Neutro Abs: 2.6 10*3/uL (ref 1.7–7.7)
Neutrophils Relative %: 45 %
Platelets: 228 10*3/uL (ref 150–400)
RBC: 4.55 MIL/uL (ref 3.87–5.11)
RDW: 12.8 % (ref 11.5–15.5)
WBC: 5.7 10*3/uL (ref 4.0–10.5)
nRBC: 0 % (ref 0.0–0.2)

## 2021-11-04 NOTE — Addendum Note (Signed)
Addended by: Harvel Ricks A on: 11/04/2021 02:33 PM   Modules accepted: Orders

## 2021-11-04 NOTE — Progress Notes (Signed)
Pt in for follow up, states seeing neurologist for neuropathy in feet.

## 2021-11-04 NOTE — Progress Notes (Signed)
[place] OFFICE PROGRESS NOTE  Patient Care Team: [name], MD as PCP - General (Infectious Diseases)   Cancer Staging  No matching staging information was found for the patient.   Oncology History Overview Note  # 2012- LEFT BREAST [pt1b- STAGE I; G-2];ER/PR-Pos; her 2 Neu-NEG;  RS- 19 [risk of recurrence 13%];s/p Lumpec& SLNBx; NO chemo; s/p RT; On Aromasin  [finish April 2017]  # MAY 2019- ADENO CA LUL STAGE IA [s/p wedge resection; Dr.Oaks; [facility]]  # Osteoporosis- stopped PO bisphosphonate sec to GI issues; On reclast every 12 months; defer to PCP.  Chronic back pain-Dr.Chasnis   DIAGNOSIS: _0  Breast ca Stage I ER/PR pos; Her 2NEG; LUL adeno stage I  GOALS: curative  CURRENT/MOST RECENT THERAPY- surveillance    Carcinoma of overlapping sites of left breast in female, estrogen receptor positive ([place])  Primary cancer of left upper lobe of lung ([place])    INTERVAL HISTORY:  Katie Franklin 66 y.o.  female pleasant patient above history of breast cancer; and also left upper lobe lung cancer is here for a follow-up.   Patient reports that she has been doing well. She has seen neurology since her last visit and was switched from gabapentin to lyrica- has not noticed a large difference but she is tolerating it well. Her next follow up is in Sept. Otherwise feeling well. No unintentional weight loss, new cough, SOB, hemoptysis. Her recent CT scan was on 11/02/2021.    Review of Systems  Constitutional:  Negative for chills, diaphoresis, fever, malaise/fatigue and weight loss.  HENT:  Negative for nosebleeds and sore throat.   Eyes:  Negative for double vision.  Respiratory:  Negative for cough, hemoptysis, sputum production, shortness of breath and wheezing.   Cardiovascular:  Positive for chest pain. Negative for palpitations, orthopnea and leg swelling.  Gastrointestinal:  Negative for abdominal pain, blood in stool, constipation, diarrhea, heartburn,  melena, nausea and vomiting.  Genitourinary:  Negative for dysuria, frequency and urgency.  Musculoskeletal:  Negative for back pain and joint pain.  Skin: Negative.  Negative for itching and rash.  Neurological:  Negative for dizziness, tingling, focal weakness, weakness and headaches.  Endo/Heme/Allergies:  Does not bruise/bleed easily.  Psychiatric/Behavioral:  Negative for depression. The patient is not nervous/anxious and does not have insomnia.       PAST MEDICAL HISTORY :  Past Medical History:  Diagnosis Date   Breast cancer ([place]) 2012   left breast   COPD (chronic obstructive pulmonary disease) ([place])    Deafness in left ear    Depression    Diverticulosis    Ductal carcinoma of left breast ([place]) 2012   Family history of adverse reaction to anesthesia    mother got sick   Gastritis    GERD (gastroesophageal reflux disease)    H. pylori infection    H/O herpes labialis    Hemorrhoids    History of chicken pox    Hyperplastic colon polyp    Hypertension    Lung cancer ([place]) 08/2017   surgery only, no rad or chemo tx   Multinodular goiter (nontoxic)    Osteoporosis    Panic disorder    Personal history of radiation therapy    Recurrent sinusitis    Recurrent sinusitis    Tobacco use    Tubular adenoma of colon     PAST SURGICAL HISTORY :   Past Surgical History:  Procedure Laterality Date   BACK SURGERY  2018  lumbar   BREAST EXCISIONAL BIOPSY Left    negative 1980's twice   BREAST EXCISIONAL BIOPSY Left 2012   [facility]   BREAST LUMPECTOMY Left 2012   [facility], clear margins, LN neg   BUNIONECTOMY Right    CHOLECYSTECTOMY N/A 06/11/2017   Procedure: LAPAROSCOPIC CHOLECYSTECTOMY;  Surgeon: Herbert Pun, MD;  Location: [facility] ORS;  Service: General;  Laterality: N/A;   COLONOSCOPY N/A 05/17/2021   Procedure: COLONOSCOPY;  Surgeon: Lesly Rubenstein, MD;  Location: [facility] ENDOSCOPY;  Service: Endoscopy;  Laterality: N/A;   COLONOSCOPY WITH PROPOFOL N/A  12/28/2014   Procedure: COLONOSCOPY WITH PROPOFOL;  Surgeon: Lollie Sails, MD;  Location: [facility] ENDOSCOPY;  Service: Endoscopy;  Laterality: N/A;   COLONOSCOPY WITH PROPOFOL N/A 08/17/2017   Procedure: COLONOSCOPY WITH PROPOFOL;  Surgeon: Lollie Sails, MD;  Location: [facility] ENDOSCOPY;  Service: Endoscopy;  Laterality: N/A;   cyst throat     base of tongue   ESOPHAGOGASTRODUODENOSCOPY     ESOPHAGOGASTRODUODENOSCOPY N/A 04/13/2020   Procedure: ESOPHAGOGASTRODUODENOSCOPY (EGD);  Surgeon: Lesly Rubenstein, MD;  Location: [facility] ENDOSCOPY;  Service: Endoscopy;  Laterality: N/A;   ESOPHAGOGASTRODUODENOSCOPY (EGD) WITH PROPOFOL N/A 12/02/2015   Procedure: ESOPHAGOGASTRODUODENOSCOPY (EGD) WITH PROPOFOL;  Surgeon: Lollie Sails, MD;  Location: [facility] ENDOSCOPY;  Service: Endoscopy;  Laterality: N/A;   ESOPHAGOGASTRODUODENOSCOPY (EGD) WITH PROPOFOL N/A 04/13/2017   Procedure: ESOPHAGOGASTRODUODENOSCOPY (EGD) WITH PROPOFOL;  Surgeon: Lollie Sails, MD;  Location: [facility] ENDOSCOPY;  Service: Endoscopy;  Laterality: N/A;   EYE SURGERY  08/27/2019   cataract extraction right   EYE SURGERY  09/10/2019   cataract extraction left   FLEXIBLE BRONCHOSCOPY N/A 08/27/2017   Procedure: PREOP BRONCHOSCOPY;  Surgeon: Nestor Lewandowsky, MD;  Location: [facility] ORS;  Service: Thoracic;  Laterality: N/A;   GANGLION CYST EXCISION Right    wrist   Incision tendon sheath for trigger finger Left    LUMBAR LAMINECTOMY/DECOMPRESSION MICRODISCECTOMY Right 11/06/2016   Procedure: RIGHT L5/S1 HEMILAMINECTOMY AND CYST RESECTION L5/S1;  Surgeon: Deetta Perla, MD;  Location: [facility] ORS;  Service: Neurosurgery;  Laterality: Right;   MASTECTOMY  07/2010   partial mastectomy left   MULTIPLE TOOTH EXTRACTIONS N/A    9 teeth   NASAL SEPTUM SURGERY     THORACOTOMY Left 08/27/2017   Procedure: THORACOTOMY MAJOR;  Surgeon: Nestor Lewandowsky, MD;  Location: [facility] ORS;  Service: Thoracic;  Laterality: Left;   TONSILLECTOMY      TRIGGER FINGER RELEASE Left     FAMILY HISTORY :   Family History  Problem Relation Age of Onset   Breast cancer Maternal Aunt 77       great aunt   Prostate cancer Father    Hypertension Father    Heart disease Father    Alzheimer's disease Mother    Heart disease Mother    Hypertension Mother    Colon polyps Mother    Depression Brother        brother #2    SOCIAL HISTORY:   Social History   Tobacco Use   Smoking status: Former    Packs/day: 1.00    Years: 45.00    Total pack years: 45.00    Types: Cigarettes    Quit date: 07/26/2017    Years since quitting: 4.2   Smokeless tobacco: Never  Vaping Use   Vaping Use: Never used  Substance Use Topics   Alcohol use: No    Alcohol/week: 0.0 standard drinks of alcohol   Drug use: No    ALLERGIES:  is  allergic to erythromycin, septra ds [sulfamethoxazole-trimethoprim], sulfa antibiotics, sulfasalazine, tetracyclines & related, aspirin, demeclocycline, and latex.  MEDICATIONS:  Current Outpatient Medications  Medication Sig Dispense Refill   acetaminophen (TYLENOL) 500 MG tablet Take 500-1,000 mg by mouth 2 (two) times daily as needed for moderate pain (FOR HEADACHES/BACK PAIN.).      albuterol (PROVENTIL HFA;VENTOLIN HFA) 108 (90 BASE) MCG/ACT inhaler Inhale 2 puffs into the lungs every 6 (six) hours as needed for wheezing or shortness of breath.      ALPRAZolam (XANAX) 0.25 MG tablet Take 0.25 mg by mouth 3 (three) times daily as needed for anxiety.      atenolol (TENORMIN) 50 MG tablet Take 50 mg by mouth daily.     atorvastatin (LIPITOR) 10 MG tablet Take 1 tablet (10 mg total) by mouth daily. 90 tablet 3   Calcium Carbonate-Vitamin D (CALTRATE 600+D PO) Take 1 tablet by mouth daily. Chewable     ezetimibe (ZETIA) 10 MG tablet Take 1 tablet (10 mg total) by mouth daily. 90 tablet 3   hydroxypropyl methylcellulose / hypromellose (ISOPTO TEARS / GONIOVISC) 2.5 % ophthalmic solution Place 1 drop into both eyes as needed  for dry eyes.     Lifitegrast 5 % SOLN Place 1 drop into both eyes 2 (two) times daily as needed (for excessively dry eyes.).     Multiple Vitamins-Minerals (PRESERVISION AREDS 2+MULTI VIT PO) Take by mouth in the morning and at bedtime.     pantoprazole (PROTONIX) 40 MG tablet Take 40 mg by mouth daily.     Polyethyl Glyc-Propyl Glyc PF (SYSTANE ULTRA PF) 0.4-0.3 % SOLN Apply 1 drop to eye as needed (both eye for dry eyes).     pregabalin (LYRICA) 25 MG capsule Take 25 mg by mouth 2 (two) times daily.     Propylene Glycol-Glycerin 0.6-0.6 % SOLN Place 1-2 drops into both eyes 5 (five) times daily as needed (for dry eyes.). SOOTHE XP     spironolactone (ALDACTONE) 50 MG tablet Take 25 mg by mouth daily.      traMADol (ULTRAM) 50 MG tablet Take 25-50 mg by mouth 2 (two) times daily as needed for moderate pain.     vitamin B-12 (CYANOCOBALAMIN) 1000 MCG tablet Take 1,000 mcg by mouth daily.     vitamin E 1000 UNIT capsule Take 1,000 Units by mouth daily.     zolpidem (AMBIEN) 10 MG tablet Take 10 mg by mouth at bedtime as needed for sleep.     denosumab (PROLIA) 60 MG/ML SOSY injection Inject 60 mg into the skin every 6 (six) months. (Patient not taking: Reported on 10/21/2021)     eletriptan (RELPAX) 40 MG tablet Take 40 mg by mouth as needed for migraine or headache. May repeat in 2 hours if headache persists or recurs. (Patient not taking: Reported on 05/17/2021)     No current facility-administered medications for this visit.    PHYSICAL EXAMINATION: ECOG PERFORMANCE STATUS: 1 - Symptomatic but completely ambulatory  BP 121/67 (BP Location: Left Arm, Patient Position: Sitting)   Pulse (!) 58   Temp (!) 96.3 F (35.7 C) (Tympanic)   Resp 16   Wt 117 lb (53.1 kg)   SpO2 99%   BMI 19.47 kg/m   Filed Weights   11/04/21 1339  Weight: 117 lb (53.1 kg)    Physical Exam HENT:     Head: Normocephalic and atraumatic.     Mouth/Throat:     Pharynx: No oropharyngeal exudate.  Eyes:  Pupils: Pupils are equal, round, and reactive to light.  Cardiovascular:     Rate and Rhythm: Normal rate and regular rhythm.  Pulmonary:     Effort: No respiratory distress.     Breath sounds: No wheezing.  Abdominal:     General: Bowel sounds are normal. There is no distension.     Palpations: Abdomen is soft. There is no mass.     Tenderness: There is no abdominal tenderness. There is no guarding or rebound.  Musculoskeletal:        General: No tenderness. Normal range of motion.     Cervical back: Normal range of motion and neck supple.  Skin:    General: Skin is warm.     Comments:    Neurological:     Mental Status: She is alert and oriented to person, place, and time.  Psychiatric:        Mood and Affect: Affect normal.        LABORATORY DATA:  I have reviewed the data as listed    Component Value Date/Time   NA 140 11/04/2021 1307   NA 136 02/06/2012 0957   K 4.1 11/04/2021 1307   K 3.8 02/06/2012 0957   CL 105 11/04/2021 1307   CO2 30 11/04/2021 1307   GLUCOSE 97 11/04/2021 1307   BUN 9 11/04/2021 1307   CREATININE 0.80 11/04/2021 1307   CREATININE 0.97 11/12/2013 1508   CALCIUM 9.7 11/04/2021 1307   CALCIUM 9.5 05/08/2012 1018   PROT 7.2 11/04/2021 1307   PROT 7.2 09/24/2020 1159   PROT 7.6 11/12/2013 1508   ALBUMIN 4.2 11/04/2021 1307   ALBUMIN 4.4 09/24/2020 1159   ALBUMIN 3.7 11/12/2013 1508   AST 17 11/04/2021 1307   AST 10 (L) 11/12/2013 1508   ALT 11 11/04/2021 1307   ALT 15 11/12/2013 1508   ALKPHOS 35 (L) 11/04/2021 1307   ALKPHOS 49 11/12/2013 1508   BILITOT 0.9 11/04/2021 1307   BILITOT 0.8 09/24/2020 1159   BILITOT 0.7 11/12/2013 1508   GFRNONAA >60 11/04/2021 1307   GFRNONAA >60 11/12/2013 1508   GFRAA >60 03/06/2018 1302   GFRAA >60 11/12/2013 1508    No results found for: "SPEP", "UPEP"  Lab Results  Component Value Date   WBC 5.7 11/04/2021   NEUTROABS 2.6 11/04/2021   HGB 14.4 11/04/2021   HCT 42.8 11/04/2021   MCV 94.1  11/04/2021   PLT 228 11/04/2021      Chemistry      Component Value Date/Time   NA 140 11/04/2021 1307   NA 136 02/06/2012 0957   K 4.1 11/04/2021 1307   K 3.8 02/06/2012 0957   CL 105 11/04/2021 1307   CO2 30 11/04/2021 1307   BUN 9 11/04/2021 1307   CREATININE 0.80 11/04/2021 1307   CREATININE 0.97 11/12/2013 1508      Component Value Date/Time   CALCIUM 9.7 11/04/2021 1307   CALCIUM 9.5 05/08/2012 1018   ALKPHOS 35 (L) 11/04/2021 1307   ALKPHOS 49 11/12/2013 1508   AST 17 11/04/2021 1307   AST 10 (L) 11/12/2013 1508   ALT 11 11/04/2021 1307   ALT 15 11/12/2013 1508   BILITOT 0.9 11/04/2021 1307   BILITOT 0.8 09/24/2020 1159   BILITOT 0.7 11/12/2013 1508       RADIOGRAPHIC STUDIES: I have personally reviewed the radiological images as listed and agreed with the findings in the report. No results found.   ASSESSMENT & PLAN:  No problem-specific Assessment &  Plan notes found for this encounter.  Encounter Diagnoses  Name Primary?   Primary cancer of left upper lobe of lung ([place]) Yes   Carcinoma of overlapping sites of left breast in female, estrogen receptor positive ([place])    #Left upper lobe adenocarcinoma stage I; June 2022- CT chest NED; Chronic and stable. No clinical signs of recurrence. Reviewed recent Chest CT which was without new findings. 6 month follow up in clinic with yearly CT Chest scan.    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 Stable.    # History of breast cancer stage I; DEC 2022- Mammo-WNL- Chronic and stable. Continue yearly monitoring.     No orders of the defined types were placed in this encounter.  All questions were answered. The patient knows to call the clinic with any problems, questions or concerns.      Hughie Closs, PA-C 11/04/2021 1:59 PM

## 2021-11-16 ENCOUNTER — Other Ambulatory Visit: Payer: Self-pay | Admitting: Infectious Diseases

## 2021-11-16 ENCOUNTER — Ambulatory Visit
Admission: RE | Admit: 2021-11-16 | Discharge: 2021-11-16 | Disposition: A | Discharge status: Home / Self Care | Payer: Medicare Other | Source: Ambulatory Visit | Attending: Infectious Diseases | Admitting: Infectious Diseases

## 2021-11-16 DIAGNOSIS — M7989 Other specified soft tissue disorders: Secondary | ICD-10-CM | POA: Diagnosis not present

## 2021-11-16 DIAGNOSIS — C3412 Malignant neoplasm of upper lobe, left bronchus or lung: Secondary | ICD-10-CM | POA: Diagnosis present

## 2021-12-11 ENCOUNTER — Inpatient Hospital Stay
Admission: EM | Admit: 2021-12-11 | Discharge: 2021-12-14 | DRG: 482 | Disposition: A | Discharge status: Home Health | Payer: Medicare Other | Attending: Internal Medicine | Admitting: Internal Medicine

## 2021-12-11 ENCOUNTER — Emergency Department: Payer: Medicare Other

## 2021-12-11 ENCOUNTER — Other Ambulatory Visit: Payer: Self-pay

## 2021-12-11 ENCOUNTER — Encounter
Admission: EM | Disposition: A | Discharge status: Home Health | Payer: Self-pay | Source: Home / Self Care | Attending: Internal Medicine

## 2021-12-11 ENCOUNTER — Inpatient Hospital Stay: Payer: Medicare Other

## 2021-12-11 ENCOUNTER — Inpatient Hospital Stay: Payer: Medicare Other | Admitting: Anesthesiology

## 2021-12-11 DIAGNOSIS — F32A Depression, unspecified: Secondary | ICD-10-CM | POA: Diagnosis present

## 2021-12-11 DIAGNOSIS — H9192 Unspecified hearing loss, left ear: Secondary | ICD-10-CM | POA: Diagnosis present

## 2021-12-11 DIAGNOSIS — Y92009 Unspecified place in unspecified non-institutional (private) residence as the place of occurrence of the external cause: Secondary | ICD-10-CM

## 2021-12-11 DIAGNOSIS — S72001S Fracture of unspecified part of neck of right femur, sequela: Secondary | ICD-10-CM | POA: Diagnosis not present

## 2021-12-11 DIAGNOSIS — M4802 Spinal stenosis, cervical region: Secondary | ICD-10-CM | POA: Diagnosis present

## 2021-12-11 DIAGNOSIS — Z23 Encounter for immunization: Secondary | ICD-10-CM | POA: Diagnosis present

## 2021-12-11 DIAGNOSIS — I10 Essential (primary) hypertension: Secondary | ICD-10-CM | POA: Diagnosis present

## 2021-12-11 DIAGNOSIS — Z85118 Personal history of other malignant neoplasm of bronchus and lung: Secondary | ICD-10-CM

## 2021-12-11 DIAGNOSIS — K219 Gastro-esophageal reflux disease without esophagitis: Secondary | ICD-10-CM | POA: Diagnosis present

## 2021-12-11 DIAGNOSIS — Y92003 Bedroom of unspecified non-institutional (private) residence as the place of occurrence of the external cause: Secondary | ICD-10-CM

## 2021-12-11 DIAGNOSIS — Z888 Allergy status to other drugs, medicaments and biological substances status: Secondary | ICD-10-CM | POA: Diagnosis not present

## 2021-12-11 DIAGNOSIS — E782 Mixed hyperlipidemia: Secondary | ICD-10-CM | POA: Diagnosis present

## 2021-12-11 DIAGNOSIS — Z87891 Personal history of nicotine dependence: Secondary | ICD-10-CM

## 2021-12-11 DIAGNOSIS — Z881 Allergy status to other antibiotic agents status: Secondary | ICD-10-CM

## 2021-12-11 DIAGNOSIS — W06XXXA Fall from bed, initial encounter: Secondary | ICD-10-CM | POA: Diagnosis present

## 2021-12-11 DIAGNOSIS — F419 Anxiety disorder, unspecified: Secondary | ICD-10-CM | POA: Diagnosis present

## 2021-12-11 DIAGNOSIS — I959 Hypotension, unspecified: Secondary | ICD-10-CM | POA: Diagnosis not present

## 2021-12-11 DIAGNOSIS — S72144A Nondisplaced intertrochanteric fracture of right femur, initial encounter for closed fracture: Principal | ICD-10-CM | POA: Diagnosis present

## 2021-12-11 DIAGNOSIS — Z886 Allergy status to analgesic agent status: Secondary | ICD-10-CM | POA: Diagnosis not present

## 2021-12-11 DIAGNOSIS — Z9049 Acquired absence of other specified parts of digestive tract: Secondary | ICD-10-CM | POA: Diagnosis not present

## 2021-12-11 DIAGNOSIS — K2970 Gastritis, unspecified, without bleeding: Secondary | ICD-10-CM | POA: Diagnosis present

## 2021-12-11 DIAGNOSIS — M810 Age-related osteoporosis without current pathological fracture: Secondary | ICD-10-CM | POA: Diagnosis present

## 2021-12-11 DIAGNOSIS — Z8249 Family history of ischemic heart disease and other diseases of the circulatory system: Secondary | ICD-10-CM | POA: Diagnosis not present

## 2021-12-11 DIAGNOSIS — Z91040 Latex allergy status: Secondary | ICD-10-CM

## 2021-12-11 DIAGNOSIS — Z882 Allergy status to sulfonamides status: Secondary | ICD-10-CM

## 2021-12-11 DIAGNOSIS — Z79899 Other long term (current) drug therapy: Secondary | ICD-10-CM

## 2021-12-11 DIAGNOSIS — Z853 Personal history of malignant neoplasm of breast: Secondary | ICD-10-CM | POA: Diagnosis not present

## 2021-12-11 DIAGNOSIS — E042 Nontoxic multinodular goiter: Secondary | ICD-10-CM | POA: Diagnosis present

## 2021-12-11 DIAGNOSIS — W19XXXA Unspecified fall, initial encounter: Secondary | ICD-10-CM | POA: Diagnosis not present

## 2021-12-11 DIAGNOSIS — Z9012 Acquired absence of left breast and nipple: Secondary | ICD-10-CM | POA: Diagnosis not present

## 2021-12-11 DIAGNOSIS — S72009A Fracture of unspecified part of neck of unspecified femur, initial encounter for closed fracture: Secondary | ICD-10-CM

## 2021-12-11 DIAGNOSIS — J449 Chronic obstructive pulmonary disease, unspecified: Secondary | ICD-10-CM | POA: Diagnosis present

## 2021-12-11 DIAGNOSIS — Z923 Personal history of irradiation: Secondary | ICD-10-CM | POA: Diagnosis not present

## 2021-12-11 DIAGNOSIS — S7291XA Unspecified fracture of right femur, initial encounter for closed fracture: Secondary | ICD-10-CM

## 2021-12-11 DIAGNOSIS — Z803 Family history of malignant neoplasm of breast: Secondary | ICD-10-CM

## 2021-12-11 HISTORY — PX: INTRAMEDULLARY (IM) NAIL INTERTROCHANTERIC: SHX5875

## 2021-12-11 LAB — CBC WITH DIFFERENTIAL/PLATELET
Abs Immature Granulocytes: 0.02 10*3/uL (ref 0.00–0.07)
Basophils Absolute: 0 10*3/uL (ref 0.0–0.1)
Basophils Relative: 0 %
Eosinophils Absolute: 0.2 10*3/uL (ref 0.0–0.5)
Eosinophils Relative: 2 %
HCT: 38.1 % (ref 36.0–46.0)
Hemoglobin: 12.6 g/dL (ref 12.0–15.0)
Immature Granulocytes: 0 %
Lymphocytes Relative: 18 %
Lymphs Abs: 1.4 10*3/uL (ref 0.7–4.0)
MCH: 30.5 pg (ref 26.0–34.0)
MCHC: 33.1 g/dL (ref 30.0–36.0)
MCV: 92.3 fL (ref 80.0–100.0)
Monocytes Absolute: 0.7 10*3/uL (ref 0.1–1.0)
Monocytes Relative: 10 %
Neutro Abs: 5.2 10*3/uL (ref 1.7–7.7)
Neutrophils Relative %: 70 %
Platelets: 202 10*3/uL (ref 150–400)
RBC: 4.13 MIL/uL (ref 3.87–5.11)
RDW: 13 % (ref 11.5–15.5)
WBC: 7.5 10*3/uL (ref 4.0–10.5)
nRBC: 0 % (ref 0.0–0.2)

## 2021-12-11 LAB — COMPREHENSIVE METABOLIC PANEL
ALT: 13 U/L (ref 0–44)
AST: 15 U/L (ref 15–41)
Albumin: 3.9 g/dL (ref 3.5–5.0)
Alkaline Phosphatase: 39 U/L (ref 38–126)
Anion gap: 6 (ref 5–15)
BUN: 10 mg/dL (ref 8–23)
CO2: 26 mmol/L (ref 22–32)
Calcium: 9 mg/dL (ref 8.9–10.3)
Chloride: 108 mmol/L (ref 98–111)
Creatinine, Ser: 0.8 mg/dL (ref 0.44–1.00)
GFR, Estimated: >60 mL/min (ref >60)
Glucose, Bld: 112 mg/dL — ABNORMAL HIGH (ref 70–99)
Potassium: 3.5 mmol/L (ref 3.5–5.1)
Sodium: 140 mmol/L (ref 135–145)
Total Bilirubin: 1.1 mg/dL (ref 0.3–1.2)
Total Protein: 6.8 g/dL (ref 6.5–8.1)

## 2021-12-11 LAB — CBC
HCT: 38.6 % (ref 36.0–46.0)
Hemoglobin: 13 g/dL (ref 12.0–15.0)
MCH: 31.2 pg (ref 26.0–34.0)
MCHC: 33.7 g/dL (ref 30.0–36.0)
MCV: 92.6 fL (ref 80.0–100.0)
Platelets: 188 10*3/uL (ref 150–400)
RBC: 4.17 MIL/uL (ref 3.87–5.11)
RDW: 13 % (ref 11.5–15.5)
WBC: 9.1 10*3/uL (ref 4.0–10.5)
nRBC: 0 % (ref 0.0–0.2)

## 2021-12-11 LAB — CREATININE, SERUM
Creatinine, Ser: 0.72 mg/dL (ref 0.44–1.00)
GFR, Estimated: >60 mL/min (ref >60)

## 2021-12-11 LAB — PROTIME-INR
INR: 1.1 (ref 0.8–1.2)
Prothrombin Time: 14.2 seconds (ref 11.4–15.2)

## 2021-12-11 LAB — APTT: aPTT: 23 seconds — ABNORMAL LOW (ref 24–36)

## 2021-12-11 SURGERY — FIXATION, FRACTURE, INTERTROCHANTERIC, WITH INTRAMEDULLARY ROD
Anesthesia: General | Laterality: Right

## 2021-12-11 MED ORDER — ALPRAZOLAM 0.25 MG PO TABS
0.2500 mg | ORAL_TABLET | Freq: Three times a day (TID) | ORAL | Status: DC | PRN
Start: 2021-12-11 — End: 2021-12-14
  Administered 2021-12-12 – 2021-12-13 (×2): 0.25 mg via ORAL
  Filled 2021-12-11 (×2): qty 1

## 2021-12-11 MED ORDER — LORAZEPAM 2 MG/ML IJ SOLN: 0.2500 mg | Freq: Once | INTRAMUSCULAR | Status: DC | PRN

## 2021-12-11 MED ORDER — MORPHINE SULFATE (PF) 2 MG/ML IV SOLN: 0.5000 mg | INTRAVENOUS | Status: DC | PRN

## 2021-12-11 MED ORDER — OXYCODONE HCL 5 MG/5ML PO SOLN: 5.0000 mg | Freq: Once | ORAL | Status: DC | PRN

## 2021-12-11 MED ORDER — CEFAZOLIN SODIUM-DEXTROSE 2-4 GM/100ML-% IV SOLN
2.0000 g | Freq: Three times a day (TID) | INTRAVENOUS | Status: AC
  Administered 2021-12-11 – 2021-12-12 (×3): 2 g via INTRAVENOUS
  Filled 2021-12-11 (×3): qty 100

## 2021-12-11 MED ORDER — SODIUM CHLORIDE 0.9 % IV SOLN
INTRAVENOUS | Status: DC | PRN
  Administered 2021-12-11: 10 mL/h via INTRAVENOUS

## 2021-12-11 MED ORDER — PROPYLENE GLYCOL-GLYCERIN 0.6-0.6 % OP SOLN: 1.0000 [drp] | Freq: Every day | OPHTHALMIC | Status: DC | PRN

## 2021-12-11 MED ORDER — EZETIMIBE 10 MG PO TABS
10.0000 mg | ORAL_TABLET | Freq: Every day | ORAL | Status: DC
  Administered 2021-12-11 – 2021-12-12 (×2): 10 mg via ORAL
  Filled 2021-12-11 (×2): qty 1

## 2021-12-11 MED ORDER — MIDAZOLAM HCL 5 MG/5ML IJ SOLN
INTRAMUSCULAR | Status: DC | PRN
  Administered 2021-12-11: 2 mg via INTRAVENOUS

## 2021-12-11 MED ORDER — ZOLPIDEM TARTRATE 5 MG PO TABS: 5.0000 mg | ORAL_TABLET | Freq: Every evening | ORAL | Status: DC | PRN

## 2021-12-11 MED ORDER — LACTATED RINGERS IV SOLN: INTRAVENOUS | Status: DC

## 2021-12-11 MED ORDER — EPHEDRINE 5 MG/ML INJ
INTRAVENOUS | Status: AC
  Filled 2021-12-11: qty 5

## 2021-12-11 MED ORDER — ACETAMINOPHEN 325 MG PO TABS: 325.0000 mg | ORAL_TABLET | Freq: Four times a day (QID) | ORAL | Status: DC | PRN

## 2021-12-11 MED ORDER — MENTHOL 3 MG MT LOZG: 1.0000 | LOZENGE | OROMUCOSAL | Status: DC | PRN

## 2021-12-11 MED ORDER — MORPHINE SULFATE (PF) 2 MG/ML IV SOLN: 1.0000 mg | INTRAVENOUS | Status: DC | PRN

## 2021-12-11 MED ORDER — ONDANSETRON HCL 4 MG/2ML IJ SOLN: 4.0000 mg | Freq: Four times a day (QID) | INTRAMUSCULAR | Status: DC | PRN

## 2021-12-11 MED ORDER — PANTOPRAZOLE SODIUM 40 MG PO TBEC
40.0000 mg | DELAYED_RELEASE_TABLET | Freq: Every day | ORAL | Status: DC
  Administered 2021-12-12 – 2021-12-14 (×3): 40 mg via ORAL
  Filled 2021-12-11 (×4): qty 1

## 2021-12-11 MED ORDER — HYDROCODONE-ACETAMINOPHEN 7.5-325 MG PO TABS
1.0000 | ORAL_TABLET | ORAL | Status: DC | PRN
  Administered 2021-12-11: 2 via ORAL
  Filled 2021-12-11: qty 2

## 2021-12-11 MED ORDER — FENTANYL CITRATE PF 50 MCG/ML IJ SOSY
25.0000 ug | PREFILLED_SYRINGE | Freq: Once | INTRAMUSCULAR | Status: AC | PRN
  Administered 2021-12-11: 25 ug via INTRAVENOUS
  Filled 2021-12-11: qty 1

## 2021-12-11 MED ORDER — EPHEDRINE SULFATE (PRESSORS) 50 MG/ML IJ SOLN
INTRAMUSCULAR | Status: DC | PRN
  Administered 2021-12-11: 10 mg via INTRAVENOUS

## 2021-12-11 MED ORDER — MAGNESIUM HYDROXIDE 400 MG/5ML PO SUSP
30.0000 mL | Freq: Every day | ORAL | Status: DC | PRN
  Filled 2021-12-11: qty 30

## 2021-12-11 MED ORDER — FLEET ENEMA 7-19 GM/118ML RE ENEM: 1.0000 | ENEMA | Freq: Once | RECTAL | Status: DC | PRN

## 2021-12-11 MED ORDER — ATENOLOL 25 MG PO TABS
50.0000 mg | ORAL_TABLET | Freq: Every day | ORAL | Status: DC
  Administered 2021-12-12: 50 mg via ORAL
  Filled 2021-12-11: qty 2

## 2021-12-11 MED ORDER — SODIUM CHLORIDE 0.45 % IV SOLN: INTRAVENOUS | Status: DC

## 2021-12-11 MED ORDER — PHENYLEPHRINE HCL (PRESSORS) 10 MG/ML IV SOLN
INTRAVENOUS | Status: DC | PRN
  Administered 2021-12-11: 80 ug via INTRAVENOUS
  Administered 2021-12-11: 160 ug via INTRAVENOUS
  Administered 2021-12-11 (×3): 80 ug via INTRAVENOUS

## 2021-12-11 MED ORDER — METOCLOPRAMIDE HCL 5 MG/ML IJ SOLN: 5.0000 mg | Freq: Three times a day (TID) | INTRAMUSCULAR | Status: DC | PRN

## 2021-12-11 MED ORDER — PROPOFOL 500 MG/50ML IV EMUL
INTRAVENOUS | Status: DC | PRN
  Administered 2021-12-11: 50 ug/kg/min via INTRAVENOUS

## 2021-12-11 MED ORDER — FERROUS SULFATE 325 (65 FE) MG PO TABS
325.0000 mg | ORAL_TABLET | Freq: Every day | ORAL | Status: DC
  Administered 2021-12-12 – 2021-12-14 (×3): 325 mg via ORAL
  Filled 2021-12-11 (×3): qty 1

## 2021-12-11 MED ORDER — TRANEXAMIC ACID-NACL 1000-0.7 MG/100ML-% IV SOLN: 1000.0000 mg | Freq: Once | INTRAVENOUS | Status: DC

## 2021-12-11 MED ORDER — OXYCODONE HCL 5 MG PO TABS: 5.0000 mg | ORAL_TABLET | Freq: Once | ORAL | Status: DC | PRN

## 2021-12-11 MED ORDER — DEXMEDETOMIDINE (PRECEDEX) IN NS 20 MCG/5ML (4 MCG/ML) IV SYRINGE
PREFILLED_SYRINGE | INTRAVENOUS | Status: DC | PRN
  Administered 2021-12-11: 8 ug via INTRAVENOUS

## 2021-12-11 MED ORDER — OXYCODONE HCL 5 MG PO TABS: 5.0000 mg | ORAL_TABLET | ORAL | Status: DC | PRN

## 2021-12-11 MED ORDER — SODIUM CHLORIDE 0.9 % IV SOLN: INTRAVENOUS | Status: DC

## 2021-12-11 MED ORDER — SENNA 8.6 MG PO TABS
1.0000 | ORAL_TABLET | Freq: Two times a day (BID) | ORAL | Status: DC
  Administered 2021-12-11 – 2021-12-14 (×6): 8.6 mg via ORAL
  Filled 2021-12-11 (×6): qty 1

## 2021-12-11 MED ORDER — FENTANYL CITRATE (PF) 100 MCG/2ML IJ SOLN: 25.0000 ug | INTRAMUSCULAR | Status: DC | PRN

## 2021-12-11 MED ORDER — BISACODYL 10 MG RE SUPP: 10.0000 mg | Freq: Every day | RECTAL | Status: DC | PRN

## 2021-12-11 MED ORDER — PREGABALIN 25 MG PO CAPS
25.0000 mg | ORAL_CAPSULE | Freq: Two times a day (BID) | ORAL | Status: DC
  Administered 2021-12-11 – 2021-12-14 (×6): 25 mg via ORAL
  Filled 2021-12-11 (×6): qty 1

## 2021-12-11 MED ORDER — ENOXAPARIN SODIUM 40 MG/0.4ML IJ SOSY: 40.0000 mg | PREFILLED_SYRINGE | INTRAMUSCULAR | Status: DC

## 2021-12-11 MED ORDER — TRANEXAMIC ACID-NACL 1000-0.7 MG/100ML-% IV SOLN
1000.0000 mg | INTRAVENOUS | Status: AC
  Administered 2021-12-11: 1000 mg via INTRAVENOUS
  Filled 2021-12-11: qty 100

## 2021-12-11 MED ORDER — BUPIVACAINE-EPINEPHRINE (PF) 0.25% -1:200000 IJ SOLN
INTRAMUSCULAR | Status: DC | PRN
  Administered 2021-12-11: 30 mL via PERINEURAL

## 2021-12-11 MED ORDER — BUPIVACAINE HCL (PF) 0.5 % IJ SOLN
INTRAMUSCULAR | Status: DC | PRN
  Administered 2021-12-11: 2.5 mL via INTRATHECAL

## 2021-12-11 MED ORDER — POLYETHYLENE GLYCOL 3350 17 G PO PACK
17.0000 g | PACK | Freq: Every day | ORAL | Status: DC | PRN
  Administered 2021-12-11: 17 g via ORAL
  Filled 2021-12-11: qty 1

## 2021-12-11 MED ORDER — LIFITEGRAST 5 % OP SOLN: 1.0000 [drp] | Freq: Two times a day (BID) | OPHTHALMIC | Status: DC | PRN

## 2021-12-11 MED ORDER — ENOXAPARIN SODIUM 30 MG/0.3ML IJ SOSY
30.0000 mg | PREFILLED_SYRINGE | INTRAMUSCULAR | Status: DC
  Administered 2021-12-12 – 2021-12-14 (×3): 30 mg via SUBCUTANEOUS
  Filled 2021-12-11 (×3): qty 0.3

## 2021-12-11 MED ORDER — HYDROCODONE-ACETAMINOPHEN 5-325 MG PO TABS: 1.0000 | ORAL_TABLET | ORAL | Status: DC | PRN

## 2021-12-11 MED ORDER — ACETAMINOPHEN 10 MG/ML IV SOLN: 1000.0000 mg | Freq: Once | INTRAVENOUS | Status: DC | PRN

## 2021-12-11 MED ORDER — ATORVASTATIN CALCIUM 10 MG PO TABS
10.0000 mg | ORAL_TABLET | Freq: Every day | ORAL | Status: DC
  Administered 2021-12-11 – 2021-12-12 (×2): 10 mg via ORAL
  Filled 2021-12-11 (×2): qty 1

## 2021-12-11 MED ORDER — METOCLOPRAMIDE HCL 5 MG PO TABS
5.0000 mg | ORAL_TABLET | Freq: Three times a day (TID) | ORAL | Status: DC | PRN
  Administered 2021-12-12 – 2021-12-13 (×2): 10 mg via ORAL
  Filled 2021-12-11 (×2): qty 2

## 2021-12-11 MED ORDER — CEFAZOLIN SODIUM-DEXTROSE 2-4 GM/100ML-% IV SOLN
2.0000 g | INTRAVENOUS | Status: AC
  Administered 2021-12-11: 2 g via INTRAVENOUS
  Filled 2021-12-11: qty 100

## 2021-12-11 MED ORDER — PROPOFOL 1000 MG/100ML IV EMUL
INTRAVENOUS | Status: AC
  Filled 2021-12-11: qty 100

## 2021-12-11 MED ORDER — SODIUM CHLORIDE 0.9 % IR SOLN
Status: DC | PRN
  Administered 2021-12-11: 500 mL

## 2021-12-11 MED ORDER — ACETAMINOPHEN 325 MG PO TABS
325.0000 mg | ORAL_TABLET | Freq: Four times a day (QID) | ORAL | Status: DC | PRN
  Administered 2021-12-11 – 2021-12-12 (×2): 650 mg via ORAL
  Filled 2021-12-11 (×2): qty 2

## 2021-12-11 MED ORDER — ONDANSETRON HCL 4 MG/2ML IJ SOLN
4.0000 mg | Freq: Once | INTRAMUSCULAR | Status: DC | PRN
Start: 2021-12-11 — End: 2021-12-11

## 2021-12-11 MED ORDER — PROPOFOL 10 MG/ML IV BOLUS
INTRAVENOUS | Status: DC | PRN
  Administered 2021-12-11 (×3): 30 mg via INTRAVENOUS

## 2021-12-11 MED ORDER — TETANUS-DIPHTH-ACELL PERTUSSIS 5-2.5-18.5 LF-MCG/0.5 IM SUSY
0.5000 mL | PREFILLED_SYRINGE | Freq: Once | INTRAMUSCULAR | Status: AC
  Administered 2021-12-11: 0.5 mL via INTRAMUSCULAR
  Filled 2021-12-11: qty 0.5

## 2021-12-11 MED ORDER — SPIRONOLACTONE 25 MG PO TABS
25.0000 mg | ORAL_TABLET | Freq: Every day | ORAL | Status: DC
  Administered 2021-12-12 – 2021-12-13 (×2): 25 mg via ORAL
  Filled 2021-12-11 (×2): qty 1

## 2021-12-11 MED ORDER — OXYCODONE-ACETAMINOPHEN 5-325 MG PO TABS
1.0000 | ORAL_TABLET | Freq: Once | ORAL | Status: AC
  Administered 2021-12-11: 1 via ORAL
  Filled 2021-12-11: qty 1

## 2021-12-11 MED ORDER — ALUM & MAG HYDROXIDE-SIMETH 200-200-20 MG/5ML PO SUSP: 30.0000 mL | ORAL | Status: DC | PRN

## 2021-12-11 MED ORDER — ONDANSETRON HCL 4 MG PO TABS
4.0000 mg | ORAL_TABLET | Freq: Four times a day (QID) | ORAL | Status: DC | PRN
  Administered 2021-12-11 – 2021-12-12 (×2): 4 mg via ORAL
  Filled 2021-12-11 (×2): qty 1

## 2021-12-11 MED ORDER — CHLORHEXIDINE GLUCONATE 4 % EX LIQD: Freq: Once | CUTANEOUS | Status: DC

## 2021-12-11 MED ORDER — MIDAZOLAM HCL 2 MG/2ML IJ SOLN
INTRAMUSCULAR | Status: AC
  Filled 2021-12-11: qty 2

## 2021-12-11 MED ORDER — POLYVINYL ALCOHOL 1.4 % OP SOLN: 1.0000 [drp] | OPHTHALMIC | Status: DC | PRN

## 2021-12-11 MED ORDER — PHENOL 1.4 % MT LIQD: 1.0000 | OROMUCOSAL | Status: DC | PRN

## 2021-12-11 SURGICAL SUPPLY — 43 items
BIT DRILL CALIBRATED 4.2 (BIT) IMPLANT
BIT DRILL CANN 16 HIP (BIT) IMPLANT
BIT DRILL CANN STP 6/9 HIP (BIT) IMPLANT
BIT DRILL TAPERED 10 (BIT) IMPLANT
BLADE TFNA HELICAL 90 STERILE (Anchor) IMPLANT
BNDG COHESIVE 4X5 TAN STRL LF (GAUZE/BANDAGES/DRESSINGS) ×1 IMPLANT
CHLORAPREP W/TINT 26 (MISCELLANEOUS) ×2 IMPLANT
DRAPE INCISE 23X17 IOBAN STRL (DRAPES) ×1
DRAPE INCISE 23X17 STRL (DRAPES) ×1 IMPLANT
DRAPE INCISE IOBAN 23X17 STRL (DRAPES) ×1 IMPLANT
DRILL BIT CALIBRATED 4.2 (BIT) ×1
DRSG AQUACEL AG ADV 3.5X14 (GAUZE/BANDAGES/DRESSINGS) IMPLANT
DRSG MEPILEX SACRM 8.7X9.8 (GAUZE/BANDAGES/DRESSINGS) IMPLANT
ELECT REM PT RETURN 9FT ADLT (ELECTROSURGICAL) ×1
ELECTRODE REM PT RTRN 9FT ADLT (ELECTROSURGICAL) ×1 IMPLANT
GAUZE SPONGE 4X4 12PLY STRL (GAUZE/BANDAGES/DRESSINGS) ×1 IMPLANT
GLOVE SURG ORTHO 8.5 STRL (GLOVE) ×1 IMPLANT
GLOVE SURG UNDER LTX SZ8 (GLOVE) ×1 IMPLANT
GOWN STRL REUS W/ TWL LRG LVL3 (GOWN DISPOSABLE) ×1 IMPLANT
GOWN STRL REUS W/TWL LRG LVL3 (GOWN DISPOSABLE) ×1
GOWN STRL REUS W/TWL LRG LVL4 (GOWN DISPOSABLE) ×1 IMPLANT
GUIDEWIRE 3.2X400 (WIRE) IMPLANT
KIT TURNOVER KIT A (KITS) ×1 IMPLANT
MANIFOLD NEPTUNE II (INSTRUMENTS) ×1 IMPLANT
MAT ABSORB  FLUID 56X50 GRAY (MISCELLANEOUS) ×1
MAT ABSORB FLUID 56X50 GRAY (MISCELLANEOUS) ×1 IMPLANT
NAIL TROCH FIX 10X170 130 (Nail) IMPLANT
NDL SPNL 18GX3.5 QUINCKE PK (NEEDLE) ×1 IMPLANT
NEEDLE SPNL 18GX3.5 QUINCKE PK (NEEDLE) ×1 IMPLANT
NS IRRIG 500ML POUR BTL (IV SOLUTION) ×1 IMPLANT
PACK HIP COMPR (MISCELLANEOUS) ×1 IMPLANT
SCREW LOCKING 5.0X34MM (Screw) IMPLANT
SOL PREP PVP 2OZ (MISCELLANEOUS) ×1
SOLUTION PREP PVP 2OZ (MISCELLANEOUS) ×1 IMPLANT
STAPLER SKIN PROX 35W (STAPLE) ×1 IMPLANT
SUCTION FRAZIER HANDLE 10FR (MISCELLANEOUS) ×1
SUCTION TUBE FRAZIER 10FR DISP (MISCELLANEOUS) ×1 IMPLANT
SUT VIC AB 0 CT1 36 (SUTURE) ×1 IMPLANT
SUT VIC AB 2-0 CT1 27 (SUTURE) ×1
SUT VIC AB 2-0 CT1 TAPERPNT 27 (SUTURE) ×1 IMPLANT
SYR 30ML LL (SYRINGE) ×1 IMPLANT
TRAP FLUID SMOKE EVACUATOR (MISCELLANEOUS) ×1 IMPLANT
WATER STERILE IRR 500ML POUR (IV SOLUTION) ×1 IMPLANT

## 2021-12-11 NOTE — Anesthesia Procedure Notes (Signed)
Spinal  Patient location during procedure: OR Start time: 12/11/2021 11:54 AM End time: 12/11/2021 11:55 AM Reason for block: surgical anesthesia Staffing Performed: resident/CRNA  Anesthesiologist: Darrin Nipper, MD Resident/CRNA: Aline Brochure, CRNA Performed by: Aline Brochure, CRNA Authorized by: Darrin Nipper, MD   Preanesthetic Checklist Completed: patient identified, IV checked, site marked, risks and benefits discussed, surgical consent, monitors and equipment checked, pre-op evaluation and timeout performed Spinal Block Patient position: sitting Prep: ChloraPrep Patient monitoring: heart rate, continuous pulse ox, blood pressure and cardiac monitor Approach: midline Location: L3-4 Injection technique: single-shot Needle Needle type: Introducer and Pencan  Needle gauge: 24 G Needle length: 9 cm Assessment Sensory level: T10 Events: CSF return Additional Notes Sterile aseptic technique used throughout the procedure.  Negative paresthesia. Negative blood return. Positive free-flowing CSF. Expiration date of kit checked and confirmed. Patient tolerated procedure well, without complications.

## 2021-12-11 NOTE — H&P (Signed)
History and Physical    Patient: Katie Franklin WGY:[id number] DOB: 09-Sep-1955 DOA: 12/11/2021 DOS: the patient was seen and examined on 12/11/2021 PCP: [name], MD  Patient coming from: Home  Chief Complaint:  Chief Complaint  Patient presents with   Fall   Hip Pain   HPI: [name] is a 66 y.o. female with medical history significant of hypertension, COPD, anxiety, prior tobacco abuse, breast cancer s/p lumpectomy with radiation, lung cancer s/p surgery, and GERD who presents after falling out of bed.  She had taken Ambien last night for sleep but does not take this medication regularly.  Overnight patient rolled over and fell out of bed laying onto her right hip.  She denied any trauma to her head or loss of consciousness, but did hit possibly hit her right knee sustaining an abrasion.  On admission to the emergency department patient was noted to be tachycardic with all other vital signs maintained.  Labs relatively unremarkable.  X-rays revealed nondisplaced intertrochanteric right femur fracture.  Dr. Sabra Heck of orthopedics was consulted.  Patient has been given Tdap booster, oxycodone 5 mg p.o., and fentanyl 25 mcg IV.  Review of Systems: As mentioned in the history of present illness. All other systems reviewed and are negative. Past Medical History:  Diagnosis Date   Breast cancer ([place]) 2012   left breast   COPD (chronic obstructive pulmonary disease) ([place])    Deafness in left ear    Depression    Diverticulosis    Ductal carcinoma of left breast ([place]) 2012   Family history of adverse reaction to anesthesia    mother got sick   Gastritis    GERD (gastroesophageal reflux disease)    H. pylori infection    H/O herpes labialis    Hemorrhoids    History of chicken pox    Hyperplastic colon polyp    Hypertension    Lung cancer ([place]) 08/2017   surgery only, no rad or chemo tx   Multinodular goiter (nontoxic)    Osteoporosis    Panic disorder    Personal  history of radiation therapy    Recurrent sinusitis    Recurrent sinusitis    Tobacco use    Tubular adenoma of colon    Past Surgical History:  Procedure Laterality Date   BACK SURGERY  2018   lumbar   BREAST EXCISIONAL BIOPSY Left    negative 1980's twice   BREAST EXCISIONAL [facility]SY Left 2012   [facility]   BREAST LUMPECTOMY Left 2012   [facility], clear margins, LN neg   BUNIONECTOMY Right    CHOLECYSTECTOMY N/A 06/11/2017   Procedure: LAPAROSCOPIC CHOLECYSTECTOMY;  Surgeon: Herbert Pun, MD;  Location: [facility] ORS;  Service: General;  Laterality: N/A;   COLONOSCOPY N/A 05/17/2021   Procedure: COLONOSCOPY;  Surgeon: Lesly Rubenstein, MD;  Location: [facility] ENDOSCOPY;  Service: Endoscopy;  Laterality: N/A;   COLONOSCOPY WITH PROPOFOL N/A 12/28/2014   Procedure: COLONOSCOPY WITH PROPOFOL;  Surgeon: Lollie Sails, MD;  Location: [facility] ENDOSCOPY;  Service: Endoscopy;  Laterality: N/A;   COLONOSCOPY WITH PROPOFOL N/A 08/17/2017   Procedure: COLONOSCOPY WITH PROPOFOL;  Surgeon: Lollie Sails, MD;  Location: [facility] ENDOSCOPY;  Service: Endoscopy;  Laterality: N/A;   cyst throat     base of tongue   ESOPHAGOGASTRODUODENOSCOPY     ESOPHAGOGASTRODUODENOSCOPY N/A 04/13/2020   Procedure: ESOPHAGOGASTRODUODENOSCOPY (EGD);  Surgeon: Lesly Rubenstein, MD;  Location: [facility] ENDOSCOPY;  Service: Endoscopy;  Laterality: N/A;   ESOPHAGOGASTRODUODENOSCOPY (EGD)  WITH PROPOFOL N/A 12/02/2015   Procedure: ESOPHAGOGASTRODUODENOSCOPY (EGD) WITH PROPOFOL;  Surgeon: Lollie Sails, MD;  Location: [facility] ENDOSCOPY;  Service: Endoscopy;  Laterality: N/A;   ESOPHAGOGASTRODUODENOSCOPY (EGD) WITH PROPOFOL N/A 04/13/2017   Procedure: ESOPHAGOGASTRODUODENOSCOPY (EGD) WITH PROPOFOL;  Surgeon: Lollie Sails, MD;  Location: [facility] ENDOSCOPY;  Service: Endoscopy;  Laterality: N/A;   EYE SURGERY  08/27/2019   cataract extraction right   EYE SURGERY  09/10/2019   cataract extraction left   FLEXIBLE BRONCHOSCOPY  N/A 08/27/2017   Procedure: PREOP BRONCHOSCOPY;  Surgeon: Nestor Lewandowsky, MD;  Location: [facility] ORS;  Service: Thoracic;  Laterality: N/A;   GANGLION CYST EXCISION Right    wrist   Incision tendon sheath for trigger finger Left    LUMBAR LAMINECTOMY/DECOMPRESSION MICRODISCECTOMY Right 11/06/2016   Procedure: RIGHT L5/S1 HEMILAMINECTOMY AND CYST RESECTION L5/S1;  Surgeon: Deetta Perla, MD;  Location: [facility] ORS;  Service: Neurosurgery;  Laterality: Right;   MASTECTOMY  07/2010   partial mastectomy left   MULTIPLE TOOTH EXTRACTIONS N/A    9 teeth   NASAL SEPTUM SURGERY     THORACOTOMY Left 08/27/2017   Procedure: THORACOTOMY MAJOR;  Surgeon: Nestor Lewandowsky, MD;  Location: [facility] ORS;  Service: Thoracic;  Laterality: Left;   TONSILLECTOMY     TRIGGER FINGER RELEASE Left    Social History:  reports that she quit smoking about 4 years ago. Her smoking use included cigarettes. She has a 45.00 pack-year smoking history. She has never used smokeless tobacco. She reports that she does not drink alcohol and does not use drugs.  Allergies  Allergen Reactions   Erythromycin Anaphylaxis and Rash   Septra Ds [Sulfamethoxazole-Trimethoprim] Anaphylaxis    Neck swelling as well    Sulfa Antibiotics Anaphylaxis   Sulfasalazine Anaphylaxis   Tetracyclines & Related Hives and Itching   Aspirin Other (See Comments)    Abdominal Pain    Demeclocycline    Latex Rash    Plastic bandaids/ gloves    Family History  Problem Relation Age of Onset   Breast cancer Maternal Aunt 90       great aunt   Prostate cancer Father    Hypertension Father    Heart disease Father    Alzheimer's disease Mother    Heart disease Mother    Hypertension Mother    Colon polyps Mother    Depression Brother        brother #2    Prior to Admission medications   Medication Sig Start Date End Date Taking? Authorizing Provider  acetaminophen (TYLENOL) 500 MG tablet Take 500-1,000 mg by mouth 2 (two) times daily as needed for  moderate pain (FOR HEADACHES/BACK PAIN.).     [provider]  albuterol (PROVENTIL HFA;VENTOLIN HFA) 108 (90 BASE) MCG/ACT inhaler Inhale 2 puffs into the lungs every 6 (six) hours as needed for wheezing or shortness of breath.     [provider]  ALPRAZolam Duanne Moron) 0.25 MG tablet Take 0.25 mg by mouth 3 (three) times daily as needed for anxiety.  07/24/17   [provider]  atenolol (TENORMIN) 50 MG tablet Take 50 mg by mouth daily.    [provider]  atorvastatin (LIPITOR) 10 MG tablet Take 1 tablet (10 mg total) by mouth daily. 10/21/21   Minna Merritts, MD  Calcium Carbonate-Vitamin D (CALTRATE 600+D PO) Take 1 tablet by mouth daily. Chewable    [provider]  denosumab (PROLIA) 60 MG/ML SOSY injection Inject 60 mg into the skin every  6 (six) months. Patient not taking: Reported on 10/21/2021    [provider]  eletriptan (RELPAX) 40 MG tablet Take 40 mg by mouth as needed for migraine or headache. May repeat in 2 hours if headache persists or recurs. Patient not taking: Reported on 05/17/2021    [provider]  ezetimibe (ZETIA) 10 MG tablet Take 1 tablet (10 mg total) by mouth daily. 10/21/21   Minna Merritts, MD  hydroxypropyl methylcellulose / hypromellose (ISOPTO TEARS / GONIOVISC) 2.5 % ophthalmic solution Place 1 drop into both eyes as needed for dry eyes.    [provider]  Lifitegrast 5 % SOLN Place 1 drop into both eyes 2 (two) times daily as needed (for excessively dry eyes.).    [provider]  Multiple Vitamins-Minerals (PRESERVISION AREDS 2+MULTI VIT PO) Take by mouth in the morning and at bedtime.    [provider]  pantoprazole (PROTONIX) 40 MG tablet Take 40 mg by mouth daily.    [provider]  Polyethyl Glyc-Propyl Glyc PF (SYSTANE ULTRA PF) 0.4-0.3 % SOLN Apply 1 drop to eye as needed (both eye for dry eyes).    [provider]  pregabalin (LYRICA) 25 MG capsule  Take 25 mg by mouth 2 (two) times daily. 09/07/21   [provider]  Propylene Glycol-Glycerin 0.6-0.6 % SOLN Place 1-2 drops into both eyes 5 (five) times daily as needed (for dry eyes.). SOOTHE XP    [provider]  spironolactone (ALDACTONE) 50 MG tablet Take 25 mg by mouth daily.     [provider]  traMADol (ULTRAM) 50 MG tablet Take 25-50 mg by mouth 2 (two) times daily as needed for moderate pain.    [provider]  vitamin B-12 (CYANOCOBALAMIN) 1000 MCG tablet Take 1,000 mcg by mouth daily.    [provider]  vitamin E 1000 UNIT capsule Take 1,000 Units by mouth daily.    [provider]  zolpidem (AMBIEN) 10 MG tablet Take 10 mg by mouth at bedtime as needed for sleep.    [provider]    Physical Exam: Vitals:   12/11/21 0515 12/11/21 0519 12/11/21 0922  BP:  125/63 (!) 142/90  Pulse:  63 (!) 59  Resp:  18   Temp:  98.1 F (36.7 C)   TempSrc:  Oral   SpO2:  100% 96%  Weight: 54 kg    Height: _0  (1.651 m)      Constitutional: Older female currently in no acute distress Eyes: PERRL, lids and conjunctivae normal ENMT: Mucous membranes are moist.   Neck: normal, supple Respiratory: clear to auscultation bilaterally, no wheezing, no crackles. Normal respiratory effort. No accessory muscle use.  Cardiovascular: Regular rate and rhythm, no murmurs / rubs / gallops. No extremity edema. 2+ pedal pulses. No carotid bruits.  Abdomen: no tenderness, no masses palpated. Bowel sounds positive.  Musculoskeletal: no clubbing / cyanosis. No joint deformity upper and lower extremities.  Skin: Abrasion to the right knee Neurologic: CN 2-12 grossly intact.  Strength 5/5 in all 4.  Psychiatric: Normal judgment and insight. Alert and oriented x 3. Normal mood.   Data Reviewed:  EKG revealed sinus rhythm at 62 bpm. Reviewed labs, imaging, and pertinent records as noted above in HPI.  Assessment and Plan: Nondisplaced  intertrochanteric right femur fracture secondary to fall at home Acute.  Patient had rolled bed landing onto her right side.  Found to have nondisplaced intertrochanteric right femur fracture.  Dr.  Sabra Heck of orthopedics was consulted. -Admit to a MedSurg bed -Hip fracture order set utilized -N.p.o. in case of need for procedure today -Oxycodone/fentanyl IV as needed for moderate to severe pain respectively -Transitions of care consulted -Appreciate orthopedic consultative services, we will follow-up for any further recommendations  Essential hypertension Home blood pressure regimen includes atenolol 50 mg daily and spironolactone 25 mg daily. -Continue home blood pressure regimen as tolerated  COPD On physical exam lungs sound clear without signs of wheezing.  O2 saturation maintained on room air.  Patient quit smoking. -Albuterol nebs as needed for shortness of breath  Anxiety Home medication regimen includes Xanax 0.25 mg 3 times daily as needed for anxiety. -Continue Xanax -Ativan 0.25 mg IV x1 dose prior to surgery if needed  History of breast cancer History of lung cancer  Mixed Hyperlipidemia -Continue atorvastatin and Zetia  GERD h/o gastritis -Continue Protonix   Advance Care Planning:   Code Status: Full Code    Consults: Orthopedics   Family Communication: Wife updated at bedside  Severity of Illness: The appropriate patient status for this patient is INPATIENT. Inpatient status is judged to be reasonable and necessary in order to provide the required intensity of service to ensure the patient's safety. The patient's presenting symptoms, physical exam findings, and initial radiographic and laboratory data in the context of their chronic comorbidities is felt to place them at high risk for further clinical deterioration. Furthermore, it is not anticipated that the patient will be medically stable for discharge from the hospital within 2 midnights of admission.   * I  certify that at the point of admission it is my clinical judgment that the patient will require inpatient hospital care spanning beyond 2 midnights from the point of admission due to high intensity of service, high risk for further deterioration and high frequency of surveillance required.*  Author: Norval Morton, MD 12/11/2021 9:28 AM  For on call review [email or web address].

## 2021-12-11 NOTE — ED Triage Notes (Signed)
Pt reports she took an [place] and while sleeping rolled out of bed onto the floor. Pt landed on R hip. Reports R hip and thigh pain. Pt denies loss of sensation. Pt able to move foot and toes. Pedal pulses palpable. Cap refill WNL. Pt denies hitting head or LOC. Pt alert and oriented following commands appropriately. Denies h/a, dizziness, vision change, paraesthesia. Pt breathing unlabored speaking in full sentences.

## 2021-12-11 NOTE — ED Provider Triage Note (Signed)
Emergency Medicine Provider Triage Evaluation Note  Katie Franklin , a 66 y.o. female  was evaluated in triage.  Pt complains of fall , right hip pain, hx of osteoporosis.  Review of Systems  Positive:   Negative:   Physical Exam  BP 125/63 (BP Location: Left Arm)   Pulse 63   Temp 98.1 F (36.7 C) (Oral)   Resp 18   Ht 5\' 5"  (1.651 m)   Wt 54 kg   SpO2 100%   BMI 19.80 kg/m  Gen:   Awake, no distress   Resp:  Normal effort  MSK:   Right hip tender to palpation, patient unable to bear weight Other:    Medical Decision Making  Medically screening exam initiated at 8:49 AM.  Appropriate orders placed.  Katie Franklin was informed that the remainder of the evaluation will be completed by another provider, this initial triage assessment does not replace that evaluation, and the importance of remaining in the ED until their evaluation is complete.  X-ray of the right femur shows intertrochanteric fracture, secure message sent to Dr. Sabra Heck so he is aware.  He will be coming to the ER to see the patient   Katie Franklin, Katie Franklin 12/11/21 [id number]

## 2021-12-11 NOTE — Anesthesia Postprocedure Evaluation (Signed)
Anesthesia Post Note  Patient: Katie Franklin  Procedure(s) Performed: INTRAMEDULLARY (IM) NAIL INTERTROCHANTERIC (Right)  Patient location during evaluation: PACU Anesthesia Type: Spinal Level of consciousness: awake and alert, oriented and patient cooperative Pain management: pain level controlled Vital Signs Assessment: post-procedure vital signs reviewed and stable Respiratory status: spontaneous breathing, nonlabored ventilation and respiratory function stable Cardiovascular status: blood pressure returned to baseline and stable Postop Assessment: adequate PO intake, no headache and spinal receding Anesthetic complications: no   No notable events documented.   Last Vitals:  Vitals:   12/11/21 1500 12/11/21 1530  BP: 108/84 127/83  Pulse: 64 66  Resp: 18   Temp: 36.7 C   SpO2: 100% 100%    Last Pain:  Vitals:   12/11/21 1415  TempSrc:   PainSc: 0-No pain                 Darrin Nipper

## 2021-12-11 NOTE — Op Note (Signed)
DATE OF SURGERY:  12/11/2021  TIME: 1:13 PM  PATIENT NAME:  Katie Franklin  AGE: 66 y.o.  PRE-OPERATIVE DIAGNOSIS:  Right hip fracture nondisplaced intertrochanteric  POST-OPERATIVE DIAGNOSIS:  SAME  PROCEDURE:  INTRAMEDULLARY (IM) NAIL INTERTROCHANTERIC right hip  SURGEON:  Park Breed  ASST:  EBL: 10 cc  COMPLICATIONS: None  OPERATIVE IMPLANTS: Synthes trochanteric femoral nail 10 mm / 135 degree with interlocking helical blade 90 mm and distal locking screw 34 mm.  PREOPERATIVE INDICATIONS:  Katie Franklin is a 66 y.o. year old who fell and suffered a hip fracture. She was brought into the ER and then admitted and optimized and then elected for surgical intervention.    The risks benefits and alternatives were discussed with the patient including but not limited to the risks of nonoperative treatment, versus surgical intervention including infection, bleeding, nerve injury, malunion, nonunion, hardware prominence, hardware failure, need for hardware removal, blood clots, cardiopulmonary complications, morbidity, mortality, among others, and they were willing to proceed.    OPERATIVE PROCEDURE:  The patient was brought to the operating room and placed in the supine position.  Spinal anesthesia was administered, without a foley. She was placed on the fracture table.  Closed reduction was performed under C-arm guidance. The length of the femur was also measured using fluoroscopy. Time out was then performed after sterile prep and drape. She received preoperative antibiotics.  Incision was made proximal to the greater trochanter. A guidewire was placed in the appropriate position. Confirmation was made on AP and lateral views. The above-named nail was opened. I opened the proximal femur with a reamer. I then placed the nail by hand easily down. I did not need to ream the femur.  Once the nail was completely seated, I placed a guidepin into the femoral head into the center center  position through a second incision.  I measured the length, and then reamed the lateral cortex and up into the head. I then placed the helical blade. Slight compression was applied. Anatomic fixation achieved. Bone quality was mediocre.  I then secured the proximal interlock.  The distal locking screw was then placed and after confirming the position of the fracture fragments and hardware I then removed the instruments, and took final C-arm pictures AP and lateral the entire length of the leg. Anatomic reconstruction was achieved, and the wounds were irrigated copiously and closed with Vicryl  followed by staples and dry sterile dressing. Sponge and needle count were correct.   The patient was awakened and returned to PACU in stable and satisfactory condition. There no complications and the patient tolerated the procedure well.  She will be partial weightbearing as tolerated, and will be on Lovenox  For DVT prophylaxis.     Park Breed, M.D.

## 2021-12-11 NOTE — ED Triage Notes (Signed)
FIRST NURSE NOTE:  Pt arrived via [facility] from home, pt reported taking Ambien and fell out of bed, c/o R hip pain, unable to straighten leg out.    110/55 100% RA p-67

## 2021-12-11 NOTE — Consult Note (Signed)
ORTHOPAEDIC CONSULTATION  REQUESTING PHYSICIAN: Norval Morton, MD  Chief Complaint: Right hip pain  HPI: Katie Franklin is a 66 y.o. female who complains of right hip pain after falling out of her bed around 3:00 this morning.  Patient was unable to walk and was brought to the emergency room where exam and x-rays revealed a nondisplaced intertrochanteric fracture of the right hip.  Patient has a history of multiple medical problems which are not active at this time for the most part.  The patient and her partner were advised that surgery was indicated to prevent as a fracture from displacing and to allow for appropriate healing and mobilization.  The risks and benefits and postop protocol were discussed with him.  At the request of proceed with surgery later this morning.  Past Medical History:  Diagnosis Date   Breast cancer ([place]) 2012   left breast   COPD (chronic obstructive pulmonary disease) ([place])    Deafness in left ear    Depression    Diverticulosis    Ductal carcinoma of left breast ([place]) 2012   Family history of adverse reaction to anesthesia    mother got sick   Gastritis    GERD (gastroesophageal reflux disease)    H. pylori infection    H/O herpes labialis    Hemorrhoids    History of chicken pox    Hyperplastic colon polyp    Hypertension    Lung cancer ([place]) 08/2017   surgery only, no rad or chemo tx   Multinodular goiter (nontoxic)    Osteoporosis    Panic disorder    Personal history of radiation therapy    Recurrent sinusitis    Recurrent sinusitis    Tobacco use    Tubular adenoma of colon    Past Surgical History:  Procedure Laterality Date   BACK SURGERY  2018   lumbar   BREAST EXCISIONAL BIOPSY Left    negative 1980's twice   BREAST EXCISIONAL BIOPSY Left 2012   [facility]   BREAST LUMPECTOMY Left 2012   [facility], clear margins, LN neg   BUNIONECTOMY Right    CHOLECYSTECTOMY N/A 06/11/2017   Procedure: LAPAROSCOPIC CHOLECYSTECTOMY;  Surgeon:  Herbert Pun, MD;  Location: [facility] ORS;  Service: General;  Laterality: N/A;   COLONOSCOPY N/A 05/17/2021   Procedure: COLONOSCOPY;  Surgeon: Lesly Rubenstein, MD;  Location: [facility] ENDOSCOPY;  Service: Endoscopy;  Laterality: N/A;   COLONOSCOPY WITH PROPOFOL N/A 12/28/2014   Procedure: COLONOSCOPY WITH PROPOFOL;  Surgeon: Lollie Sails, MD;  Location: [facility] ENDOSCOPY;  Service: Endoscopy;  Laterality: N/A;   COLONOSCOPY WITH PROPOFOL N/A 08/17/2017   Procedure: COLONOSCOPY WITH PROPOFOL;  Surgeon: Lollie Sails, MD;  Location: [facility] ENDOSCOPY;  Service: Endoscopy;  Laterality: N/A;   cyst throat     base of tongue   ESOPHAGOGASTRODUODENOSCOPY     ESOPHAGOGASTRODUODENOSCOPY N/A 04/13/2020   Procedure: ESOPHAGOGASTRODUODENOSCOPY (EGD);  Surgeon: Lesly Rubenstein, MD;  Location[facility] END[facility]COPY;  Service: Endoscopy;  Laterality: N/A;   ESOPHAGOGASTRODUODENOSCOPY (EGD) WITH PROPOFOL N/A 12/02/2015   Procedure: ESOPHAGOGASTRODUODENOSCOPY (EGD) WITH PROPOFOL;  Surgeon: Lollie Sails, MD;  Location: [facility] ENDOSCOPY;  Service: Endoscopy;  Laterality: N/A;   ESOPHAGOGASTRODUODENOSCOPY (EGD) WITH PROPOFOL N/A 04/13/2017   Procedure: ESOPHAGOGASTRODUODENOSCOPY (EGD) WITH PROPOFOL;  Surgeon: Lollie Sails, MD;  Location: [facility] ENDOSCOPY;  Service: Endoscopy;  Laterality: N/A;   EYE SURGERY  08/27/2019   cataract extraction right   EYE SURGERY  09/10/2019   cataract extraction left  FLEXIBLE BRONCHOSCOPY N/A 08/27/2017   Procedure: PREOP BRONCHOSCOPY;  Surgeon: Nestor Lewandowsky, MD;  Location: [facility] ORS;  Service: Thoracic;  Laterality: N/A;   GANGLION CYST EXCISION Right    wrist   Incision tendon sheath for trigger finger Left    LUMBAR LAMINECTOMY/DECOMPRESSION MICRODISCECTOMY Right 11/06/2016   Procedure: RIGHT L5/S1 HEMILAMINECTOMY AND CYST RESECTION L5/S1;  Surgeon: Deetta Perla, MD;  Location: [facility] ORS;  Service: Neurosurgery;  Laterality: Right;   MASTECTOMY  07/2010    partial mastectomy left   MULTIPLE TOOTH EXTRACTIONS N/A    9 teeth   NASAL SEPTUM SURGERY     THORACOTOMY Left 08/27/2017   Procedure: THORACOTOMY MAJOR;  Surgeon: Nestor Lewandowsky, MD;  Location: [facility] ORS;  Service: Thoracic;  Laterality: Left;   TONSILLECTOMY     TRIGGER FINGER RELEASE Left    Social History   Socioeconomic History   Marital status: Married    Spouse name: Not on file   Number of children: Not on file   Years of education: Not on file   Highest education level: Not on file  Occupational History   Not on file  Tobacco Use   Smoking status: Former    Packs/day: 1.00    Years: 45.00    Total pack years: 45.00    Types: Cigarettes    Quit date: 07/26/2017    Years since quitting: 4.3   Smokeless tobacco: Never  Vaping Use   Vaping Use: Never used  Substance and Sexual Activity   Alcohol use: No    Alcohol/week: 0.0 standard drinks of alcohol   Drug use: No   Sexual activity: Not on file  Other Topics Concern   Not on file  Social History Narrative   Not on file   Social Determinants of Health   Financial Resource Strain: Not on file  Food Insecurity: Not on file  Transportation Needs: Not on file  Physical Activity: Not on file  Stress: Not on file  Social Connections: Not on file   Family History  Problem Relation Age of Onset   Breast cancer Maternal Aunt 90       great aunt   Prostate cancer Father    Hypertension Father    Heart disease Father    Alzheimer's disease Mother    Heart disease Mother    Hypertension Mother    Colon polyps Mother    Depression Brother        brother #2   Allergies  Allergen Reactions   Erythromycin Anaphylaxis and Rash   Septra Ds [Sulfamethoxazole-Trimethoprim] Anaphylaxis    Neck swelling as well    Sulfa Antibiotics Anaphylaxis   Sulfasalazine Anaphylaxis   Tetracyclines & Related Hives and Itching   Aspirin Other (See Comments)    Abdominal Pain    Demeclocycline    Amoxicillin Rash   Latex  Rash    Plastic bandaids/ gloves   Prior to Admission medications   Medication Sig Start Date End Date Taking? Authorizing Provider  ALPRAZolam (XANAX) 0.25 MG tablet Take 0.25 mg by mouth 3 (three) times daily as needed for anxiety.  07/24/17  Yes [provider]  atenolol (TENORMIN) 50 MG tablet Take 50 mg by mouth daily.   Yes [provider]  atorvastatin (LIPITOR) 10 MG tablet Take 1 tablet (10 mg total) by mouth daily. 10/21/21  Yes Gollan, Kathlene November, MD  Calcium Carbonate-Vitamin D (CALTRATE 600+D PO) Take 1 tablet by mouth daily. Chewable   Yes [provider]  ezetimibe (ZETIA) 10 MG tablet Take 1 tablet (10 mg total) by mouth daily. 10/21/21  Yes Gollan, Kathlene November, MD  Lifitegrast 5 % SOLN Place 1 drop into both eyes 2 (two) times daily as needed (for excessively dry eyes.).   Yes [provider]  Multiple Vitamins-Minerals (PRESERVISION AREDS 2+MULTI VIT PO) Take by mouth in the morning and at bedtime.   Yes [provider]  pantoprazole (PROTONIX) 40 MG tablet Take 40 mg by mouth daily.   Yes [provider]  pregabalin (LYRICA) 25 MG capsule Take 25 mg by mouth 2 (two) times daily. 09/07/21  Yes [provider]  spironolactone (ALDACTONE) 50 MG tablet Take 25 mg by mouth daily.    Yes [provider]  vitamin B-12 (CYANOCOBALAMIN) 1000 MCG tablet Take 1,000 mcg by mouth daily.   Yes [provider]  vitamin E 1000 UNIT capsule Take 1,000 Units by mouth daily.   Yes [provider]  zolpidem (AMBIEN) 10 MG tablet Take 10 mg by mouth at bedtime as needed for sleep.   Yes [provider]  acetaminophen (TYLENOL) 500 MG tablet Take 500-1,000 mg by mouth 2 (two) times daily as needed for moderate pain (FOR HEADACHES/BACK PAIN.).     [provider]  albuterol (PROVENTIL HFA;VENTOLIN HFA) 108 (90 BASE) MCG/ACT inhaler Inhale 2 puffs into the lungs every 6 (six) hours as needed for wheezing  or shortness of breath.     [provider]  denosumab (PROLIA) 60 MG/ML SOSY injection Inject 60 mg into the skin every 6 (six) months. Patient not taking: Reported on 10/21/2021    [provider]  hydroxypropyl methylcellulose / hypromellose (ISOPTO TEARS / GONIOVISC) 2.5 % ophthalmic solution Place 1 drop into both eyes as needed for dry eyes.    [provider]  Polyethyl Glyc-Propyl Glyc PF (SYSTANE ULTRA PF) 0.4-0.3 % SOLN Apply 1 drop to eye as needed (both eye for dry eyes).    [provider]  Propylene Glycol-Glycerin 0.6-0.6 % SOLN Place 1-2 drops into both eyes 5 (five) times daily as needed (for dry eyes.). SOOTHE XP    [provider]  traMADol (ULTRAM) 50 MG tablet Take 25-50 mg by mouth 2 (two) times daily as needed for moderate pain.    [provider]   DG Chest Portable 1 View  Result Date: 12/11/2021 CLINICAL DATA:  Right hip pain. EXAM: PORTABLE CHEST 1 VIEW COMPARISON:  November 27, 2017. FINDINGS: The heart size and mediastinal contours are within normal limits. Postsurgical changes are noted in the left upper lobe. No acute pulmonary disease is noted. The visualized skeletal structures are unremarkable. IMPRESSION: No active disease. Electronically Signed   By: Marijo Conception M.D.   On: 12/11/2021 09:16   DG Femur Min 2 Views Right  Result Date: 12/11/2021 CLINICAL DATA:  66 year old female status post fall from bed. Right hip pain. EXAM: RIGHT FEMUR 2 VIEWS COMPARISON:  CT Abdomen and Pelvis 02/07/2021. FINDINGS: Bone mineralization is within normal limits for age. Right femoral head normally located. Visible right hemipelvis appears intact. Subtle lucency in the intertrochanteric segment of the right femur, with evidence of cortical irregularity at the greater trochanter. This is nondisplaced. Elsewhere the right femur appears intact. Right knee alignment appears maintained. IMPRESSION: Positive for evidence of subtle,  nondisplaced intertrochanteric right femur fracture. Electronically Signed   By: Genevie Ann M.D.   On: 12/11/2021 06:52   DG Pelvis 1-2 Views  Result Date:  12/11/2021 CLINICAL DATA:  Fall onto right hip EXAM: PELVIS - 1-2 VIEW COMPARISON:  Concurrently obtained radiographs of the right femur FINDINGS: Subtle lucency in the intertrochanteric region of the right femur concerning for nondisplaced intertrochanteric fracture. The bony pelvis appears to be intact. Bowel gas pattern is unremarkable. IMPRESSION: Suspect nondisplaced right intertrochanteric femoral neck fracture. Electronically Signed   By: Jacqulynn Cadet M.D.   On: 12/11/2021 06:52    Positive ROS: All other systems have been reviewed and were otherwise negative with the exception of those mentioned in the HPI and as above.  Physical Exam: General: Alert, no acute distress Cardiovascular: No pedal edema Respiratory: No cyanosis, no use of accessory musculature GI: No organomegaly, abdomen is soft and non-tender Skin: No lesions in the area of chief complaint Neurologic: Sensation intact distally Psychiatric: Patient is competent for consent with normal mood and affect Lymphatic: No axillary or cervical lymphadenopathy  MUSCULOSKELETAL: Right leg is painful on motion of the hip.  Neurovascular status is good.  No shortening of the leg.  No other orthopedic injuries are noted.  Assessment: Nondisplaced intertrochanteric fracture right hip  Plan: Open reduction internal fixation right hip fracture with trochanteric fixation nail    Park Breed, MD [phone]   12/11/2021 11:05 AM

## 2021-12-11 NOTE — Transfer of Care (Signed)
Immediate Anesthesia Transfer of Care Note  Patient: Katie Franklin  Procedure(s) Performed: INTRAMEDULLARY (IM) NAIL INTERTROCHANTERIC (Right)  Patient Location: PACU  Anesthesia Type:Spinal  Level of Consciousness: drowsy  Airway & Oxygen Therapy: Patient Spontanous Breathing  Post-op Assessment: Report given to RN and Post -op Vital signs reviewed and stable  Post vital signs: Reviewed and stable  Last Vitals:  Vitals Value Taken Time  BP 104/66 12/11/21 1310  Temp 36.2 C 12/11/21 1310  Pulse 62 12/11/21 1310  Resp 19 12/11/21 1310  SpO2 100 % 12/11/21 1310    Last Pain:  Vitals:   12/11/21 1310  TempSrc:   PainSc: Asleep         Complications: No notable events documented.

## 2021-12-11 NOTE — Anesthesia Preprocedure Evaluation (Addendum)
Anesthesia Evaluation  Patient identified by MRN, date of birth, ID band Patient awake    Reviewed: Allergy & Precautions, NPO status , Patient's Chart, lab work & pertinent test results  History of Anesthesia Complications Negative for: history of anesthetic complications  Airway Mallampati: I   Neck ROM: Full    Dental  (+) Edentulous Upper, Missing, Chipped   Pulmonary COPD, former smoker (quit 2019),  Lung CA s/p surgery   Pulmonary exam normal breath sounds clear to auscultation       Cardiovascular hypertension, + CAD  Normal cardiovascular exam Rhythm:Regular Rate:Normal  ECG 12/11/21:  Normal sinus rhythm Nonspecific ST abnormality   Neuro/Psych PSYCHIATRIC DISORDERS Anxiety Depression HOH; cervical stenosis    GI/Hepatic GERD  ,  Endo/Other  Hyperthyroidism   Renal/GU negative Renal ROS     Musculoskeletal   Abdominal   Peds  Hematology Breast CA   Anesthesia Other Findings Cardiology note 10/21/21:  Aortic atherosclerosis ([place]) Recommend she consider starting a atorvastatin 10 mg daily Continue Zetia Goal LDL less than 70 Continued smoking cessation  Centrilobular emphysema (HCC) Reports that she quit smoking Previously reported chronic mild shortness of breath on exertion History of lobectomy  Coronary artery disease involving native coronary artery of native heart without angina pectoris -  Currently with no symptoms of angina. No further workup at this time. Continue current medication regimen.  Smoker Reports that she quit smoking over a year ago  Essential hypertension - Plan: EKG 12-Lead Blood pressure is well controlled on today's visit. No changes made to the medications.  History of breast cancer Prior radiation on the left  Stressed importance of aggressive lipid control,  She has quit smoking  Leg pain Lower extremity arterial Dopplers were normal June 2022   Reproductive/Obstetrics                            Anesthesia Physical Anesthesia Plan  ASA: 2  Anesthesia Plan: Spinal and General   Post-op Pain Management:    Induction: Intravenous  PONV Risk Score and Plan: 3 and Propofol infusion, TIVA, Treatment may vary due to age or medical condition and Ondansetron  Airway Management Planned: Natural Airway and Nasal Cannula  Additional Equipment:   Intra-op Plan:   Post-operative Plan:   Informed Consent: I have reviewed the patients History and Physical, chart, labs and discussed the procedure including the risks, benefits and alternatives for the proposed anesthesia with the patient or authorized representative who has indicated his/her understanding and acceptance.       Plan Discussed with: CRNA  Anesthesia Plan Comments: (Plan for spinal and GA with natural airway, LMA/GETA backup.  Patient consented for risks of anesthesia including but not limited to:  - adverse reactions to medications - damage to eyes, teeth, lips or other oral mucosa - nerve damage due to positioning  - sore throat or hoarseness - headache, bleeding, infection, nerve damage 2/2 spinal - damage to heart, brain, nerves, lungs, other parts of body or loss of life  Informed patient about role of CRNA in peri- and intra-operative care.  Patient voiced understanding.)       Anesthesia Quick Evaluation

## 2021-12-11 NOTE — ED Notes (Signed)
Pt undressed and placed in gown. Foley bag emptied. Pt to OR with Tech.

## 2021-12-11 NOTE — H&P (Signed)
THE PATIENT WAS SEEN PRIOR TO SURGERY TODAY.  HISTORY, ALLERGIES, HOME MEDICATIONS AND OPERATIVE PROCEDURE WERE REVIEWED. RISKS AND BENEFITS OF SURGERY DISCUSSED WITH PATIENT AGAIN.  NO CHANGES FROM INITIAL HISTORY AND PHYSICAL NOTED.    

## 2021-12-11 NOTE — ED Provider Notes (Signed)
[facility] Provider Note    Event Date/Time   First MD Initiated Contact with Patient 12/11/21 [phone]     (approximate)   History   Fall and Hip Pain   HPI  Katie Franklin is a 66 y.o. female with cervical spine stenosis who comes in for a fall.  Patient reported taking Ambien last night before going to bed.  She reports rolling out of bed and hitting her right hip.  Patient is completely alert and oriented denies having her head denies any headaches or new neck pain.  She reports only having pain in her right hip and thigh.  She reports sensation intact.      Physical Exam   Triage Vital Signs: ED Triage Vitals  Enc Vitals Group     BP 12/11/21 0519 125/63     Pulse Rate 12/11/21 0519 63     Resp 12/11/21 0519 18     Temp 12/11/21 0519 98.1 F (36.7 C)     Temp Source 12/11/21 0519 Oral     SpO2 12/11/21 0519 100 %     Weight 12/11/21 0515 119 lb (54 kg)     Height 12/11/21 0515 5\' 5"  (1.651 m)     Head Circumference --      Peak Flow --      Pain Score 12/11/21 0515 10     Pain Loc --      Pain Edu? --      Excl. in [place]? --     Most recent vital signs: Vitals:   12/11/21 0519  BP: 125/63  Pulse: 63  Resp: 18  Temp: 98.1 F (36.7 C)  SpO2: 100%     General: Awake, no distress.  CV:  Good peripheral perfusion.  Resp:  Normal effort.  No chest pain Abd:  No distention.  Nontender Other:  Positive right hip pain.  Good distal pulse.  Sensation intact.  Able to flex and dorsiflex the ankle. No new cervical pain.  No numbness or tingling.  Full range of motion of neck.  No hematoma noted to the head  ED Results / Procedures / Treatments   Labs (all labs ordered are listed, but only abnormal results are displayed) Labs Reviewed  COMPREHENSIVE METABOLIC PANEL - Abnormal; Notable for the following components:      Result Value   Glucose, Bld 112 (*)    All other components within normal limits  CBC WITH DIFFERENTIAL/PLATELET      EKG  My interpretation of EKG:  Normal sinus rate of 62 without any ST elevation or T wave inversions, normal intervals  RADIOLOGY I have reviewed the xray personally and and interpreted and patient has right intertrochanter fracture of the femur   PROCEDURES:  Critical Care performed: No  Procedures   MEDICATIONS ORDERED IN ED: Medications  Tdap (BOOSTRIX) injection 0.5 mL (has no administration in time range)  oxyCODONE-acetaminophen (PERCOCET/ROXICET) 5-325 MG per tablet 1 tablet (1 tablet Oral Given 12/11/21 2409)     IMPRESSION / MDM / ASSESSMENT AND PLAN / ED COURSE  I reviewed the triage vital signs and the nursing notes.   Patient's presentation is most consistent with acute presentation with potential threat to life or bodily function.   Differential includes hip fracture, dislocation.  Good distal pulse.  We discussed CT imaging to rule out issues of the brain or the spine but patient is adamant that she did not hit her head or her neck and declines  imaging due to very low suspicion.  Preop labs and x-ray were ordered  CMP reassuring CBC normal.  Patient was in the triage area for over 4 hours due to no beds being available therefore due to scarcity of nursing staff Foley was ordered due to patient being unable to ambulate due to fracture and due to know where to put a pure wick.  I reviewed patient's note from 11/17/2021 where she is seen by phys med  Dr. Sabra Heck from orthopedics has been contacted. Will admit to hospital    FINAL CLINICAL IMPRESSION(S) / ED DIAGNOSES   Final diagnoses:  Fall, initial encounter  Closed fracture of right femur, unspecified fracture morphology, initial encounter ([place])     Rx / DC Orders   ED Discharge Orders     None        Note:  This document was prepared using Dragon voice recognition software and may include unintentional dictation errors.   Vanessa [place], MD 12/11/21 [phone]

## 2021-12-12 ENCOUNTER — Encounter: Payer: Self-pay | Admitting: Specialist

## 2021-12-12 DIAGNOSIS — S72144A Nondisplaced intertrochanteric fracture of right femur, initial encounter for closed fracture: Secondary | ICD-10-CM | POA: Diagnosis not present

## 2021-12-12 LAB — BASIC METABOLIC PANEL
Anion gap: 11 (ref 5–15)
BUN: 7 mg/dL — ABNORMAL LOW (ref 8–23)
CO2: 24 mmol/L (ref 22–32)
Calcium: 8.7 mg/dL — ABNORMAL LOW (ref 8.9–10.3)
Chloride: 107 mmol/L (ref 98–111)
Creatinine, Ser: 0.64 mg/dL (ref 0.44–1.00)
GFR, Estimated: >60 mL/min (ref >60)
Glucose, Bld: 104 mg/dL — ABNORMAL HIGH (ref 70–99)
Potassium: 3.3 mmol/L — ABNORMAL LOW (ref 3.5–5.1)
Sodium: 142 mmol/L (ref 135–145)

## 2021-12-12 LAB — CBC
HCT: 34.8 % — ABNORMAL LOW (ref 36.0–46.0)
Hemoglobin: 11.9 g/dL — ABNORMAL LOW (ref 12.0–15.0)
MCH: 31.2 pg (ref 26.0–34.0)
MCHC: 34.2 g/dL (ref 30.0–36.0)
MCV: 91.3 fL (ref 80.0–100.0)
Platelets: 174 10*3/uL (ref 150–400)
RBC: 3.81 MIL/uL — ABNORMAL LOW (ref 3.87–5.11)
RDW: 13.2 % (ref 11.5–15.5)
WBC: 6.6 10*3/uL (ref 4.0–10.5)
nRBC: 0 % (ref 0.0–0.2)

## 2021-12-12 LAB — HIV ANTIBODY (ROUTINE TESTING W REFLEX): HIV Screen 4th Generation wRfx: NONREACTIVE

## 2021-12-12 MED ORDER — POTASSIUM CHLORIDE CRYS ER 20 MEQ PO TBCR
40.0000 meq | EXTENDED_RELEASE_TABLET | Freq: Once | ORAL | Status: AC
  Administered 2021-12-12: 40 meq via ORAL
  Filled 2021-12-12: qty 2

## 2021-12-12 MED ORDER — OXYCODONE HCL 5 MG PO TABS
5.0000 mg | ORAL_TABLET | ORAL | Status: DC | PRN
  Administered 2021-12-12: 10 mg via ORAL
  Administered 2021-12-12: 5 mg via ORAL
  Administered 2021-12-12: 10 mg via ORAL
  Administered 2021-12-14: 5 mg via ORAL
  Filled 2021-12-12: qty 2
  Filled 2021-12-12: qty 1
  Filled 2021-12-12: qty 2
  Filled 2021-12-12: qty 1

## 2021-12-12 MED ORDER — ACETAMINOPHEN 325 MG PO TABS
650.0000 mg | ORAL_TABLET | Freq: Four times a day (QID) | ORAL | Status: DC | PRN
  Administered 2021-12-13 – 2021-12-14 (×3): 650 mg via ORAL
  Filled 2021-12-12 (×3): qty 2

## 2021-12-12 MED ORDER — EZETIMIBE 10 MG PO TABS
10.0000 mg | ORAL_TABLET | Freq: Every day | ORAL | Status: DC
  Administered 2021-12-13: 10 mg via ORAL
  Filled 2021-12-12: qty 1

## 2021-12-12 MED ORDER — ATORVASTATIN CALCIUM 10 MG PO TABS
10.0000 mg | ORAL_TABLET | Freq: Every day | ORAL | Status: DC
  Administered 2021-12-13: 10 mg via ORAL
  Filled 2021-12-12: qty 1

## 2021-12-12 MED ORDER — MORPHINE SULFATE (PF) 2 MG/ML IV SOLN: 1.0000 mg | INTRAVENOUS | Status: DC | PRN

## 2021-12-12 NOTE — TOC Progression Note (Signed)
Transition of Care [facility]) - Progression Note    Patient Details  Name: Katie Franklin MRN: [id number] Date of Birth: 1955-08-27  Transition of [facility] CM/SW [facility], RN Phone Number: 12/12/2021, 4:12 PM  Clinical Narrative:    [facility] will accept the patient for [facility] PT, Per Corene Cornea   Expected Discharge Plan: [place] Barriers to Discharge: Continued Medical Work up  Expected Discharge Plan and Services Expected Discharge Plan: [place]   Discharge Planning Services: CM Consult   Living arrangements for the past 2 months: Single Family Home                 DME Arranged: Gilford Rile rolling, 3-N-1 DME Agency: [employer] Date DME Agency Contacted: 12/12/21 Time DME Agency Contacted: [phone]   HH Arranged: PT           Social Determinants of Health (SDOH) Interventions    Readmission Risk Interventions     No data to display

## 2021-12-12 NOTE — Progress Notes (Signed)
Subjective: 1 Day Post-Op Procedure(s) (LRB): INTRAMEDULLARY (IM) NAIL INTERTROCHANTERIC (Right) Patient is doing well today.  She is out of bed in a chair and comfortable.  She started PT.  Dressing is dry.  Hemoglobin is stable.  Patient reports pain as mild.  Objective:   VITALS:   Vitals:   12/12/21 0320 12/12/21 0734  BP: 99/70 105/73  Pulse: 68 74  Resp: 17 15  Temp: 98.2 F (36.8 C) 97.9 F (36.6 C)  SpO2: 97% 97%    Neurologically intact Incision: dressing C/D/I  LABS Recent Labs    12/11/21 0527 12/11/21 1633 12/12/21 0348  HGB 12.6 13.0 11.9*  HCT 38.1 38.6 34.8*  WBC 7.5 9.1 6.6  PLT 202 188 174    Recent Labs    12/11/21 0527 12/11/21 1633 12/12/21 0348  NA 140  --  142  K 3.5  --  3.3*  BUN 10  --  7*  CREATININE 0.80 0.72 0.64  GLUCOSE 112*  --  104*    Recent Labs    12/11/21 1633  INR 1.1     Assessment/Plan: 1 Day Post-Op Procedure(s) (LRB): INTRAMEDULLARY (IM) NAIL INTERTROCHANTERIC (Right)   Advance diet Up with therapy Discharge home with home health tomorrow.

## 2021-12-12 NOTE — Progress Notes (Signed)
PROGRESS NOTE    Katie Franklin  JSH:[id number] DOB: 02/11/1956 DOA: 12/11/2021 PCP: [name], MD    Brief Narrative:  66 y.o. female with medical history significant of hypertension, COPD, anxiety, prior tobacco abuse, breast cancer s/p lumpectomy with radiation, lung cancer s/p surgery, and GERD who presents after falling out of bed.  She had taken Ambien last night for sleep but does not take this medication regularly.  Overnight patient rolled over and fell out of bed laying onto her right hip.  She denied any trauma to her head or loss of consciousness, but did hit possibly hit her right knee sustaining an abrasion.   On admission to the emergency department patient was noted to be tachycardic with all other vital signs maintained.  Labs relatively unremarkable.  X-rays revealed nondisplaced intertrochanteric right femur fracture.  Dr. Sabra Heck of orthopedics was consulted.  Patient has been given Tdap booster, oxycodone 5 mg p.o., and fentanyl 25 mcg IV.  Orthopedics consulted.  Patient taken to the OR on 8/27.  Status post right hip intramedullary nail for enteric trochanteric right hip fracture.  Tolerated procedure well.  No immediate postoperative complications.   Assessment & Plan:   Principal Problem:   Nondisplaced intertrochanteric fracture of right femur, initial encounter for closed fracture [facility]) Active Problems:   Fall at home, initial encounter   HTN (hypertension)   COPD (chronic obstructive pulmonary disease) (HCC)   Anxiety   History of breast cancer   History of lung cancer   Mixed hyperlipidemia   Gastritis  Nondisplaced intertrochanteric right femur fracture secondary to fall at home Acute.  Patient had rolled bed landing onto her right side.  Found to have nondisplaced intertrochanteric right femur fracture.  Dr. Sabra Heck of orthopedics was consulted. Status post IM nail 8/27 Tolerated procedure well.  No immediate complications Plan: Okay for  diet Multimodal pain control PT OT consults DVT prophylaxis, subcu Lovenox x14 days Orthopedic follow-up   Essential hypertension Home blood pressure regimen includes atenolol 50 mg daily and spironolactone 25 mg daily. Home regimen has been resumed Ensure pain control   COPD On physical exam lungs sound clear without signs of wheezing.   O2 saturation maintained on room air.  Patient quit smoking. -Albuterol nebs as needed for shortness of breath   Anxiety Home medication regimen includes Xanax 0.25 mg 3 times daily as needed for anxiety. -Continue Xanax   History of breast cancer History of lung cancer   Mixed Hyperlipidemia -Continue atorvastatin and Zetia   GERD h/o gastritis -Continue Protonix   DVT prophylaxis: SQ Lovenox Code Status: Full Family Communication: None today.  Offered to call but patient declined Disposition Plan: Status is: Inpatient Remains inpatient appropriate because: Hip fracture.  Postoperative day #1.  PT OT, pain control   Level of care: Med-Surg  Consultants:  Orthopedics  Procedures:  Right hip IM nail 8/27  Antimicrobials: None   Subjective: Seen and examined.  Sitting up in chair.  Postoperative pain mild to moderate.  Endorsing some nausea associated with narcotic use.  Objective: Vitals:   12/11/21 1556 12/11/21 1923 12/12/21 0320 12/12/21 0734  BP: 113/75 109/67 99/70 105/73  Pulse: 66 70 68 74  Resp: 16 18 17 15   Temp:  97.6 F (36.4 C) 98.2 F (36.8 C) 97.9 F (36.6 C)  TempSrc:    Oral  SpO2: 100% 97% 97% 97%  Weight:      Height:        Intake/Output Summary (Last  24 hours) at 12/12/2021 1029 Last data filed at 12/12/2021 0600 Gross per 24 hour  Intake 1355.45 ml  Output 3060 ml  Net -1704.55 ml   Filed Weights   12/11/21 0515  Weight: 54 kg    Examination:  General exam: No acute distress Respiratory system: Diminished breath sounds.  Normal work of breathing.  Room air Cardiovascular system:  S1-S2, RRR, no murmurs, no pedal edema Gastrointestinal system: Soft, NT/ND, normal bowel sounds Central nervous system: Alert and oriented. No focal neurological deficits. Extremities: Right hip surgical dressing CDI Skin: No rashes, lesions or ulcers Psychiatry: Judgement and insight appear normal. Mood & affect appropriate.     Data Reviewed: I have personally reviewed following labs and imaging studies  CBC: Recent Labs  Lab 12/11/21 0527 12/11/21 1633 12/12/21 0348  WBC 7.5 9.1 6.6  NEUTROABS 5.2  --   --   HGB 12.6 13.0 11.9*  HCT 38.1 38.6 34.8*  MCV 92.3 92.6 91.3  PLT 202 188 086   Basic Metabolic Panel: Recent Labs  Lab 12/11/21 0527 12/11/21 1633 12/12/21 0348  NA 140  --  142  K 3.5  --  3.3*  CL 108  --  107  CO2 26  --  24  GLUCOSE 112*  --  104*  BUN 10  --  7*  CREATININE 0.80 0.72 0.64  CALCIUM 9.0  --  8.7*   GFR: Estimated Creatinine Clearance: 57.4 mL/min (by C-G formula based on SCr of 0.64 mg/dL). Liver Function Tests: Recent Labs  Lab 12/11/21 0527  AST 15  ALT 13  ALKPHOS 39  BILITOT 1.1  PROT 6.8  ALBUMIN 3.9   No results for input(s): "LIPASE", "AMYLASE" in the last 168 hours. No results for input(s): "AMMONIA" in the last 168 hours. Coagulation Profile: Recent Labs  Lab 12/11/21 1633  INR 1.1   Cardiac Enzymes: No results for input(s): "CKTOTAL", "CKMB", "CKMBINDEX", "TROPONINI" in the last 168 hours. BNP (last 3 results) No results for input(s): "PROBNP" in the last 8760 hours. HbA1C: No results for input(s): "HGBA1C" in the last 72 hours. CBG: No results for input(s): "GLUCAP" in the last 168 hours. Lipid Profile: No results for input(s): "CHOL", "HDL", "LDLCALC", "TRIG", "CHOLHDL", "LDLDIRECT" in the last 72 hours. Thyroid Function Tests: No results for input(s): "TSH", "T4TOTAL", "FREET4", "T3FREE", "THYROIDAB" in the last 72 hours. Anemia Panel: No results for input(s): "VITAMINB12", "FOLATE", "FERRITIN", "TIBC",  "IRON", "RETICCTPCT" in the last 72 hours. Sepsis Labs: No results for input(s): "PROCALCITON", "LATICACIDVEN" in the last 168 hours.  No results found for this or any previous visit (from the past 240 hour(s)).       Radiology Studies: DG HIP UNILAT WITH PELVIS 2-3 VIEWS RIGHT  Addendum Date: 12/11/2021   ADDENDUM REPORT: 12/11/2021 16:04 ADDENDUM: Imaging of the entire femur was performed. No other fracture, subluxation or dislocation identified. No knee effusion is identified. No focal bony lesions are noted. Electronically Signed   By: Margarette Canada M.D.   On: 12/11/2021 16:04   Result Date: 12/11/2021 CLINICAL DATA:  ORIF RIGHT femur fracture. EXAM: DG HIP (WITH OR WITHOUT PELVIS) 2V RIGHT COMPARISON:  Prior studies FINDINGS: Nail/screw fixation of the proximal RIGHT femur noted, traversing a intertrochanteric fracture in near-anatomic alignment and position. No complicating features are identified. IMPRESSION: ORIF of intertrochanteric RIGHT femur fracture. Electronically Signed: By: Margarette Canada M.D. On: 12/11/2021 15:01   DG HIP UNILAT WITH PELVIS 2-3 VIEWS RIGHT  Addendum Date: 12/11/2021   ADDENDUM  REPORT: 12/11/2021 13:14 ADDENDUM: In 1 of the images, there is linear density seen in the shaft of femur below the level of the intramedullary rod. This may be an artifact or suggest undisplaced fracture. Please consider radiographs of right femur. These results will be called to the ordering clinician or representative by the Radiologist Assistant, and communication documented in the PACS or [employer]. Electronically Signed   By: Elmer Picker M.D.   On: 12/11/2021 13:14   Result Date: 12/11/2021 CLINICAL DATA:  Fluoroscopic assistance for internal fixation EXAM: DG HIP (WITH OR WITHOUT PELVIS) 2-3V RIGHT COMPARISON:  Study done earlier today FINDINGS: There is interval internal fixation of intertrochanteric fracture of proximal right femur with intramedullary rod. Fluoroscopic  time 56 seconds. Fluoroscopic dose is not provided. IMPRESSION: Fluoroscopic assistance was provided for internal fixation of intertrochanteric fracture of right femur. Electronically Signed: By: Elmer Picker M.D. On: 12/11/2021 13:09   DG C-Arm 1-60 Min-No Report  Result Date: 12/11/2021 Fluoroscopy was utilized by the requesting physician.  No radiographic interpretation.   DG Chest Portable 1 View  Result Date: 12/11/2021 CLINICAL DATA:  Right hip pain. EXAM: PORTABLE CHEST 1 VIEW COMPARISON:  November 27, 2017. FINDINGS: The heart size and mediastinal contours are within normal limits. Postsurgical changes are noted in the left upper lobe. No acute pulmonary disease is noted. The visualized skeletal structures are unremarkable. IMPRESSION: No active disease. Electronically Signed   By: Marijo Conception M.D.   On: 12/11/2021 09:16   DG Femur Min 2 Views Right  Result Date: 12/11/2021 CLINICAL DATA:  66 year old female status post fall from bed. Right hip pain. EXAM: RIGHT FEMUR 2 VIEWS COMPARISON:  CT Abdomen and Pelvis 02/07/2021. FINDINGS: Bone mineralization is within normal limits for age. Right femoral head normally located. Visible right hemipelvis appears intact. Subtle lucency in the intertrochanteric segment of the right femur, with evidence of cortical irregularity at the greater trochanter. This is nondisplaced. Elsewhere the right femur appears intact. Right knee alignment appears maintained. IMPRESSION: Positive for evidence of subtle, nondisplaced intertrochanteric right femur fracture. Electronically Signed   By: Genevie Ann M.D.   On: 12/11/2021 06:52   DG Pelvis 1-2 Views  Result Date: 12/11/2021 CLINICAL DATA:  Fall onto right hip EXAM: PELVIS - 1-2 VIEW COMPARISON:  Concurrently obtained radiographs of the right femur FINDINGS: Subtle lucency in the intertrochanteric region of the right femur concerning for nondisplaced intertrochanteric fracture. The bony pelvis appears to be  intact. Bowel gas pattern is unremarkable. IMPRESSION: Suspect nondisplaced right intertrochanteric femoral neck fracture. Electronically Signed   By: Jacqulynn Cadet M.D.   On: 12/11/2021 06:52        Scheduled Meds:  atenolol  50 mg Oral Daily   atorvastatin  10 mg Oral Daily   chlorhexidine   Topical Once   enoxaparin (LOVENOX) injection  30 mg Subcutaneous Q24H   ezetimibe  10 mg Oral Daily   ferrous sulfate  325 mg Oral Q breakfast   pantoprazole  40 mg Oral Daily   pregabalin  25 mg Oral BID   senna  1 tablet Oral BID   spironolactone  25 mg Oral Daily   Continuous Infusions:  sodium chloride Stopped (12/12/21 0548)    ceFAZolin (ANCEF) IV 2 g (12/12/21 0548)     LOS: 1 day     Sidney Ace, MD [facility]   If 7PM-7AM, please contact night-coverage  12/12/2021, 10:29 AM

## 2021-12-12 NOTE — Progress Notes (Signed)
Physical Therapy Treatment Patient Details Name: Katie Franklin MRN: [id number] DOB: December 08, 1955 Today's Date: 12/12/2021   History of Present Illness Pt admitted for R femur fracture with complaints of fall OOB and is s/p IM nailing on 12/11/21. History includes breast ca, COPD, and GERD.    PT Comments    Pt is making good progress towards goals with ability to ambulate further distance this session. Very motivated and less anxious regarding mobility. RW used with safe technique. Will need to perform stair training next date prior to dc home.   Recommendations for follow up therapy are one component of a multi-disciplinary discharge planning process, led by the attending physician.  Recommendations may be updated based on patient status, additional functional criteria and insurance authorization.  Follow Up Recommendations  Home health PT     Assistance Recommended at Discharge Intermittent Supervision/Assistance  Patient can return home with the following A little help with walking and/or transfers;A little help with bathing/dressing/bathroom;Help with stairs or ramp for entrance   Equipment Recommendations  Rolling walker (2 wheels);BSC/3in1    Recommendations for Other Services       Precautions / Restrictions Precautions Precautions: Fall Restrictions Weight Bearing Restrictions: Yes RLE Weight Bearing: Partial weight bearing     Mobility  Bed Mobility               General bed mobility comments: NT, received in recliner    Transfers Overall transfer level: Needs assistance Equipment used: Rolling walker (2 wheels) Transfers: Sit to/from Stand Sit to Stand: Min assist           General transfer comment: responds well to verbal cues. Safe technique    Ambulation/Gait Ambulation/Gait assistance: Min guard Gait Distance (Feet): 120 Feet Assistive device: Rolling walker (2 wheels) Gait Pattern/deviations: Step-to pattern       General Gait Details:  ambulated using RW. Step to gait pattern with safe technique. Does report fatigue with B UEs. Slight increased pain noted with exertion   Stairs             Wheelchair Mobility    Modified Rankin (Stroke Patients Only)       Balance Overall balance assessment: Needs assistance Sitting-balance support: Feet supported Sitting balance-Leahy Scale: Good     Standing balance support: Bilateral upper extremity supported Standing balance-Leahy Scale: Fair                              Cognition Arousal/Alertness: Awake/alert Behavior During Therapy: WFL for tasks assessed/performed Overall Cognitive Status: Within Functional Limits for tasks assessed                                          Exercises Other Exercises Other Exercises: ambulated to bathroom. Min assist needed for transfers using RW. Safe technique Other Exercises: perform seated ther-ex including AP, quad sets, and SLRs. 5 reps with cues for assistance    General Comments        Pertinent Vitals/Pain Pain Assessment Pain Assessment: 0-10 Pain Score: 8  Pain Location: R hip Pain Descriptors / Indicators: Operative site guarding Pain Intervention(s): Limited activity within patient's tolerance, Ice applied    Home Living Family/patient expects to be discharged to:: Private residence Living Arrangements: Spouse/significant other Available Help at Discharge: Family;Available 24 hours/day Type of Home: House Home Access: Stairs to enter  Entrance Stairs-Rails: None Entrance Stairs-Number of Steps: 1   Home Layout: Two level;Able to live on main level with bedroom/bathroom Home Equipment: None      Prior Function            PT Goals (current goals can now be found in the care plan section) Acute Rehab PT Goals Patient Stated Goal: to get out of hospital PT Goal Formulation: With patient Time For Goal Achievement: 12/26/21 Potential to Achieve Goals: Good Additional  Goals Additional Goal #1: Pt will be able to perform bed mobility/transfers with mod I and safe technique in order to improve functional independence Progress towards PT goals: Progressing toward goals    Frequency    BID      PT Plan Current plan remains appropriate    Co-evaluation              AM-PAC PT "6 Clicks" Mobility   Outcome Measure  Help needed turning from your back to your side while in a flat bed without using bedrails?: A Little Help needed moving from lying on your back to sitting on the side of a flat bed without using bedrails?: A Little Help needed moving to and from a bed to a chair (including a wheelchair)?: A Little Help needed standing up from a chair using your arms (e.g., wheelchair or bedside chair)?: A Little Help needed to walk in hospital room?: A Little Help needed climbing 3-5 steps with a railing? : A Lot 6 Click Score: 17    End of Session Equipment Utilized During Treatment: Gait belt Activity Tolerance: Patient limited by pain Patient left: in chair;with SCD's reapplied Nurse Communication: Mobility status PT Visit Diagnosis: Unsteadiness on feet (R26.81);Muscle weakness (generalized) (M62.81);Difficulty in walking, not elsewhere classified (R26.2);Pain Pain - Right/Left: Right Pain - part of body: Hip     Time: 7867-6720 PT Time Calculation (min) (ACUTE ONLY): 33 min  Charges:  $Gait Training: 8-22 mins $Therapeutic Exercise: 8-22 mins $Therapeutic Activity: 8-22 mins                     Greggory Stallion, PT, DPT, GCS [phone]    Serrena Linderman 12/12/2021, 2:47 PM

## 2021-12-12 NOTE — Plan of Care (Signed)
  Problem: Education: Goal: Knowledge of General Education information will improve Description: Including pain rating scale, medication(s)/side effects and non-pharmacologic comfort measures Outcome: Progressing   Problem: Clinical Measurements: Goal: Ability to maintain clinical measurements within normal limits will improve Outcome: Progressing   Problem: Activity: Goal: Risk for activity intolerance will decrease Outcome: Progressing   Problem: Nutrition: Goal: Adequate nutrition will be maintained Outcome: Progressing   Problem: Coping: Goal: Level of anxiety will decrease Outcome: Progressing   Problem: Coping: Goal: Level of anxiety will decrease Outcome: Progressing   Problem: Pain Managment: Goal: General experience of comfort will improve Outcome: Progressing   Problem: Safety: Goal: Ability to remain free from injury will improve Outcome: Progressing   Problem: Skin Integrity: Goal: Risk for impaired skin integrity will decrease Outcome: Progressing   Problem: Elimination: Goal: Will not experience complications related to bowel motility Outcome: Progressing

## 2021-12-12 NOTE — Evaluation (Signed)
Physical Therapy Evaluation Patient Details Name: Katie Franklin MRN: [id number] DOB: [birth year] Today's Date: 12/12/2021  History of Present Illness  Pt admitted for R femur fracture with complaints of fall OOB and is s/p IM nailing on 12/11/21. History includes breast ca, COPD, and GERD.  Clinical Impression  Pt is a pleasant 66 year old female who was admitted for R femur fracture and is s/p IM nailing, POD 1 today. Pt performs transfers with min assist and ambulation with cga and RW. Pt is very fearful of mobility, however demonstrates good attitude and willingness to participate. Pt demonstrates deficits with strength/pain/mobility. Would benefit from skilled PT to address above deficits and promote optimal return to PLOF. Recommend transition to [place] upon discharge from acute hospitalization.      Recommendations for follow up therapy are one component of a multi-disciplinary discharge planning process, led by the attending physician.  Recommendations may be updated based on patient status, additional functional criteria and insurance authorization.  Follow Up Recommendations Home health PT      Assistance Recommended at Discharge Intermittent Supervision/Assistance  Patient can return home with the following  A little help with walking and/or transfers;A little help with bathing/dressing/bathroom;Help with stairs or ramp for entrance    Equipment Recommendations Rolling walker (2 wheels);BSC/3in1  Recommendations for Other Services       Functional Status Assessment Patient has had a recent decline in their functional status and demonstrates the ability to make significant improvements in function in a reasonable and predictable amount of time.     Precautions / Restrictions Precautions Precautions: Fall Restrictions Weight Bearing Restrictions: Yes RLE Weight Bearing: Partial weight bearing      Mobility  Bed Mobility               General bed mobility comments: NT,  received in recliner    Transfers Overall transfer level: Needs assistance Equipment used: Rolling walker (2 wheels) Transfers: Sit to/from Stand Sit to Stand: Min assist           General transfer comment: cues for hand placement and educated on [place] prior to mobility. Has difficulty scooting in chair. Once standing, upright posture noted    Ambulation/Gait Ambulation/Gait assistance: Min guard Gait Distance (Feet): 40 Feet Assistive device: Rolling walker (2 wheels) Gait Pattern/deviations: Step-to pattern       General Gait Details: ambulated using slow step to gait pattern. Fatigues and antalgic gait. RW used. INtermittent cues for sequencing. Anxiety present with all mobility  Stairs            Wheelchair Mobility    Modified Rankin (Stroke Patients Only)       Balance Overall balance assessment: Needs assistance Sitting-balance support: Feet supported Sitting balance-Leahy Scale: Good     Standing balance support: Bilateral upper extremity supported Standing balance-Leahy Scale: Fair                               Pertinent Vitals/Pain Pain Assessment Pain Assessment: 0-10 Pain Score: 7  Pain Location: R hip Pain Descriptors / Indicators: Operative site guarding Pain Intervention(s): Limited activity within patient's tolerance, Ice applied, RN gave pain meds during session    [place] expects to be discharged to:: Private residence Living Arrangements: Spouse/significant other Available Help at Discharge: Family;Available 24 hours/day Type of Home: House Home Access: Stairs to enter Entrance Stairs-Rails: None Entrance Stairs-Number of Steps: 1   Home Layout: Two level;Able  to live on main level with bedroom/bathroom Home Equipment: None      Prior Function Prior Level of Function : Independent/Modified Independent;Driving             Mobility Comments: very indep and active. Mowing lawn, no history of  falls       Hand Dominance        Extremity/Trunk Assessment   Upper Extremity Assessment Upper Extremity Assessment: Overall WFL for tasks assessed    Lower Extremity Assessment Lower Extremity Assessment: Generalized weakness (R LE grossly 3/5; L LE grossly 4/5)       Communication   Communication: No difficulties  Cognition Arousal/Alertness: Awake/alert Behavior During Therapy: WFL for tasks assessed/performed Overall Cognitive Status: Within Functional Limits for tasks assessed                                          General Comments      Exercises Other Exercises Other Exercises: ambulated to bathroom. Needed BSC over toilet due to pain. Able to have BM and completed hygiene with min assist. Min assist needed for transfers   Assessment/Plan    PT Assessment Patient needs continued PT services  PT Problem List Decreased strength;Decreased balance;Decreased mobility;Pain;Decreased knowledge of use of DME       PT Treatment Interventions DME instruction;Gait training;Stair training;Therapeutic exercise;Balance training    PT Goals (Current goals can be found in the Care Plan section)  Acute Rehab PT Goals Patient Stated Goal: to get out of hospital PT Goal Formulation: With patient Time For Goal Achievement: 12/26/21 Potential to Achieve Goals: Good    Frequency BID     Co-evaluation               AM-PAC PT "6 Clicks" Mobility  Outcome Measure Help needed turning from your back to your side while in a flat bed without using bedrails?: A Little Help needed moving from lying on your back to sitting on the side of a flat bed without using bedrails?: A Little Help needed moving to and from a bed to a chair (including a wheelchair)?: A Little Help needed standing up from a chair using your arms (e.g., wheelchair or bedside chair)?: A Little Help needed to walk in hospital room?: A Little Help needed climbing 3-5 steps with a  railing? : A Lot 6 Click Score: 17    End of Session Equipment Utilized During Treatment: Gait belt Activity Tolerance: Patient limited by pain Patient left: in chair;with SCD's reapplied Nurse Communication: Mobility status PT Visit Diagnosis: Unsteadiness on feet (R26.81);Muscle weakness (generalized) (M62.81);Difficulty in walking, not elsewhere classified (R26.2);Pain Pain - Right/Left: Right Pain - part of body: Hip    Time: 8828-0034 PT Time Calculation (min) (ACUTE ONLY): 31 min   Charges:   PT Evaluation $PT Eval Low Complexity: 1 Low PT Treatments $Therapeutic Activity: 8-22 mins        Greggory Stallion, PT, DPT, GCS [phone]   Yahia Bottger 12/12/2021, 11:26 AM

## 2021-12-12 NOTE — Evaluation (Signed)
Occupational Therapy Evaluation Patient Details Name: Katie Franklin MRN: [id number] DOB: 1955-05-04 Today's Date: 12/12/2021   History of Present Illness Pt admitted for R femur fracture with complaints of fall OOB and is s/p IM nailing on 12/11/21. History includes breast ca, COPD, and GERD.   Clinical Impression   [name] was seen for OT evaluation this date. Prior to hospital admission, pt was Independent for mobility and ADLs. Pt lives with family. Pt presents to acute OT demonstrating impaired ADL performance and functional mobility 2/2 decreased activity tolerance and functional strength/ROM/balance deficits. Pt currently requires SUPERVISION don underwear, RW for standing portion, good balance inside BOS. Discussed adaptive dressing strategies, safe toileting, and leg lifter for bed mobility. Pt would benefit from skilled OT to address noted impairments and functional limitations (see below for any additional details). Upon hospital discharge, recommend HHOT to maximize pt safety and return to PLOF.   Recommendations for follow up therapy are one component of a multi-disciplinary discharge planning process, led by the attending physician.  Recommendations may be updated based on patient status, additional functional criteria and insurance authorization.   Follow Up Recommendations  Home health OT    Assistance Recommended at Discharge Intermittent Supervision/Assistance  Patient can return home with the following A little help with walking and/or transfers;A little help with bathing/dressing/bathroom;Help with stairs or ramp for entrance    Functional Status Assessment  Patient has had a recent decline in their functional status and demonstrates the ability to make significant improvements in function in a reasonable and predictable amount of time.  Equipment Recommendations  BSC/3in1    Recommendations for Other Services       Precautions / Restrictions Precautions Precautions:  Fall Restrictions Weight Bearing Restrictions: Yes RLE Weight Bearing: Partial weight bearing RLE Partial Weight Bearing Percentage or Pounds: 50      Mobility Bed Mobility               General bed mobility comments: NT, received in recliner    Transfers Overall transfer level: Needs assistance Equipment used: Rolling walker (2 wheels) Transfers: Sit to/from Stand Sit to Stand: Min guard           General transfer comment: increased time and cues      Balance Overall balance assessment: Needs assistance Sitting-balance support: Feet supported Sitting balance-Leahy Scale: Good     Standing balance support: No upper extremity supported, During functional activity Standing balance-Leahy Scale: Fair                             ADL either performed or assessed with clinical judgement   ADL Overall ADL's : Needs assistance/impaired                                       General ADL Comments: SUPERVISION don underwear, RW for standing portion, good balance inside BOS.      Pertinent Vitals/Pain Pain Assessment Pain Assessment: 0-10 Pain Score: 8  Pain Location: R hip Pain Descriptors / Indicators: Operative site guarding Pain Intervention(s): Limited activity within patient's tolerance, Repositioned     Hand Dominance     Extremity/Trunk Assessment Upper Extremity Assessment Upper Extremity Assessment: Overall WFL for tasks assessed   Lower Extremity Assessment Lower Extremity Assessment: Generalized weakness       Communication Communication Communication: No difficulties   Cognition Arousal/Alertness:  Awake/alert Behavior During Therapy: WFL for tasks assessed/performed Overall Cognitive Status: Within Functional Limits for tasks assessed                                        Home Living Family/patient expects to be discharged to:: Private residence Living Arrangements: Spouse/significant  other Available Help at Discharge: Family;Available 24 hours/day Type of Home: House Home Access: Stairs to enter [employer] of Steps: 1 Entrance Stairs-Rails: None Home Layout: Two level;Able to live on main level with bedroom/bathroom               Home Equipment: None          Prior Functioning/Environment Prior Level of Function : Independent/Modified Independent;Driving             Mobility Comments: very indep and active. Mowing lawn, no history of falls          OT Problem List: Decreased strength;Decreased range of motion;Decreased activity tolerance;Impaired balance (sitting and/or standing);Decreased safety awareness         OT Goals(Current goals can be found in the care plan section) Acute Rehab OT Goals Patient Stated Goal: to go home OT Goal Formulation: With patient Time For Goal Achievement: 12/26/21 Potential to Achieve Goals: Good   AM-PAC OT "6 Clicks" Daily Activity     Outcome Measure Help from another person eating meals?: None Help from another person taking care of personal grooming?: None Help from another person toileting, which includes using toliet, bedpan, or urinal?: A Little Help from another person bathing (including washing, rinsing, drying)?: A Little Help from another person to put on and taking off regular upper body clothing?: None Help from another person to put on and taking off regular lower body clothing?: A Little 6 Click Score: 21   End of Session Equipment Utilized During Treatment: Rolling walker (2 wheels);Gait belt  Activity Tolerance: Patient tolerated treatment well Patient left: in chair;with call bell/phone within reach;with family/visitor present  OT Visit Diagnosis: Unsteadiness on feet (R26.81);Muscle weakness (generalized) (M62.81)                Time: 7681-1572 OT Time Calculation (min): 21 min Charges:  OT General Charges $OT Visit: 1 Visit OT Evaluation $OT Eval Moderate Complexity: 1  Mod OT Treatments $Self Care/Home Management : 8-22 mins  Dessie Coma, M.S. OTR/L  12/12/21, 4:07 PM  ascom [phone]

## 2021-12-12 NOTE — TOC Progression Note (Signed)
Transition of Care [facility]) - Progression Note    Patient Details  Name: Katie Franklin MRN: [id number] Date of Birth: [birth year]  Transition of Care [facility]) CM/SW [place], RN Phone Number: 12/12/2021, 3:46 PM  Clinical Narrative:    Met with the patient to discuss DC plan She lives at home with her spouse, they provide transportation She needs a rolling walker and a 3 in1, [employer] to deliver to the room She needs HH PT, Corene Cornea with [employer] to let me know if they can assist the patient She can afford her medication    Expected Discharge Plan: [place] Barriers to Discharge: Continued Medical Work up  Expected Discharge Plan and Services Expected Discharge Plan: [place]   Discharge Planning Services: CM Consult   Living arrangements for the past 2 months: Single Family Home                 DME Arranged: Gilford Rile rolling, 3-N-1 DME Agency: [employer] Date DME Agency Contacted: 12/12/21 Time DME Agency Contacted: [phone]   [place] Arranged: PT           Social Determinants of Health (SDOH) Interventions    Readmission Risk Interventions     No data to display

## 2021-12-12 NOTE — Progress Notes (Cosign Needed)
Patient is not able to walk the distance required to go the bathroom, or he/she is unable to safely negotiate stairs required to access the bathroom.  A 3in1 BSC will alleviate this problem  

## 2021-12-13 DIAGNOSIS — S72144A Nondisplaced intertrochanteric fracture of right femur, initial encounter for closed fracture: Secondary | ICD-10-CM | POA: Diagnosis not present

## 2021-12-13 LAB — BASIC METABOLIC PANEL
Anion gap: 6 (ref 5–15)
BUN: 7 mg/dL — ABNORMAL LOW (ref 8–23)
CO2: 24 mmol/L (ref 22–32)
Calcium: 8.8 mg/dL — ABNORMAL LOW (ref 8.9–10.3)
Chloride: 109 mmol/L (ref 98–111)
Creatinine, Ser: 0.7 mg/dL (ref 0.44–1.00)
GFR, Estimated: >60 mL/min (ref >60)
Glucose, Bld: 114 mg/dL — ABNORMAL HIGH (ref 70–99)
Potassium: 3.7 mmol/L (ref 3.5–5.1)
Sodium: 139 mmol/L (ref 135–145)

## 2021-12-13 LAB — CBC
HCT: 35.6 % — ABNORMAL LOW (ref 36.0–46.0)
Hemoglobin: 12 g/dL (ref 12.0–15.0)
MCH: 31.1 pg (ref 26.0–34.0)
MCHC: 33.7 g/dL (ref 30.0–36.0)
MCV: 92.2 fL (ref 80.0–100.0)
Platelets: 178 10*3/uL (ref 150–400)
RBC: 3.86 MIL/uL — ABNORMAL LOW (ref 3.87–5.11)
RDW: 13.2 % (ref 11.5–15.5)
WBC: 7.3 10*3/uL (ref 4.0–10.5)
nRBC: 0 % (ref 0.0–0.2)

## 2021-12-13 MED ORDER — SODIUM CHLORIDE 0.9 % IV BOLUS
1000.0000 mL | Freq: Once | INTRAVENOUS | Status: AC
Start: 2021-12-13 — End: 2021-12-13
  Administered 2021-12-13: 1000 mL via INTRAVENOUS

## 2021-12-13 NOTE — Progress Notes (Signed)
Physical Therapy Treatment Patient Details Name: Katie Franklin MRN: [id number] DOB: [birth year] Today's Date: 12/13/2021   History of Present Illness Pt admitted for R femur fracture with complaints of fall OOB and is s/p IM nailing on 12/11/21. History includes breast ca, COPD, and GERD.    PT Comments    Pt is making good progress towards goals, however feel weak/nauseated today. BP low (89/66) with standing. Doesn't feel up for stair training this AM. Able to ambulate to bathroom and perform seated there-ex. Will continue to progress. Concerns relayed to RN and MD.    Recommendations for follow up therapy are one component of a multi-disciplinary discharge planning process, led by the attending physician.  Recommendations may be updated based on patient status, additional functional criteria and insurance authorization.  Follow Up Recommendations  Home health PT     Assistance Recommended at Discharge Intermittent Supervision/Assistance  Patient can return home with the following A little help with walking and/or transfers;A little help with bathing/dressing/bathroom;Help with stairs or ramp for entrance   Equipment Recommendations  Rolling walker (2 wheels);BSC/3in1    Recommendations for Other Services       Precautions / Restrictions Precautions Precautions: Fall Restrictions Weight Bearing Restrictions: Yes RLE Weight Bearing: Partial weight bearing     Mobility  Bed Mobility Overal bed mobility: Needs Assistance Bed Mobility: Supine to Sit     Supine to sit: Min guard     General bed mobility comments: needs assist with sequencing of R LE. Able to push up on hands and scoot towards EOB    Transfers Overall transfer level: Needs assistance Equipment used: Rolling walker (2 wheels) Transfers: Sit to/from Stand Sit to Stand: Min guard           General transfer comment: cues for hand placement    Ambulation/Gait Ambulation/Gait assistance: Min guard Gait  Distance (Feet): 50 Feet Assistive device: Rolling walker (2 wheels) Gait Pattern/deviations: Step-to pattern       General Gait Details: slow gait speed with RW. Fatigues quickly.   Stairs             Wheelchair Mobility    Modified Rankin (Stroke Patients Only)       Balance Overall balance assessment: Needs assistance Sitting-balance support: Feet supported Sitting balance-Leahy Scale: Good     Standing balance support: No upper extremity supported, During functional activity Standing balance-Leahy Scale: Fair                              Cognition Arousal/Alertness: Awake/alert Behavior During Therapy: WFL for tasks assessed/performed Overall Cognitive Status: Within Functional Limits for tasks assessed                                 General Comments: more flat this date        Exercises Other Exercises Other Exercises: ambulated to bathroom with cga. Min assist for transfers. Safe technique. Able to perform hygiene with supervision. Other Exercises: seated ther-ex including scap retractions, LAQ, alt marching, and hip add squeezes. 5 reps and written HEP given and reviewed.    General Comments        Pertinent Vitals/Pain Pain Assessment Pain Assessment: Faces Faces Pain Scale: Hurts little more Pain Location: R hip Pain Descriptors / Indicators: Operative site guarding Pain Intervention(s): Limited activity within patient's tolerance    Home Living  Prior Function            PT Goals (current goals can now be found in the care plan section) Acute Rehab PT Goals Patient Stated Goal: to get out of hospital PT Goal Formulation: With patient Time For Goal Achievement: 12/26/21 Potential to Achieve Goals: Good Progress towards PT goals: Progressing toward goals    Frequency    BID      PT Plan Current plan remains appropriate    Co-evaluation              AM-PAC  PT "6 Clicks" Mobility   Outcome Measure  Help needed turning from your back to your side while in a flat bed without using bedrails?: A Little Help needed moving from lying on your back to sitting on the side of a flat bed without using bedrails?: A Little Help needed moving to and from a bed to a chair (including a wheelchair)?: A Little Help needed standing up from a chair using your arms (e.g., wheelchair or bedside chair)?: A Little Help needed to walk in hospital room?: A Little Help needed climbing 3-5 steps with a railing? : A Lot 6 Click Score: 17    End of Session Equipment Utilized During Treatment: Gait belt Activity Tolerance: Patient limited by pain Patient left: in chair;with nursing/sitter in room Nurse Communication: Mobility status PT Visit Diagnosis: Unsteadiness on feet (R26.81);Muscle weakness (generalized) (M62.81);Difficulty in walking, not elsewhere classified (R26.2);Pain Pain - Right/Left: Right Pain - part of body: Hip     Time: 1324-4010 PT Time Calculation (min) (ACUTE ONLY): 27 min  Charges:  $Gait Training: 8-22 mins $Therapeutic Exercise: 8-22 mins                     Greggory Stallion, PT, DPT, GCS [phone]    Berlie Hatchel 12/13/2021, 10:19 AM

## 2021-12-13 NOTE — Progress Notes (Signed)
Subjective: 2 Days Post-Op Procedure(s) (LRB): INTRAMEDULLARY (IM) NAIL INTERTROCHANTERIC (Right) Patient is sitting up in a chair but feels very lethargic and weak today.  Does not feel she is ready to go home on her own.  Pain is controlled.  Lab work is normal.  Patient reports pain as mild.  Objective:   VITALS:   Vitals:   12/13/21 1100 12/13/21 1121  BP: (!) 82/50 106/73  Pulse: 80 84  Resp:    Temp: 97.7 F (36.5 C)   SpO2: 97% 97%    Neurologically intact Incision: dressing C/D/I  LABS Recent Labs    12/11/21 1633 12/12/21 0348 12/13/21 0723  HGB 13.0 11.9* 12.0  HCT 38.6 34.8* 35.6*  WBC 9.1 6.6 7.3  PLT 188 174 178    Recent Labs    12/11/21 0527 12/11/21 1633 12/12/21 0348 12/13/21 0723  NA 140  --  142 139  K 3.5  --  3.3* 3.7  BUN 10  --  7* 7*  CREATININE 0.80 0.72 0.64 0.70  GLUCOSE 112*  --  104* 114*    Recent Labs    12/11/21 1633  INR 1.1     Assessment/Plan: 2 Days Post-Op Procedure(s) (LRB): INTRAMEDULLARY (IM) NAIL INTERTROCHANTERIC (Right)   Up with therapy Discharge home with home health tomorrow. He will remain partial weightbearing on a walker.

## 2021-12-13 NOTE — Progress Notes (Signed)
PROGRESS NOTE    Katie Franklin  IPJ:[id number] DOB: 1955-07-05 DOA: 12/11/2021 PCP: [name], MD    Brief Narrative:  66 y.o. female with medical history significant of hypertension, COPD, anxiety, prior tobacco abuse, breast cancer s/p lumpectomy with radiation, lung cancer s/p surgery, and GERD who presents after falling out of bed.  She had taken Ambien last night for sleep but does not take this medication regularly.  Overnight patient rolled over and fell out of bed laying onto her right hip.  She denied any trauma to her head or loss of consciousness, but did hit possibly hit her right knee sustaining an abrasion.   On admission to the emergency department patient was noted to be tachycardic with all other vital signs maintained.  Labs relatively unremarkable.  X-rays revealed nondisplaced intertrochanteric right femur fracture.  Dr. Sabra Heck of orthopedics was consulted.  Patient has been given Tdap booster, oxycodone 5 mg p.o., and fentanyl 25 mcg IV.  Orthopedics consulted.  Patient taken to the OR on 8/27.  Status post right hip intramedullary nail for enteric trochanteric right hip fracture.  Tolerated procedure well.  No immediate postoperative complications.   Assessment & Plan:   Principal Problem:   Nondisplaced intertrochanteric fracture of right femur, initial encounter for closed fracture [facility]) Active Problems:   Fall at home, initial encounter   HTN (hypertension)   COPD (chronic obstructive pulmonary disease) (HCC)   Anxiety   History of breast cancer   History of lung cancer   Mixed hyperlipidemia   Gastritis  Nondisplaced intertrochanteric right femur fracture secondary to fall at home Acute.  Patient had rolled bed landing onto her right side.  Found to have nondisplaced intertrochanteric right femur fracture.  Dr. Sabra Heck of orthopedics was consulted. Status post IM nail 8/27 Tolerated procedure well.  No immediate complications Postoperative pain mild  on 8/29 however patient still very unsteady on feet.  Relative hypotension Plan: Continue diet, stress p.o. fluid intake Multimodal pain control PT OT consults Home with home health DVT prophylaxis, subcu Lovenox x14 days Orthopedic follow-up   Essential hypertension Home blood pressure regimen includes atenolol 50 mg daily and spironolactone 25 mg daily. Home regimen has been resumed Ensure pain control   COPD On physical exam lungs sound clear without signs of wheezing.   O2 saturation maintained on room air.  Patient quit smoking. -Albuterol nebs as needed for shortness of breath   Anxiety Home medication regimen includes Xanax 0.25 mg 3 times daily as needed for anxiety. -Continue Xanax   History of breast cancer History of lung cancer   Mixed Hyperlipidemia -Continue atorvastatin and Zetia   GERD h/o gastritis -Continue Protonix   DVT prophylaxis: SQ Lovenox Code Status: Full Family Communication: None today.  Offered to call but patient declined Disposition Plan: Status is: Inpatient Remains inpatient appropriate because: Hip fracture.  Postoperative day #2.  Patient still unsteady on feet.  Feels she would be unsafe at home.  We will continue therapy evaluations.  Tentative plan discharge home 8/30.  Level of care: Med-Surg  Consultants:  Orthopedics  Procedures:  Right hip IM nail 8/27  Antimicrobials: None   Subjective: Seen and examined.  Sitting up in chair.  Postoperative pain mild to moderate.  Endorsing some nausea associated with narcotic use.  Endorsing unsteadiness on feet  Objective: Vitals:   12/13/21 0803 12/13/21 0854 12/13/21 1100 12/13/21 1121  BP: 92/68 (!) 85/70 (!) 82/50 106/73  Pulse: 77  80 84  Resp:  16     Temp: 98.5 F (36.9 C)  97.7 F (36.5 C)   TempSrc:   Oral   SpO2: 94%  97% 97%  Weight:      Height:        Intake/Output Summary (Last 24 hours) at 12/13/2021 1306 Last data filed at 12/12/2021 1636 Gross per 24  hour  Intake 420 ml  Output --  Net 420 ml   Filed Weights   12/11/21 0515  Weight: 54 kg    Examination:  General exam: NAD Respiratory system: Lungs clear.  Normal work of breathing.  Room air Cardiovascular system: S1-S2, RRR, no murmurs, no pedal edema Gastrointestinal system: Soft, NT/ND, normal bowel sounds Central nervous system: Alert and oriented. No focal neurological deficits. Extremities: Right hip surgical dressing CDI Skin: No rashes, lesions or ulcers Psychiatry: Judgement and insight appear normal. Mood & affect appropriate.     Data Reviewed: I have personally reviewed following labs and imaging studies  CBC: Recent Labs  Lab 12/11/21 0527 12/11/21 1633 12/12/21 0348 12/13/21 0723  WBC 7.5 9.1 6.6 7.3  NEUTROABS 5.2  --   --   --   HGB 12.6 13.0 11.9* 12.0  HCT 38.1 38.6 34.8* 35.6*  MCV 92.3 92.6 91.3 92.2  PLT 202 188 174 502   Basic Metabolic Panel: Recent Labs  Lab 12/11/21 0527 12/11/21 1633 12/12/21 0348 12/13/21 0723  NA 140  --  142 139  K 3.5  --  3.3* 3.7  CL 108  --  107 109  CO2 26  --  24 24  GLUCOSE 112*  --  104* 114*  BUN 10  --  7* 7*  CREATININE 0.80 0.72 0.64 0.70  CALCIUM 9.0  --  8.7* 8.8*   GFR: Estimated Creatinine Clearance: 57.4 mL/min (by C-G formula based on SCr of 0.7 mg/dL). Liver Function Tests: Recent Labs  Lab 12/11/21 0527  AST 15  ALT 13  ALKPHOS 39  BILITOT 1.1  PROT 6.8  ALBUMIN 3.9   No results for input(s): "LIPASE", "AMYLASE" in the last 168 hours. No results for input(s): "AMMONIA" in the last 168 hours. Coagulation Profile: Recent Labs  Lab 12/11/21 1633  INR 1.1   Cardiac Enzymes: No results for input(s): "CKTOTAL", "CKMB", "CKMBINDEX", "TROPONINI" in the last 168 hours. BNP (last 3 results) No results for input(s): "PROBNP" in the last 8760 hours. HbA1C: No results for input(s): "HGBA1C" in the last 72 hours. CBG: No results for input(s): "GLUCAP" in the last 168  hours. Lipid Profile: No results for input(s): "CHOL", "HDL", "LDLCALC", "TRIG", "CHOLHDL", "LDLDIRECT" in the last 72 hours. Thyroid Function Tests: No results for input(s): "TSH", "T4TOTAL", "FREET4", "T3FREE", "THYROIDAB" in the last 72 hours. Anemia Panel: No results for input(s): "VITAMINB12", "FOLATE", "FERRITIN", "TIBC", "IRON", "RETICCTPCT" in the last 72 hours. Sepsis Labs: No results for input(s): "PROCALCITON", "LATICACIDVEN" in the last 168 hours.  No results found for this or any previous visit (from the past 240 hour(s)).       Radiology Studies: DG HIP UNILAT WITH PELVIS 2-3 VIEWS RIGHT  Addendum Date: 12/11/2021   ADDENDUM REPORT: 12/11/2021 16:04 ADDENDUM: Imaging of the entire femur was performed. No other fracture, subluxation or dislocation identified. No knee effusion is identified. No focal bony lesions are noted. Electronically Signed   By: Margarette Canada M.D.   On: 12/11/2021 16:04   Result Date: 12/11/2021 CLINICAL DATA:  ORIF RIGHT femur fracture. EXAM: DG HIP (WITH OR WITHOUT PELVIS) 2V  RIGHT COMPARISON:  Prior studies FINDINGS: Nail/screw fixation of the proximal RIGHT femur noted, traversing a intertrochanteric fracture in near-anatomic alignment and position. No complicating features are identified. IMPRESSION: ORIF of intertrochanteric RIGHT femur fracture. Electronically Signed: By: Margarette Canada M.D. On: 12/11/2021 15:01        Scheduled Meds:  atenolol  50 mg Oral Daily   atorvastatin  10 mg Oral Daily   chlorhexidine   Topical Once   enoxaparin (LOVENOX) injection  30 mg Subcutaneous Q24H   ezetimibe  10 mg Oral Daily   ferrous sulfate  325 mg Oral Q breakfast   pantoprazole  40 mg Oral Daily   pregabalin  25 mg Oral BID   senna  1 tablet Oral BID   spironolactone  25 mg Oral Daily   Continuous Infusions:  sodium chloride Stopped (12/12/21 0548)     LOS: 2 days     Sidney Ace, MD [facility]   If 7PM-7AM, please contact  night-coverage  12/13/2021, 1:06 PM

## 2021-12-13 NOTE — Progress Notes (Signed)
Physical Therapy Treatment Patient Details Name: Katie Franklin MRN: [id number] DOB: June 03, 1955 Today's Date: 12/13/2021   History of Present Illness Pt admitted for R femur fracture with complaints of fall OOB and is s/p IM nailing on 12/11/21. History includes breast ca, COPD, and GERD.    PT Comments    Pt is making good progress towards goals with ability to perform stair training for the 1 step they have to enter home. RW used and demonstrated. Safe technique with improved gait distance this date. BP in standing 105/63 and no complaints of dizziness at this time. Will continue to progress as able.   Recommendations for follow up therapy are one component of a multi-disciplinary discharge planning process, led by the attending physician.  Recommendations may be updated based on patient status, additional functional criteria and insurance authorization.  Follow Up Recommendations  Home health PT     Assistance Recommended at Discharge Intermittent Supervision/Assistance  Patient can return home with the following A little help with walking and/or transfers;A little help with bathing/dressing/bathroom;Help with stairs or ramp for entrance   Equipment Recommendations  Rolling walker (2 wheels);BSC/3in1    Recommendations for Other Services       Precautions / Restrictions Precautions Precautions: Fall Restrictions Weight Bearing Restrictions: Yes RLE Weight Bearing: Partial weight bearing     Mobility  Bed Mobility Overal bed mobility: Needs Assistance Bed Mobility: Supine to Sit     Supine to sit: Min guard     General bed mobility comments: received in chair    Transfers Overall transfer level: Needs assistance Equipment used: Rolling walker (2 wheels) Transfers: Sit to/from Stand Sit to Stand: Supervision           General transfer comment: improved technique with upright posture    Ambulation/Gait Ambulation/Gait assistance: Min guard Gait Distance  (Feet): 120 Feet Assistive device: Rolling walker (2 wheels) Gait Pattern/deviations: Step-to pattern       General Gait Details: ambulated with step to gait pattern and occasional cues for WBing. RW used with safe technique   Stairs Stairs: Yes Stairs assistance: Min assist Stair Management: No rails, Forwards Number of Stairs: 1 General stair comments: up/down 1 platform step with safe technique   Wheelchair Mobility    Modified Rankin (Stroke Patients Only)       Balance Overall balance assessment: Needs assistance Sitting-balance support: Feet supported Sitting balance-Leahy Scale: Good     Standing balance support: No upper extremity supported, During functional activity Standing balance-Leahy Scale: Fair                              Cognition Arousal/Alertness: Awake/alert Behavior During Therapy: WFL for tasks assessed/performed Overall Cognitive Status: Within Functional Limits for tasks assessed                                 General Comments: pleasant and agreeable to session        Exercises Other Exercises Other Exercises: ambulated to bathroom with cga. Min assist for transfers. Safe technique. Able to perform hygiene with supervision. Other Exercises: seated ther-ex performed x 10 reps including AP, heel raises, B shoulder flexion, and shoulder rolls. Reviewed rest of written HEP. Supervision given    General Comments        Pertinent Vitals/Pain Pain Assessment Pain Assessment: Faces Faces Pain Scale: Hurts little more Pain Location: R  hip Pain Descriptors / Indicators: Operative site guarding Pain Intervention(s): Limited activity within patient's tolerance    Home Living                          Prior Function            PT Goals (current goals can now be found in the care plan section) Acute Rehab PT Goals Patient Stated Goal: to get out of hospital PT Goal Formulation: With patient Time For  Goal Achievement: 12/26/21 Potential to Achieve Goals: Good Progress towards PT goals: Progressing toward goals    Frequency    BID      PT Plan Current plan remains appropriate    Co-evaluation              AM-PAC PT "6 Clicks" Mobility   Outcome Measure  Help needed turning from your back to your side while in a flat bed without using bedrails?: A Little Help needed moving from lying on your back to sitting on the side of a flat bed without using bedrails?: A Little Help needed moving to and from a bed to a chair (including a wheelchair)?: A Little Help needed standing up from a chair using your arms (e.g., wheelchair or bedside chair)?: A Little Help needed to walk in hospital room?: A Little Help needed climbing 3-5 steps with a railing? : A Little 6 Click Score: 18    End of Session Equipment Utilized During Treatment: Gait belt Activity Tolerance: Patient limited by pain Patient left: in chair Nurse Communication: Mobility status PT Visit Diagnosis: Unsteadiness on feet (R26.81);Muscle weakness (generalized) (M62.81);Difficulty in walking, not elsewhere classified (R26.2);Pain Pain - Right/Left: Right Pain - part of body: Hip     Time: 4481-8563 PT Time Calculation (min) (ACUTE ONLY): 23 min  Charges:  $Gait Training: 8-22 mins $Therapeutic Exercise: 8-22 mins                     Greggory Stallion, PT, DPT, GCS [phone]    Katie Franklin 12/13/2021, 3:51 PM

## 2021-12-14 DIAGNOSIS — S72001S Fracture of unspecified part of neck of right femur, sequela: Secondary | ICD-10-CM | POA: Diagnosis not present

## 2021-12-14 DIAGNOSIS — F419 Anxiety disorder, unspecified: Secondary | ICD-10-CM | POA: Diagnosis not present

## 2021-12-14 DIAGNOSIS — I959 Hypotension, unspecified: Secondary | ICD-10-CM | POA: Diagnosis not present

## 2021-12-14 DIAGNOSIS — J449 Chronic obstructive pulmonary disease, unspecified: Secondary | ICD-10-CM | POA: Diagnosis not present

## 2021-12-14 DIAGNOSIS — S72009A Fracture of unspecified part of neck of unspecified femur, initial encounter for closed fracture: Secondary | ICD-10-CM

## 2021-12-14 MED ORDER — ENOXAPARIN SODIUM 30 MG/0.3ML IJ SOSY: 30.0000 mg | PREFILLED_SYRINGE | INTRAMUSCULAR | 0 refills | Status: DC

## 2021-12-14 MED ORDER — ZOLPIDEM TARTRATE 10 MG PO TABS: 5.0000 mg | ORAL_TABLET | Freq: Every evening | ORAL | 0 refills | Status: AC | PRN

## 2021-12-14 MED ORDER — POLYETHYLENE GLYCOL 3350 17 G PO PACK: 17.0000 g | PACK | Freq: Every day | ORAL | 0 refills | Status: AC | PRN

## 2021-12-14 MED ORDER — OXYCODONE HCL 5 MG PO TABS: 5.0000 mg | ORAL_TABLET | Freq: Four times a day (QID) | ORAL | 0 refills | Status: DC | PRN

## 2021-12-14 NOTE — Assessment & Plan Note (Signed)
Follow up as outpatient.  

## 2021-12-14 NOTE — Assessment & Plan Note (Signed)
Post right hip fracture requiring operative repair.  Patient had intramedullary nailing procedure by Dr. Sabra Heck on 12/11/2021.  Patient did well with physical therapy and I recommended home with home health.  A few pain pills were prescribed.  Lovenox injections for 14 days prescribed.

## 2021-12-14 NOTE — Progress Notes (Signed)
Physical Therapy Treatment Patient Details Name: Katie Franklin MRN: [id number] DOB: 12/19/1955 Today's Date: 12/14/2021   History of Present Illness Pt admitted for R femur fracture with complaints of fall OOB and is s/p IM nailing on 12/11/21. History includes breast ca, COPD, and GERD.    PT Comments    Ambulated in hallway with improved gait pattern and speed. Good sequencing and pt excited for dc this date. Reviewed mobility at home and car transfers. Good to dc this date.   Recommendations for follow up therapy are one component of a multi-disciplinary discharge planning process, led by the attending physician.  Recommendations may be updated based on patient status, additional functional criteria and insurance authorization.  Follow Up Recommendations  Home health PT     Assistance Recommended at Discharge Set up Supervision/Assistance  Patient can return home with the following A little help with walking and/or transfers;A little help with bathing/dressing/bathroom;Help with stairs or ramp for entrance   Equipment Recommendations  Rolling walker (2 wheels);BSC/3in1    Recommendations for Other Services       Precautions / Restrictions Precautions Precautions: Fall Restrictions Weight Bearing Restrictions: Yes RLE Weight Bearing: Weight bearing as tolerated     Mobility  Bed Mobility Overal bed mobility: Needs Assistance Bed Mobility: Supine to Sit     Supine to sit: Min guard     General bed mobility comments: cues for sequencing and able to hook under right foot to assist with sliding to the EOB. Once seated at EOB, able to sit with upright posture.    Transfers Overall transfer level: Needs assistance Equipment used: Rolling walker (2 wheels) Transfers: Sit to/from Stand Sit to Stand: Supervision           General transfer comment: improved technique with upright posture    Ambulation/Gait Ambulation/Gait assistance: Min guard Gait Distance (Feet):  150 Feet Assistive device: Rolling walker (2 wheels) Gait Pattern/deviations: Step-through pattern       General Gait Details: ambulated with step to gait pattern and cues for upright posture. Safe technique   Stairs             Wheelchair Mobility    Modified Rankin (Stroke Patients Only)       Balance Overall balance assessment: Needs assistance Sitting-balance support: Feet supported Sitting balance-Leahy Scale: Good     Standing balance support: No upper extremity supported, During functional activity Standing balance-Leahy Scale: Fair                              Cognition Arousal/Alertness: Awake/alert Behavior During Therapy: WFL for tasks assessed/performed Overall Cognitive Status: Within Functional Limits for tasks assessed                                          Exercises Other Exercises Other Exercises: educated on car transfers, HEP, and mobility at home    General Comments        Pertinent Vitals/Pain Pain Assessment Pain Assessment: 0-10 Pain Score: 8  Pain Location: R hip Pain Descriptors / Indicators: Operative site guarding Pain Intervention(s): Limited activity within patient's tolerance, Premedicated before session    Home Living                          Prior Function  PT Goals (current goals can now be found in the care plan section) Acute Rehab PT Goals Patient Stated Goal: to get out of hospital PT Goal Formulation: With patient Time For Goal Achievement: 12/26/21 Potential to Achieve Goals: Good Progress towards PT goals: Progressing toward goals    Frequency    BID      PT Plan Current plan remains appropriate    Co-evaluation              AM-PAC PT "6 Clicks" Mobility   Outcome Measure  Help needed turning from your back to your side while in a flat bed without using bedrails?: A Little Help needed moving from lying on your back to sitting on the  side of a flat bed without using bedrails?: A Little Help needed moving to and from a bed to a chair (including a wheelchair)?: A Little Help needed standing up from a chair using your arms (e.g., wheelchair or bedside chair)?: A Little Help needed to walk in hospital room?: A Little Help needed climbing 3-5 steps with a railing? : A Little 6 Click Score: 18    End of Session   Activity Tolerance: Patient limited by pain Patient left: in chair Nurse Communication: Mobility status PT Visit Diagnosis: Unsteadiness on feet (R26.81);Muscle weakness (generalized) (M62.81);Difficulty in walking, not elsewhere classified (R26.2);Pain Pain - Right/Left: Right Pain - part of body: Hip     Time: 5400-8676 PT Time Calculation (min) (ACUTE ONLY): 20 min  Charges:  $Gait Training: 8-22 mins                     Katie Franklin, PT, DPT, GCS [phone]    Katie Franklin 12/14/2021, 10:25 AM

## 2021-12-14 NOTE — Discharge Instructions (Signed)
Hold atenolol and spironolactone for now Recommend getting a blood pressure cuff and checking BP at home

## 2021-12-14 NOTE — Assessment & Plan Note (Signed)
With her blood pressure being on the lower side, I held her atenolol and spironolactone.  Advised to get a blood pressure cuff and check her blood pressures at home.

## 2021-12-14 NOTE — Discharge Summary (Signed)
Physician Discharge Summary   Patient: Katie Franklin MRN: [id number] DOB: 1955/05/27  Admit date:     12/11/2021  Discharge date: 12/14/21  Discharge Physician: Loletha Grayer   PCP: [name], MD   Recommendations at discharge:  1.  Follow-up PCP 5 days 2.  Follow-up orthopedic surgery 2 weeks  Discharge Diagnoses: Principal Problem:   Hip fracture requiring operative repair [facility]) Active Problems:   Nondisplaced intertrochanteric fracture of right femur, initial encounter for closed fracture ([place])   Fall at home, initial encounter   Hypotension   HTN (hypertension)   COPD (chronic obstructive pulmonary disease) ([place])   Anxiety   History of breast cancer   History of lung cancer   Mixed hyperlipidemia   Gastritis    Hospital Course: The patient was admitted to the hospital on 12/11/2021.  Patient had a nondisplaced intertrochanteric fracture of the right femur.  Dr. Sabra Heck brought to the operating room and did a nailing procedure on 12/11/2021.  Lovenox injections to prevent DVT.  Pain control prescribed.  Patient did well enough with physical therapy that they recommended home with home health.  The patient's blood pressure was on the lower side so I held her atenolol and Aldactone.  I recommended following up as outpatient to check blood pressure.  I advised the patient to get a blood pressure cuff and check her blood pressure at home.  If her blood pressure starts rising she can go back on 1 medication but not two.  Assessment and Plan: * Hip fracture requiring operative repair [facility]) Post right hip fracture requiring operative repair.  Patient had intramedullary nailing procedure by Dr. Sabra Heck on 12/11/2021.  Patient did well with physical therapy and I recommended home with home health.  A few pain pills were prescribed.  Lovenox injections for 14 days prescribed.  Hypotension With her blood pressure being on the lower side, I held her atenolol and spironolactone.   Advised to get a blood pressure cuff and check her blood pressures at home.  COPD (chronic obstructive pulmonary disease) (H[facility]) Respiratory status stable  Anxiety Continue Xanax  History of breast cancer Follow-up as outpatient  History of lung cancer Follow-up as outpatient  Mixed hyperlipidemia Continue Lipitor and Zetia  Gastritis Continue Protonix         Consultants: Orthopedic surgery Procedures performed: Intramedullary nail procedure Disposition: Home health Diet recommendation:  Cardiac diet DISCHARGE MEDICATION: Allergies as of 12/14/2021       Reactions   Erythromycin Anaphylaxis, Rash   Septra Ds [sulfamethoxazole-trimethoprim] Anaphylaxis   Neck swelling as well    Sulfa Antibiotics Anaphylaxis   Sulfasalazine Anaphylaxis   Tetracyclines & Related Hives, Itching   Aspirin Other (See Comments)   Abdominal Pain   Demeclocycline    Amoxicillin Rash   Latex Rash   Plastic bandaids/ gloves        Medication List     STOP taking these medications    atenolol 50 MG tablet Commonly known as: TENORMIN   denosumab 60 MG/ML Sosy injection Commonly known as: PROLIA   Propylene Glycol-Glycerin 0.6-0.6 % Soln   spironolactone 50 MG tablet Commonly known as: ALDACTONE   traMADol 50 MG tablet Commonly known as: ULTRAM       TAKE these medications    acetaminophen 500 MG tablet Commonly known as: TYLENOL Take 500-1,000 mg by mouth 2 (two) times daily as needed for moderate pain (FOR HEADACHES/BACK PAIN.).   albuterol 108 (90 Base) MCG/ACT inhaler Commonly known as:  VENTOLIN HFA Inhale 2 puffs into the lungs every 6 (six) hours as needed for wheezing or shortness of breath.   ALPRAZolam 0.25 MG tablet Commonly known as: XANAX Take 0.25 mg by mouth 3 (three) times daily as needed for anxiety.   atorvastatin 10 MG tablet Commonly known as: LIPITOR Take 1 tablet (10 mg total) by mouth daily.   CALTRATE 600+D PO Take 1 tablet by mouth  daily. Chewable   cyanocobalamin 1000 MCG tablet Commonly known as: VITAMIN B12 Take 1,000 mcg by mouth daily.   enoxaparin 30 MG/0.3ML injection Commonly known as: LOVENOX Inject 0.3 mLs (30 mg total) into the skin daily for 14 days. Start taking on: December 15, 2021   ezetimibe 10 MG tablet Commonly known as: ZETIA Take 1 tablet (10 mg total) by mouth daily.   hydroxypropyl methylcellulose / hypromellose 2.5 % ophthalmic solution Commonly known as: ISOPTO TEARS / GONIOVISC Place 1 drop into both eyes as needed for dry eyes.   Lifitegrast 5 % Soln Place 1 drop into both eyes 2 (two) times daily as needed (for excessively dry eyes.).   oxyCODONE 5 MG immediate release tablet Commonly known as: Oxy IR/ROXICODONE Take 1 tablet (5 mg total) by mouth every 6 (six) hours as needed for severe pain.   pantoprazole 40 MG tablet Commonly known as: PROTONIX Take 40 mg by mouth daily.   polyethylene glycol 17 g packet Commonly known as: MIRALAX / GLYCOLAX Take 17 g by mouth daily as needed for mild constipation.   pregabalin 25 MG capsule Commonly known as: LYRICA Take 25 mg by mouth 2 (two) times daily.   PRESERVISION AREDS 2+MULTI VIT PO Take by mouth in the morning and at bedtime.   Systane Ultra PF 0.4-0.3 % Soln Generic drug: Polyethyl Glyc-Propyl Glyc PF Apply 1 drop to eye as needed (both eye for dry eyes).   vitamin E 1000 UNIT capsule Take 1,000 Units by mouth daily.   zolpidem 10 MG tablet Commonly known as: AMBIEN Take 0.5 tablets (5 mg total) by mouth at bedtime as needed for sleep. What changed: how much to take               Durable Medical Equipment  (From admission, onward)           Start     Ordered   12/12/21 1548  For home use only DME Walker rolling  Once       Question Answer Comment  Walker: With [place]   Patient needs a walker to treat with the following condition Difficulty walking      12/12/21 1547   12/12/21 1441  For  home use only DME 3 n 1  Once        12/12/21 1440   12/12/21 1440  For home use only DME Walker rolling  Once       Question Answer Comment  Walker: With [place]   Patient needs a walker to treat with the following condition Weakness generalized      12/12/21 1440            Follow-up Information     Earnestine Leys, MD Follow up on 12/28/2021.   Specialty: Orthopedic Surgery Why: For wound re-check Please make an appointment for the patient prior to her discharge. at [place] information: [place] [address] [phone]         [name], MD Follow up on 12/22/2021.  Specialty: Infectious Diseases Why: at [facility] information: [place] [address] [phone]                Discharge Exam: Danley Danker Weights   12/11/21 0515  Weight: 54 kg   Physical Exam HENT:     Head: Normocephalic.     Mouth/Throat:     Pharynx: No oropharyngeal exudate.  Eyes:     General: Lids are normal.     Conjunctiva/sclera: Conjunctivae normal.  Cardiovascular:     Rate and Rhythm: Normal rate and regular rhythm.     Heart sounds: Normal heart sounds, S1 normal and S2 normal.  Pulmonary:     Breath sounds: Normal breath sounds. No decreased breath sounds, wheezing, rhonchi or rales.  Abdominal:     Palpations: Abdomen is soft.     Tenderness: There is no abdominal tenderness.  Musculoskeletal:     Right lower leg: No swelling.     Left lower leg: No swelling.  Skin:    General: Skin is warm.     Findings: No rash.  Neurological:     Mental Status: She is alert and oriented to person, place, and time.      Condition at discharge: stable  The results of significant diagnostics from this hospitalization (including imaging, microbiology, ancillary and laboratory) are listed below for reference.   Imaging Studies: DG HIP UNILAT WITH PELVIS 2-3 VIEWS RIGHT  Addendum Date: 12/11/2021   ADDENDUM  REPORT: 12/11/2021 16:04 ADDENDUM: Imaging of the entire femur was performed. No other fracture, subluxation or dislocation identified. No knee effusion is identified. No focal bony lesions are noted. Electronically Signed   By: Margarette Canada M.D.   On: 12/11/2021 16:04   Result Date: 12/11/2021 CLINICAL DATA:  ORIF RIGHT femur fracture. EXAM: DG HIP (WITH OR WITHOUT PELVIS) 2V RIGHT COMPARISON:  Prior studies FINDINGS: Nail/screw fixation of the proximal RIGHT femur noted, traversing a intertrochanteric fracture in near-anatomic alignment and position. No complicating features are identified. IMPRESSION: ORIF of intertrochanteric RIGHT femur fracture. Electronically Signed: By: Margarette Canada M.D. On: 12/11/2021 15:01   DG HIP UNILAT WITH PELVIS 2-3 VIEWS RIGHT  Addendum Date: 12/11/2021   ADDENDUM REPORT: 12/11/2021 13:14 ADDENDUM: In 1 of the images, there is linear density seen in the shaft of femur below the level of the intramedullary rod. This may be an artifact or suggest undisplaced fracture. Please consider radiographs of right femur. These results will be called to the ordering clinician or representative by the Radiologist Assistant, and communication documented in the PACS or [employer]. Electronically Signed   By: Elmer Picker M.D.   On: 12/11/2021 13:14   Result Date: 12/11/2021 CLINICAL DATA:  Fluoroscopic assistance for internal fixation EXAM: DG HIP (WITH OR WITHOUT PELVIS) 2-3V RIGHT COMPARISON:  Study done earlier today FINDINGS: There is interval internal fixation of intertrochanteric fracture of proximal right femur with intramedullary rod. Fluoroscopic time 56 seconds. Fluoroscopic dose is not provided. IMPRESSION: Fluoroscopic assistance was provided for internal fixation of intertrochanteric fracture of right femur. Electronically Signed: By: Elmer Picker M.D. On: 12/11/2021 13:09   DG C-Arm 1-60 Min-No Report  Result Date: 12/11/2021 Fluoroscopy was utilized by  the requesting physician.  No radiographic interpretation.   DG Chest Portable 1 View  Result Date: 12/11/2021 CLINICAL DATA:  Right hip pain. EXAM: PORTABLE CHEST 1 VIEW COMPARISON:  November 27, 2017. FINDINGS: The heart size and mediastinal contours are within normal limits. Postsurgical changes are noted  in the left upper lobe. No acute pulmonary disease is noted. The visualized skeletal structures are unremarkable. IMPRESSION: No active disease. Electronically Signed   By: Marijo Conception M.D.   On: 12/11/2021 09:16   DG Femur Min 2 Views Right  Result Date: 12/11/2021 CLINICAL DATA:  66 year old female status post fall from bed. Right hip pain. EXAM: RIGHT FEMUR 2 VIEWS COMPARISON:  CT Abdomen and Pelvis 02/07/2021. FINDINGS: Bone mineralization is within normal limits for age. Right femoral head normally located. Visible right hemipelvis appears intact. Subtle lucency in the intertrochanteric segment of the right femur, with evidence of cortical irregularity at the greater trochanter. This is nondisplaced. Elsewhere the right femur appears intact. Right knee alignment appears maintained. IMPRESSION: Positive for evidence of subtle, nondisplaced intertrochanteric right femur fracture. Electronically Signed   By: Genevie Ann M.D.   On: 12/11/2021 06:52   DG Pelvis 1-2 Views  Result Date: 12/11/2021 CLINICAL DATA:  Fall onto right hip EXAM: PELVIS - 1-2 VIEW COMPARISON:  Concurrently obtained radiographs of the right femur FINDINGS: Subtle lucency in the intertrochanteric region of the right femur concerning for nondisplaced intertrochanteric fracture. The bony pelvis appears to be intact. Bowel gas pattern is unremarkable. IMPRESSION: Suspect nondisplaced right intertrochanteric femoral neck fracture. Electronically Signed   By: Jacqulynn Cadet M.D.   On: 12/11/2021 06:52   US Venous Img Upper Uni Left (DVT)  Result Date: 11/16/2021 CLINICAL DATA:  Left upper extremity swelling, history of lung  carcinoma EXAM: LEFT UPPER EXTREMITY VENOUS DOPPLER ULTRASOUND TECHNIQUE: Gray-scale sonography with graded compression, as well as color Doppler and duplex ultrasound were performed to evaluate the upper extremity deep venous system from the level of the subclavian vein and including the jugular, axillary, basilic, radial, ulnar and upper cephalic vein. Spectral Doppler was utilized to evaluate flow at rest and with distal augmentation maneuvers. COMPARISON:  CT chest 11/02/2021 and previous FINDINGS: Contralateral Subclavian Vein: Respiratory phasicity is normal and symmetric with the symptomatic side. No evidence of thrombus. Normal compressibility. Internal Jugular Vein: No evidence of thrombus. Normal compressibility, respiratory phasicity and response to augmentation. Subclavian Vein: No evidence of thrombus. Normal compressibility, respiratory phasicity and response to augmentation. Axillary Vein: No evidence of thrombus. Normal compressibility, respiratory phasicity and response to augmentation. Cephalic Vein: No evidence of thrombus. Normal compressibility, respiratory phasicity and response to augmentation. Basilic Vein: No evidence of thrombus. Normal compressibility, respiratory phasicity and response to augmentation. Brachial Veins: No evidence of thrombus. Normal compressibility, respiratory phasicity and response to augmentation. Radial Veins: No evidence of thrombus. Normal compressibility, respiratory phasicity and response to augmentation. Ulnar Veins: No evidence of thrombus. Normal compressibility, respiratory phasicity and response to augmentation. Venous Reflux:  None visualized. Other Findings:  None visualized. IMPRESSION: No evidence of DVT within the left upper extremity. Electronically Signed   By: Lucrezia Europe M.D.   On: 11/16/2021 15:53    Microbiology: Results for orders placed or performed during the hospital encounter of 04/12/20  SARS CORONAVIRUS 2 (TAT 6-24 HRS) Nasopharyngeal  Nasopharyngeal Swab     Status: None   Collection Time: 04/12/20 10:20 AM   Specimen: Nasopharyngeal Swab  Result Value Ref Range Status   SARS Coronavirus 2 NEGATIVE NEGATIVE Final    Comment: (NOTE) SARS-CoV-2 target nucleic acids are NOT DETECTED.  The SARS-CoV-2 RNA is generally detectable in upper and lower respiratory specimens during the acute phase of infection. Negative results do not preclude SARS-CoV-2 infection, do not rule out co-infections with other pathogens, and  should not be used as the sole basis for treatment or other patient management decisions. Negative results must be combined with clinical observations, patient history, and epidemiological information. The expected result is Negative.  Fact Sheet for Patients: SugarRoll.be  Fact Sheet for Healthcare Providers: https://www.woods-mathews.com/  This test is not yet approved or cleared by the [place] FDA and  has been authorized for detection and/or diagnosis of SARS-CoV-2 by FDA under an Emergency Use Authorization (EUA). This EUA will remain  in effect (meaning this test can be used) for the duration of the COVID-19 declaration under Se ction 564(b)(1) of the Act, 21 U.S.C. section 360bbb-3(b)(1), unless the authorization is terminated or revoked sooner.  Performed at [facility], [place] [address], [place], [place] [address]     Labs: CBC: Recent Labs  Lab 12/11/21 [phone] 12/11/21 1633 12/12/21 0348 12/13/21 0723  WBC 7.5 9.1 6.6 7.3  NEUTROABS 5.2  --   --   --   HGB 12.6 13.0 11.9* 12.0  HCT 38.1 38.6 34.8* 35.6*  MCV 92.3 92.6 91.3 92.2  PLT 202 188 174 005   Basic Metabolic Panel: Recent Labs  Lab 12/11/21 0527 12/11/21 1633 12/12/21 0348 12/13/21 0723  NA 140  --  142 139  K 3.5  --  3.3* 3.7  CL 108  --  107 109  CO2 26  --  24 24  GLUCOSE 112*  --  104* 114*  BUN 10  --  7* 7*  CREATININE 0.80 0.72 0.64 0.70  CALCIUM 9.0  --   8.7* 8.8*   Liver Function Tests: Recent Labs  Lab 12/11/21 0527  AST 15  ALT 13  ALKPHOS 39  BILITOT 1.1  PROT 6.8  ALBUMIN 3.9    Discharge time spent: greater than 30 minutes.  Signed: Loletha Grayer, MD [facility] 12/14/2021

## 2021-12-14 NOTE — Assessment & Plan Note (Signed)
-   Continue Xanax 

## 2021-12-14 NOTE — Care Management Important Message (Signed)
Important Message  Patient Details  Name: Katie Franklin MRN: [id number] Date of Birth: 1955/05/03   Medicare Important Message Given:  N/A - LOS <3 / Initial given by admissions     [name] 12/14/2021, 8:30 AM

## 2021-12-14 NOTE — Progress Notes (Signed)
Subjective: 3 Days Post-Op Procedure(s) (LRB): INTRAMEDULLARY (IM) NAIL INTERTROCHANTERIC (Right) Patient is doing much better today and feels stronger.  She is ready for discharge home.  Since she is allergic to aspirin she will take Lovenox.  Pain is better.  We will see her back in the office in 10 days to 2 weeks.  Patient reports pain as mild.  Objective:   VITALS:   Vitals:   12/14/21 0740 12/14/21 1046  BP: (!) 104/56 116/69  Pulse: 77 65  Resp:  18  Temp: 98.1 F (36.7 C) 97.6 F (36.4 C)  SpO2: 97% 99%    Neurologically intact Incision: dressing C/D/I Dressing change prior to discharge.  LABS Recent Labs    12/11/21 1633 12/12/21 0348 12/13/21 0723  HGB 13.0 11.9* 12.0  HCT 38.6 34.8* 35.6*  WBC 9.1 6.6 7.3  PLT 188 174 178    Recent Labs    12/11/21 1633 12/12/21 0348 12/13/21 0723  NA  --  142 139  K  --  3.3* 3.7  BUN  --  7* 7*  CREATININE 0.72 0.64 0.70  GLUCOSE  --  104* 114*    Recent Labs    12/11/21 1633  INR 1.1     Assessment/Plan: 3 Days Post-Op Procedure(s) (LRB): INTRAMEDULLARY (IM) NAIL INTERTROCHANTERIC (Right)   Discharge home with home health  Partial weightbearing right leg with walker  Lovenox for DVT prophylaxis  Appointment has been made for follow-up in my office

## 2021-12-14 NOTE — Assessment & Plan Note (Signed)
Continue Protonix °

## 2021-12-14 NOTE — TOC Progression Note (Signed)
Transition of Care [facility]) - Progression Note    Patient Details  Name: Katie Franklin MRN: [id number] Date of Birth: [birth year]  Transition of Care [facility]) CM/SW [place], RN Phone Number: 12/14/2021, 8:59 AM  Clinical Narrative:    Patient is discharging home with [facility] today, the DME has been delivered to the room   Expected Discharge Plan: [place] Barriers to Discharge: Continued Medical Work up  Expected Discharge Plan and Services Expected Discharge Plan: [place]   Discharge Planning Services: CM Consult   Living arrangements for the past 2 months: Single Family Home Expected Discharge Date: 12/14/21               DME Arranged: Gilford Rile rolling, 3-N-1 DME Agency: [employer] Date DME Agency Contacted: 12/12/21 Time DME Agency Contacted: [phone]   HH Arranged: PT           Social Determinants of Health (SDOH) Interventions    Readmission Risk Interventions     No data to display

## 2021-12-14 NOTE — Assessment & Plan Note (Signed)
Continue Lipitor and Zetia

## 2021-12-14 NOTE — Assessment & Plan Note (Signed)
Respiratory status stable. 

## 2021-12-14 NOTE — Plan of Care (Signed)
Pt AAOx4, moderate pain in R hip. AVS and education provided. IV removed with no complications. Pt transported by [facility] to main lobby where wife will transport her home.

## 2022-01-04 ENCOUNTER — Other Ambulatory Visit: Payer: Self-pay | Admitting: Internal Medicine

## 2022-01-04 DIAGNOSIS — Z1231 Encounter for screening mammogram for malignant neoplasm of breast: Secondary | ICD-10-CM

## 2022-04-13 ENCOUNTER — Ambulatory Visit
Admission: RE | Admit: 2022-04-13 | Discharge: 2022-04-13 | Disposition: A | Discharge status: Home / Self Care | Payer: Medicare Other | Source: Ambulatory Visit | Attending: Internal Medicine | Admitting: Internal Medicine

## 2022-04-13 DIAGNOSIS — Z1231 Encounter for screening mammogram for malignant neoplasm of breast: Secondary | ICD-10-CM | POA: Diagnosis present

## 2022-05-05 ENCOUNTER — Ambulatory Visit
Admission: RE | Admit: 2022-05-05 | Discharge: 2022-05-05 | Disposition: A | Discharge status: Home / Self Care | Payer: Medicare Other | Source: Ambulatory Visit | Attending: Medical Oncology | Admitting: Medical Oncology

## 2022-05-05 ENCOUNTER — Other Ambulatory Visit: Payer: Self-pay | Admitting: *Deleted

## 2022-05-05 DIAGNOSIS — C3412 Malignant neoplasm of upper lobe, left bronchus or lung: Secondary | ICD-10-CM | POA: Diagnosis present

## 2022-05-05 LAB — POCT I-STAT CREATININE: Creatinine, Ser: 0.8 mg/dL (ref 0.44–1.00)

## 2022-05-05 MED ORDER — IOHEXOL 300 MG/ML  SOLN
75.0000 mL | Freq: Once | INTRAMUSCULAR | Status: AC | PRN
  Administered 2022-05-05: 75 mL via INTRAVENOUS

## 2022-05-08 ENCOUNTER — Inpatient Hospital Stay (HOSPITAL_BASED_OUTPATIENT_CLINIC_OR_DEPARTMENT_OTHER): Payer: Medicare Other | Admitting: Internal Medicine

## 2022-05-08 ENCOUNTER — Encounter: Payer: Self-pay | Admitting: Internal Medicine

## 2022-05-08 ENCOUNTER — Inpatient Hospital Stay: Payer: Medicare Other | Attending: Internal Medicine

## 2022-05-08 VITALS — BP 132/83 | HR 67 | Temp 97.0°F | Resp 16 | Wt 116.6 lb

## 2022-05-08 DIAGNOSIS — C3412 Malignant neoplasm of upper lobe, left bronchus or lung: Secondary | ICD-10-CM | POA: Diagnosis not present

## 2022-05-08 DIAGNOSIS — Z853 Personal history of malignant neoplasm of breast: Secondary | ICD-10-CM | POA: Insufficient documentation

## 2022-05-08 DIAGNOSIS — Z170 Estrogen receptor positive status [ER+]: Secondary | ICD-10-CM

## 2022-05-08 DIAGNOSIS — Z85118 Personal history of other malignant neoplasm of bronchus and lung: Secondary | ICD-10-CM | POA: Diagnosis present

## 2022-05-08 DIAGNOSIS — Z08 Encounter for follow-up examination after completed treatment for malignant neoplasm: Secondary | ICD-10-CM | POA: Insufficient documentation

## 2022-05-08 DIAGNOSIS — C50812 Malignant neoplasm of overlapping sites of left female breast: Secondary | ICD-10-CM | POA: Diagnosis not present

## 2022-05-08 LAB — CBC WITH DIFFERENTIAL/PLATELET
Abs Immature Granulocytes: 0.01 10*3/uL (ref 0.00–0.07)
Basophils Absolute: 0 10*3/uL (ref 0.0–0.1)
Basophils Relative: 1 %
Eosinophils Absolute: 0.1 10*3/uL (ref 0.0–0.5)
Eosinophils Relative: 1 %
HCT: 38.1 % (ref 36.0–46.0)
Hemoglobin: 13.4 g/dL (ref 12.0–15.0)
Immature Granulocytes: 0 %
Lymphocytes Relative: 34 %
Lymphs Abs: 1.8 10*3/uL (ref 0.7–4.0)
MCH: 32.2 pg (ref 26.0–34.0)
MCHC: 35.2 g/dL (ref 30.0–36.0)
MCV: 91.6 fL (ref 80.0–100.0)
Monocytes Absolute: 0.5 10*3/uL (ref 0.1–1.0)
Monocytes Relative: 10 %
Neutro Abs: 2.8 10*3/uL (ref 1.7–7.7)
Neutrophils Relative %: 54 %
Platelets: 220 10*3/uL (ref 150–400)
RBC: 4.16 MIL/uL (ref 3.87–5.11)
RDW: 12.2 % (ref 11.5–15.5)
WBC: 5.1 10*3/uL (ref 4.0–10.5)
nRBC: 0 % (ref 0.0–0.2)

## 2022-05-08 LAB — COMPREHENSIVE METABOLIC PANEL
ALT: 14 U/L (ref 0–44)
AST: 18 U/L (ref 15–41)
Albumin: 3.9 g/dL (ref 3.5–5.0)
Alkaline Phosphatase: 34 U/L — ABNORMAL LOW (ref 38–126)
Anion gap: 8 (ref 5–15)
BUN: 8 mg/dL (ref 8–23)
CO2: 28 mmol/L (ref 22–32)
Calcium: 8.9 mg/dL (ref 8.9–10.3)
Chloride: 102 mmol/L (ref 98–111)
Creatinine, Ser: 0.67 mg/dL (ref 0.44–1.00)
GFR, Estimated: >60 mL/min (ref >60)
Glucose, Bld: 98 mg/dL (ref 70–99)
Potassium: 3.8 mmol/L (ref 3.5–5.1)
Sodium: 138 mmol/L (ref 135–145)
Total Bilirubin: 0.8 mg/dL (ref 0.3–1.2)
Total Protein: 7.4 g/dL (ref 6.5–8.1)

## 2022-05-08 NOTE — Progress Notes (Signed)
Had CT with contrast on 05/06/22.  Night after the CT scan her lymph nodes in her neck were swollen and sore.  Took Benadryl and did help with the swelling but still sore today.

## 2022-05-08 NOTE — Progress Notes (Signed)
[place] OFFICE PROGRESS NOTE  Patient Care Team: [name], MD as PCP - General (Infectious Diseases)   Cancer Staging  No matching staging information was found for the patient.   Oncology History Overview Note  # 2012- LEFT BREAST [pt1b- STAGE I; G-2];ER/PR-Pos; her 2 Neu-NEG;  RS- 19 [risk of recurrence 13%];s/p Lumpec& SLNBx; NO chemo; s/p RT; On Aromasin  [finish April 2017]  # MAY 2019- ADENO CA LUL STAGE IA [s/p wedge resection; Dr.Oaks; [facility]]  # Osteoporosis- stopped PO bisphosphonate sec to GI issues; On reclast every 12 months; defer to PCP.  Chronic back pain-Dr.Chasnis   DIAGNOSIS: [ ]  Breast ca Stage I ER/PR pos; Her 2NEG; LUL adeno stage I  GOALS: curative  CURRENT/MOST RECENT THERAPY- surveillance    Carcinoma of overlapping sites of left breast in female, estrogen receptor positive ([place])  Primary cancer of left upper lobe of lung ([place])    INTERVAL HISTORY: alone. Ambulating independently.   Katie Franklin 67 y.o.  female pleasant patient above history of breast cancer; and also left upper lobe lung cancer is here for a follow-up/ review the results of CT scan.   Had CT with contrast on 05/06/22. Night after the CT scan her lymph nodes in her neck were swollen and sore. Took Benadryl and did help with the swelling but still sore today   Patient denies any difficulty breathing or coughing.  Denies any swelling of the tongue.  Most of her symptoms resolved.  Patient continues to complain of left chest wall-since her lung surgery.  She continues to been gabapentin.  This is not any worse.  Review of Systems  Constitutional:  Negative for chills, diaphoresis, fever, malaise/fatigue and weight loss.  HENT:  Negative for nosebleeds and sore throat.   Eyes:  Negative for double vision.  Respiratory:  Negative for cough, hemoptysis, sputum production, shortness of breath and wheezing.   Cardiovascular:  Positive for chest pain. Negative for  palpitations, orthopnea and leg swelling.  Gastrointestinal:  Negative for abdominal pain, blood in stool, constipation, diarrhea, heartburn, melena, nausea and vomiting.  Genitourinary:  Negative for dysuria, frequency and urgency.  Musculoskeletal:  Negative for back pain and joint pain.  Skin: Negative.  Negative for itching and rash.  Neurological:  Negative for dizziness, tingling, focal weakness, weakness and headaches.  Endo/Heme/Allergies:  Does not bruise/bleed easily.  Psychiatric/Behavioral:  Negative for depression. The patient is not nervous/anxious and does not have insomnia.       PAST MEDICAL HISTORY :  Past Medical History:  Diagnosis Date   Breast cancer ([place]) 2012   left breast   COPD (chronic obstructive pulmonary disease) ([place])    Deafness in left ear    Depression    Diverticulosis    Ductal carcinoma of left breast ([place]) 2012   Family history of adverse reaction to anesthesia    mother got sick   Gastritis    GERD (gastroesophageal reflux disease)    H. pylori infection    H/O herpes labialis    Hemorrhoids    History of chicken pox    Hyperplastic colon polyp    Hypertension    Lung cancer ([place]) 08/2017   surgery only, no rad or chemo tx   Multinodular goiter (nontoxic)    Osteoporosis    Panic disorder    Personal history of radiation therapy    Recurrent sinusitis    Recurrent sinusitis    Tobacco use    Tubular  adenoma of colon     PAST SURGICAL HISTORY :   Past Surgical History:  Procedure Laterality Date   BACK SURGERY  2018   lumbar   BREAST EXCISIONAL BIOPSY Left    negative 1980's twice   BREAST EXCISIONAL BIOPSY Left 2012   [facility]   BREAST LUMPECTOMY Left 2012   [facility], clear margins, LN neg   BUNIONECTOMY Right    CHOLECYSTECTOMY N/A 06/11/2017   Procedure: LAPAROSCOPIC CHOLECYSTECTOMY;  Surgeon: Herbert Pun, MD;  Location: [facility] ORS;  Service: General;  Laterality: N/A;   COLONOSCOPY N/A 05/17/2021   Procedure:  COLONOSCOPY;  Surgeon: Lesly Rubenstein, MD;  Location: [facility] ENDOSCOPY;  Service: Endoscopy;  Laterality: N/A;   COLONOSCOPY WITH PROPOFOL N/A 12/28/2014   Procedure: COLONOSCOPY WITH PROPOFOL;  Surgeon: Lollie Sails, MD;  Location: [facility] ENDOSCOPY;  Service: Endoscopy;  Laterality: N/A;   COLONOSCOPY WITH PROPOFOL N/A 08/17/2017   Procedure: COLONOSCOPY WITH PROPOFOL;  Surgeon: Lollie Sails, MD;  Location: [facility] ENDOSCOPY;  Service: Endoscopy;  Laterality: N/A;   cyst throat     base of tongue   ESOPHAGOGASTRODUODENOSCOPY     ESOPHAGOGASTRODUODENOSCOPY N/A 04/13/2020   Procedure: ESOPHAGOGASTRODUODENOSCOPY (EGD);  Surgeon: Lesly Rubenstein, MD;  Location: [facility] ENDOSCOPY;  Service: Endoscopy;  Laterality: N/A;   ESOPHAGOGASTRODUODENOSCOPY (EGD) WITH PROPOFOL N/A 12/02/2015   Procedure: ESOPHAGOGASTRODUODENOSCOPY (EGD) WITH PROPOFOL;  Surgeon: Lollie Sails, MD;  Location: [facility] ENDOSCOPY;  Service: Endoscopy;  Laterality: N/A;   ESOPHAGOGASTRODUODENOSCOPY (EGD) WITH PROPOFOL N/A 04/13/2017   Procedure: ESOPHAGOGASTRODUODENOSCOPY (EGD) WITH PROPOFOL;  Surgeon: Lollie Sails, MD;  Location: [facility] ENDOSCOPY;  Service: Endoscopy;  Laterality: N/A;   EYE SURGERY  08/27/2019   cataract extraction right   EYE SURGERY  09/10/2019   cataract extraction left   FLEXIBLE BRONCHOSCOPY N/A 08/27/2017   Procedure: PREOP BRONCHOSCOPY;  Surgeon: Nestor Lewandowsky, MD;  Location: [facility] ORS;  Service: Thoracic;  Laterality: N/A;   GANGLION CYST EXCISION Right    wrist   Incision tendon sheath for trigger finger Left    INTRAMEDULLARY (IM) NAIL INTERTROCHANTERIC Right 12/11/2021   Procedure: INTRAMEDULLARY (IM) NAIL INTERTROCHANTERIC;  Surgeon: Earnestine Leys, MD;  Location: [facility] ORS;  Service: Orthopedics;  Laterality: Right;   LUMBAR LAMINECTOMY/DECOMPRESSION MICRODISCECTOMY Right 11/06/2016   Procedure: RIGHT L5/S1 HEMILAMINECTOMY AND CYST RESECTION L5/S1;  Surgeon: Deetta Perla, MD;   Location: [facility] ORS;  Service: Neurosurgery;  Laterality: Right;   MASTECTOMY  07/2010   partial mastectomy left   MULTIPLE TOOTH EXTRACTIONS N/A    9 teeth   NASAL SEPTUM SURGERY     THORACOTOMY Left 08/27/2017   Procedure: THORACOTOMY MAJOR;  Surgeon: Nestor Lewandowsky, MD;  Location: [facility] ORS;  Service: Thoracic;  Laterality: Left;   TONSILLECTOMY     TRIGGER FINGER RELEASE Left     FAMILY HISTORY :   Family History  Problem Relation Age of Onset   Breast cancer Maternal Aunt 22       great aunt   Prostate cancer Father    Hypertension Father    Heart disease Father    Alzheimer's disease Mother    Heart disease Mother    Hypertension Mother    Colon polyps Mother    Depression Brother        brother #2    SOCIAL HISTORY:   Social History   Tobacco Use   Smoking status: Former    Packs/day: 1.00    Years: 45.00    Total pack years: 45.00  Types: Cigarettes    Quit date: 07/26/2017    Years since quitting: 4.7   Smokeless tobacco: Never  Vaping Use   Vaping Use: Never used  Substance Use Topics   Alcohol use: No    Alcohol/week: 0.0 standard drinks of alcohol   Drug use: No    ALLERGIES:  is allergic to erythromycin, septra ds [sulfamethoxazole-trimethoprim], sulfa antibiotics, sulfasalazine, tetracyclines & related, aspirin, demeclocycline, ivp dye [iodinated contrast media], amoxicillin, and latex.  MEDICATIONS:  Current Outpatient Medications  Medication Sig Dispense Refill   acetaminophen (TYLENOL) 500 MG tablet Take 500-1,000 mg by mouth 2 (two) times daily as needed for moderate pain (FOR HEADACHES/BACK PAIN.).      albuterol (PROVENTIL HFA;VENTOLIN HFA) 108 (90 BASE) MCG/ACT inhaler Inhale 2 puffs into the lungs every 6 (six) hours as needed for wheezing or shortness of breath.      ALPRAZolam (XANAX) 0.25 MG tablet Take 0.25 mg by mouth 3 (three) times daily as needed for anxiety.      atorvastatin (LIPITOR) 10 MG tablet Take 1 tablet (10 mg total) by  mouth daily. 90 tablet 3   Calcium Carbonate-Vitamin D (CALTRATE 600+D PO) Take 1 tablet by mouth daily. Chewable     ezetimibe (ZETIA) 10 MG tablet Take 1 tablet (10 mg total) by mouth daily. 90 tablet 3   hydroxypropyl methylcellulose / hypromellose (ISOPTO TEARS / GONIOVISC) 2.5 % ophthalmic solution Place 1 drop into both eyes as needed for dry eyes.     Lifitegrast 5 % SOLN Place 1 drop into both eyes 2 (two) times daily as needed (for excessively dry eyes.).     Multiple Vitamins-Minerals (PRESERVISION AREDS 2+MULTI VIT PO) Take by mouth in the morning and at bedtime.     pantoprazole (PROTONIX) 40 MG tablet Take 40 mg by mouth daily.     Polyethyl Glyc-Propyl Glyc PF (SYSTANE ULTRA PF) 0.4-0.3 % SOLN Apply 1 drop to eye as needed (both eye for dry eyes).     polyethylene glycol (MIRALAX / GLYCOLAX) 17 g packet Take 17 g by mouth daily as needed for mild constipation. 30 each 0   pregabalin (LYRICA) 25 MG capsule Take 25 mg by mouth 2 (two) times daily.     vitamin B-12 (CYANOCOBALAMIN) 1000 MCG tablet Take 1,000 mcg by mouth daily.     vitamin E 1000 UNIT capsule Take 1,000 Units by mouth daily.     enoxaparin (LOVENOX) 30 MG/0.3ML injection Inject 0.3 mLs (30 mg total) into the skin daily for 14 days. 4.2 mL 0   oxyCODONE (OXY IR/ROXICODONE) 5 MG immediate release tablet Take 1 tablet (5 mg total) by mouth every 6 (six) hours as needed for severe pain. 12 tablet 0   zolpidem (AMBIEN) 10 MG tablet Take 0.5 tablets (5 mg total) by mouth at bedtime as needed for sleep. (Patient not taking: Reported on 05/08/2022) 30 tablet 0   No current facility-administered medications for this visit.    PHYSICAL EXAMINATION: ECOG PERFORMANCE STATUS: 1 - Symptomatic but completely ambulatory  BP 132/83 (BP Location: Right Arm, Patient Position: Sitting)   Pulse 67   Temp (!) 97 F (36.1 C) (Tympanic)   Resp 16   Wt 116 lb 9.6 oz (52.9 kg)   BMI 19.40 kg/m   Filed Weights   05/08/22 1400   Weight: 116 lb 9.6 oz (52.9 kg)     Physical Exam HENT:     Head: Normocephalic and atraumatic.     Mouth/Throat:  Pharynx: No oropharyngeal exudate.  Eyes:     Pupils: Pupils are equal, round, and reactive to light.  Cardiovascular:     Rate and Rhythm: Normal rate and regular rhythm.  Pulmonary:     Effort: No respiratory distress.     Breath sounds: No wheezing.  Abdominal:     General: Bowel sounds are normal. There is no distension.     Palpations: Abdomen is soft. There is no mass.     Tenderness: There is no abdominal tenderness. There is no guarding or rebound.  Musculoskeletal:        General: No tenderness. Normal range of motion.     Cervical back: Normal range of motion and neck supple.  Skin:    General: Skin is warm.     Comments:    Neurological:     Mental Status: She is alert and oriented to person, place, and time.  Psychiatric:        Mood and Affect: Affect normal.        LABORATORY DATA:  I have reviewed the data as listed    Component Value Date/Time   NA 138 05/08/2022 1355   NA 136 02/06/2012 0957   K 3.8 05/08/2022 1355   K 3.8 02/06/2012 0957   CL 102 05/08/2022 1355   CO2 28 05/08/2022 1355   GLUCOSE 98 05/08/2022 1355   BUN 8 05/08/2022 1355   CREATININE 0.67 05/08/2022 1355   CREATININE 0.97 11/12/2013 1508   CALCIUM 8.9 05/08/2022 1355   CALCIUM 9.5 05/08/2012 1018   PROT 7.4 05/08/2022 1355   PROT 7.2 09/24/2020 1159   PROT 7.6 11/12/2013 1508   ALBUMIN 3.9 05/08/2022 1355   ALBUMIN 4.4 09/24/2020 1159   ALBUMIN 3.7 11/12/2013 1508   AST 18 05/08/2022 1355   AST 10 (L) 11/12/2013 1508   ALT 14 05/08/2022 1355   ALT 15 11/12/2013 1508   ALKPHOS 34 (L) 05/08/2022 1355   ALKPHOS 49 11/12/2013 1508   BILITOT 0.8 05/08/2022 1355   BILITOT 0.8 09/24/2020 1159   BILITOT 0.7 11/12/2013 1508   GFRNONAA >60 05/08/2022 1355   GFRNONAA >60 11/12/2013 1508   GFRAA >60 03/06/2018 1302   GFRAA >60 11/12/2013 1508    No  results found for: "SPEP", "UPEP"  Lab Results  Component Value Date   WBC 5.1 05/08/2022   NEUTROABS 2.8 05/08/2022   HGB 13.4 05/08/2022   HCT 38.1 05/08/2022   MCV 91.6 05/08/2022   PLT 220 05/08/2022      Chemistry      Component Value Date/Time   NA 138 05/08/2022 1355   NA 136 02/06/2012 0957   K 3.8 05/08/2022 1355   K 3.8 02/06/2012 0957   CL 102 05/08/2022 1355   CO2 28 05/08/2022 1355   BUN 8 05/08/2022 1355   CREATININE 0.67 05/08/2022 1355   CREATININE 0.97 11/12/2013 1508      Component Value Date/Time   CALCIUM 8.9 05/08/2022 1355   CALCIUM 9.5 05/08/2012 1018   ALKPHOS 34 (L) 05/08/2022 1355   ALKPHOS 49 11/12/2013 1508   AST 18 05/08/2022 1355   AST 10 (L) 11/12/2013 1508   ALT 14 05/08/2022 1355   ALT 15 11/12/2013 1508   BILITOT 0.8 05/08/2022 1355   BILITOT 0.8 09/24/2020 1159   BILITOT 0.7 11/12/2013 1508       RADIOGRAPHIC STUDIES: I have personally reviewed the radiological images as listed and agreed with the findings in the report. No results  found.   ASSESSMENT & PLAN:  Primary cancer of left upper lobe of lung ([place]) #Left upper lobe adenocarcinoma stage I; JAN 2024- CT scan today-No acute intrathoracic pathology. No evidence of recurrent or metastatic disease; Stable postsurgical changes of posterior left upper lobe wedge resection.  Stable small bilateral lower lobe pulmonary nodules. stable.  Will repeat CT scan in 1 year.   # Post thoracotomy pain-- currently OFF tramadol/ OFF neurontin 200 mg in AM; 200 mg in PM; stable.    # History of breast cancer stage I; DEC 2023- Mammo-WNL- stable.   # CT IV contrast dye allergy-discussed with patient the importance of informing CT ordering physician regarding her allergy.  Premedications could be considered.  Allergy list updated.  #Incidental findings on Imaging CT, 2024:Aortic Atherosclerosis;reviewed/discussed/counseled the patient.   Dye allergy- neck swelling-benadryl  #  DISPOSITION: # Bil screening mammogram dec # Follow-up in 12 months-MD; labs- cbc/cmp; non-CT chest -- Dr.B  # I reviewed the blood work- with the patient in detail; also reviewed the imaging independently [as summarized above]; and with the patient in detail.    Orders Placed This Encounter  Procedures   CT CHEST WO CONTRAST    Standing Status:   Future    Standing Expiration Date:   05/09/2023    Order Specific Question:   Preferred imaging location?    Answer:   [facility]    Order Specific Question:   Radiology Contrast Protocol - do NOT remove file path    Answer:   [email or web address]   MM 3D SCREEN BREAST BILATERAL    Standing Status:   Future    Standing Expiration Date:   05/09/2023    Order Specific Question:   Reason for Exam (SYMPTOM  OR DIAGNOSIS REQUIRED)    Answer:   Hx Breast cancer    Order Specific Question:   Preferred imaging location?    Answer:   [facility]   CBC with Differential    Standing Status:   Future    Standing Expiration Date:   05/08/2023   Comprehensive metabolic panel    Standing Status:   Future    Standing Expiration Date:   05/08/2023   CBC with Differential/Platelet    Standing Status:   Future    Standing Expiration Date:   05/09/2023   Comprehensive metabolic panel    Standing Status:   Future    Standing Expiration Date:   05/09/2023   All questions were answered. The patient knows to call the clinic with any problems, questions or concerns.      Cammie Sickle, MD 05/08/2022 3:53 PM

## 2022-05-08 NOTE — Assessment & Plan Note (Addendum)
#  Left upper lobe adenocarcinoma stage I; JAN 2024- CT scan today-No acute intrathoracic pathology. No evidence of recurrent or metastatic disease; Stable postsurgical changes of posterior left upper lobe wedge resection.  Stable small bilateral lower lobe pulmonary nodules. stable.  Will repeat CT scan in 1 year.   # Post thoracotomy pain-- currently OFF tramadol/ OFF neurontin 200 mg in AM; 200 mg in PM; stable.    # History of breast cancer stage I; DEC 2023- Mammo-WNL- stable.   # CT IV contrast dye allergy-discussed with patient the importance of informing CT ordering physician regarding her allergy.  Premedications could be considered.  Allergy list updated.  #Incidental findings on Imaging CT, 2024:Aortic Atherosclerosis;reviewed/discussed/counseled the patient.   Dye allergy- neck swelling-benadryl  # DISPOSITION: # Bil screening mammogram dec # Follow-up in 12 months-MD; labs- cbc/cmp; non-CT chest -- Dr.B  # I reviewed the blood work- with the patient in detail; also reviewed the imaging independently [as summarized above]; and with the patient in detail.

## 2022-08-11 ENCOUNTER — Encounter: Payer: Self-pay | Admitting: Emergency Medicine

## 2022-08-14 ENCOUNTER — Ambulatory Visit: Payer: Medicare Other | Admitting: Anesthesiology

## 2022-08-14 ENCOUNTER — Encounter: Payer: Self-pay | Admitting: *Deleted

## 2022-08-14 ENCOUNTER — Ambulatory Visit
Admission: RE | Admit: 2022-08-14 | Discharge: 2022-08-14 | Disposition: A | Discharge status: Home / Self Care | Payer: Medicare Other | Attending: Gastroenterology | Admitting: Gastroenterology

## 2022-08-14 ENCOUNTER — Other Ambulatory Visit: Payer: Self-pay

## 2022-08-14 ENCOUNTER — Encounter
Admission: RE | Disposition: A | Discharge status: Home / Self Care | Payer: Self-pay | Source: Home / Self Care | Attending: Gastroenterology

## 2022-08-14 DIAGNOSIS — Z1211 Encounter for screening for malignant neoplasm of colon: Secondary | ICD-10-CM | POA: Diagnosis not present

## 2022-08-14 DIAGNOSIS — K640 First degree hemorrhoids: Secondary | ICD-10-CM | POA: Insufficient documentation

## 2022-08-14 DIAGNOSIS — Z853 Personal history of malignant neoplasm of breast: Secondary | ICD-10-CM | POA: Diagnosis not present

## 2022-08-14 DIAGNOSIS — I2510 Atherosclerotic heart disease of native coronary artery without angina pectoris: Secondary | ICD-10-CM | POA: Insufficient documentation

## 2022-08-14 DIAGNOSIS — D122 Benign neoplasm of ascending colon: Secondary | ICD-10-CM | POA: Diagnosis not present

## 2022-08-14 DIAGNOSIS — Z85118 Personal history of other malignant neoplasm of bronchus and lung: Secondary | ICD-10-CM | POA: Insufficient documentation

## 2022-08-14 DIAGNOSIS — K219 Gastro-esophageal reflux disease without esophagitis: Secondary | ICD-10-CM | POA: Insufficient documentation

## 2022-08-14 DIAGNOSIS — G8929 Other chronic pain: Secondary | ICD-10-CM | POA: Insufficient documentation

## 2022-08-14 DIAGNOSIS — K589 Irritable bowel syndrome without diarrhea: Secondary | ICD-10-CM | POA: Diagnosis not present

## 2022-08-14 DIAGNOSIS — Z87891 Personal history of nicotine dependence: Secondary | ICD-10-CM | POA: Diagnosis not present

## 2022-08-14 DIAGNOSIS — J449 Chronic obstructive pulmonary disease, unspecified: Secondary | ICD-10-CM | POA: Diagnosis not present

## 2022-08-14 DIAGNOSIS — F419 Anxiety disorder, unspecified: Secondary | ICD-10-CM | POA: Diagnosis not present

## 2022-08-14 DIAGNOSIS — Z86010 Personal history of colonic polyps: Secondary | ICD-10-CM | POA: Diagnosis present

## 2022-08-14 DIAGNOSIS — I10 Essential (primary) hypertension: Secondary | ICD-10-CM | POA: Diagnosis not present

## 2022-08-14 HISTORY — PX: COLONOSCOPY WITH PROPOFOL: SHX5780

## 2022-08-14 SURGERY — COLONOSCOPY WITH PROPOFOL
Anesthesia: General

## 2022-08-14 MED ORDER — PROPOFOL 500 MG/50ML IV EMUL
INTRAVENOUS | Status: DC | PRN
  Administered 2022-08-14: 160 ug/kg/min via INTRAVENOUS

## 2022-08-14 MED ORDER — PROPOFOL 10 MG/ML IV BOLUS
INTRAVENOUS | Status: DC | PRN
  Administered 2022-08-14: 50 mg via INTRAVENOUS

## 2022-08-14 MED ORDER — SODIUM CHLORIDE 0.9 % IV SOLN: INTRAVENOUS | Status: DC | PRN

## 2022-08-14 MED ORDER — SODIUM CHLORIDE 0.9 % IV SOLN: INTRAVENOUS | Status: DC

## 2022-08-14 MED ORDER — DEXMEDETOMIDINE HCL IN NACL 80 MCG/20ML IV SOLN: INTRAVENOUS | Status: DC | PRN

## 2022-08-14 NOTE — H&P (Signed)
Outpatient short stay form Pre-procedure 08/14/2022  Regis Bill, MD  Primary Physician: Mick Sell, MD  Reason for visit:  Surveillance  History of present illness:    67 y/o lady with history of COPD, chronic pain, and IBS here for colonoscopy for history of adenomatous polyps. Last colonoscopy in 2023 with poor prep. No blood thinners. No family history of GI malignancies. History of cholecystectomy.    Current Facility-Administered Medications:    0.9 %  sodium chloride infusion, , Intravenous, Continuous, Jermany Rimel, Rossie Muskrat, MD  Medications Prior to Admission  Medication Sig Dispense Refill Last Dose   atorvastatin (LIPITOR) 10 MG tablet Take 1 tablet (10 mg total) by mouth daily. 90 tablet 3 08/14/2022   denosumab (PROLIA) 60 MG/ML SOSY injection Inject 60 mg into the skin every 6 (six) months.      levalbuterol (XOPENEX HFA) 45 MCG/ACT inhaler Inhale into the lungs every 4 (four) hours as needed for wheezing.      loteprednol (LOTEMAX) 0.5 % ophthalmic suspension 4 (four) times daily.      acetaminophen (TYLENOL) 500 MG tablet Take 500-1,000 mg by mouth 2 (two) times daily as needed for moderate pain (FOR HEADACHES/BACK PAIN.).       albuterol (PROVENTIL HFA;VENTOLIN HFA) 108 (90 BASE) MCG/ACT inhaler Inhale 2 puffs into the lungs every 6 (six) hours as needed for wheezing or shortness of breath.       ALPRAZolam (XANAX) 0.25 MG tablet Take 0.25 mg by mouth 3 (three) times daily as needed for anxiety.       Calcium Carbonate-Vitamin D (CALTRATE 600+D PO) Take 1 tablet by mouth daily. Chewable      enoxaparin (LOVENOX) 30 MG/0.3ML injection Inject 0.3 mLs (30 mg total) into the skin daily for 14 days. 4.2 mL 0    ezetimibe (ZETIA) 10 MG tablet Take 1 tablet (10 mg total) by mouth daily. 90 tablet 3    hydroxypropyl methylcellulose / hypromellose (ISOPTO TEARS / GONIOVISC) 2.5 % ophthalmic solution Place 1 drop into both eyes as needed for dry eyes.      Lifitegrast  5 % SOLN Place 1 drop into both eyes 2 (two) times daily as needed (for excessively dry eyes.).      Multiple Vitamins-Minerals (PRESERVISION AREDS 2+MULTI VIT PO) Take by mouth in the morning and at bedtime.      oxyCODONE (OXY IR/ROXICODONE) 5 MG immediate release tablet Take 1 tablet (5 mg total) by mouth every 6 (six) hours as needed for severe pain. 12 tablet 0    pantoprazole (PROTONIX) 40 MG tablet Take 40 mg by mouth daily.      Polyethyl Glyc-Propyl Glyc PF (SYSTANE ULTRA PF) 0.4-0.3 % SOLN Apply 1 drop to eye as needed (both eye for dry eyes).      polyethylene glycol (MIRALAX / GLYCOLAX) 17 g packet Take 17 g by mouth daily as needed for mild constipation. 30 each 0    pregabalin (LYRICA) 25 MG capsule Take 25 mg by mouth 2 (two) times daily.      vitamin B-12 (CYANOCOBALAMIN) 1000 MCG tablet Take 1,000 mcg by mouth daily.      vitamin E 1000 UNIT capsule Take 1,000 Units by mouth daily.      zolpidem (AMBIEN) 10 MG tablet Take 0.5 tablets (5 mg total) by mouth at bedtime as needed for sleep. (Patient not taking: Reported on 05/08/2022) 30 tablet 0      Allergies  Allergen Reactions   Erythromycin Anaphylaxis and  Rash   Septra Ds [Sulfamethoxazole-Trimethoprim] Anaphylaxis    Neck swelling as well    Sulfa Antibiotics Anaphylaxis   Sulfasalazine Anaphylaxis   Tetracyclines & Related Hives and Itching   Aspirin Other (See Comments)    Abdominal Pain    Demeclocycline    Ivp Dye [Iodinated Contrast Media]     Neck swelling- after IV contrast- 6-8 hours post.    Amoxicillin Rash   Latex Rash    Plastic bandaids/ gloves     Past Medical History:  Diagnosis Date   Breast cancer ([facility]) 2012   left breast   COPD (chronic obstructive pulmonary disease) ([facility])    Deafness in left ear    Depression    Diverticulosis    Ductal carcinoma of left breast ([facility]) 2012   Family history of adverse reaction to anesthesia    mother got sick   Gastritis    GERD (gastroesophageal reflux  disease)    H. pylori infection    H/O herpes labialis    Hemorrhoids    History of chicken pox    Hyperplastic colon polyp    Hypertension    Lung cancer ([facility]) 08/2017   surgery only, no rad or chemo tx   Multinodular goiter (nontoxic)    Osteoporosis    Panic disorder    Personal history of radiation therapy    Recurrent sinusitis    Recurrent sinusitis    Tobacco use    Tubular adenoma of colon     Review of systems:  Otherwise negative.    Physical Exam  Gen: Alert, oriented. Appears stated age.  HEENT: PERRLA. Lungs: No respiratory distress CV: RRR Abd: soft, benign, no masses Ext: No edema    Planned procedures: Proceed with colonoscopy. The patient understands the nature of the planned procedure, indications, risks, alternatives and potential complications including but not limited to bleeding, infection, perforation, damage to internal organs and possible oversedation/side effects from anesthesia. The patient agrees and gives consent to proceed.  Please refer to procedure notes for findings, recommendations and patient disposition/instructions.     Regis Bill, MD Minimally Invasive Surgery [facility]

## 2022-08-14 NOTE — Interval H&P Note (Signed)
History and Physical Interval Note:  08/14/2022 9:02 AM  Katie Franklin  has presented today for surgery, with the diagnosis of HX OF ADENOMATOUD POLYP OF COLON.  The various methods of treatment have been discussed with the patient and family. After consideration of risks, benefits and other options for treatment, the patient has consented to  Procedure(s): COLONOSCOPY WITH PROPOFOL (N/A) as a surgical intervention.  The patient's history has been reviewed, patient examined, no change in status, stable for surgery.  I have reviewed the patient's chart and labs.  Questions were answered to the patient's satisfaction.     Regis Bill  Ok to proceed with colonoscopy

## 2022-08-14 NOTE — Transfer of Care (Signed)
Immediate Anesthesia Transfer of Care Note  Patient: Katie Franklin  Procedure(s) Performed: COLONOSCOPY WITH PROPOFOL  Patient Location: PACU  Anesthesia Type:General  Level of Consciousness: drowsy  Airway & Oxygen Therapy: Patient Spontanous Breathing  Post-op Assessment: Report given to RN and Post -op Vital signs reviewed and stable  Post vital signs: Reviewed and stable  Last Vitals:  Vitals Value Taken Time  BP 115/72 08/14/22 0933  Temp    Pulse 70 08/14/22 0933  Resp 22 08/14/22 0933  SpO2 98 % 08/14/22 0933  Vitals shown include unvalidated device data.  Last Pain:  Vitals:   08/14/22 0932  TempSrc:   PainSc: 0-No pain         Complications: No notable events documented.

## 2022-08-14 NOTE — Anesthesia Postprocedure Evaluation (Signed)
Anesthesia Post Note  Patient: Katie Franklin  Procedure(s) Performed: COLONOSCOPY WITH PROPOFOL  Patient location during evaluation: PACU Anesthesia Type: General Level of consciousness: awake and alert, oriented and patient cooperative Pain management: pain level controlled Vital Signs Assessment: post-procedure vital signs reviewed and stable Respiratory status: spontaneous breathing, nonlabored ventilation and respiratory function stable Cardiovascular status: blood pressure returned to baseline and stable Postop Assessment: adequate PO intake Anesthetic complications: no   No notable events documented.   Last Vitals:  Vitals:   08/14/22 0948 08/14/22 1002  BP: (!) 132/90 (!) 134/92  Pulse:    Resp:    Temp:    SpO2:      Last Pain:  Vitals:   08/14/22 0941  TempSrc: Temporal  PainSc: 0-No pain                 Reed Breech

## 2022-08-14 NOTE — Op Note (Addendum)
[facility] Gastroenterology Patient Name: Katie Franklin Procedure Date: 08/14/2022 9:02 AM MRN: [id number] Account #: [id number] Date of Birth: 08/17/1955 Admit Type: Outpatient Age: 67 Room: [facility] ENDO ROOM 3 Gender: Female Note Status: Finalized Instrument Name: Prentice Docker [id number] Procedure:             Colonoscopy Indications:           Surveillance: History of adenomatous polyps,                         inadequate prep on last exam (<20yr) Providers:             Eather Colas MD, MD Referring MD:          Clydie Braun (Referring MD) Medicines:             Monitored Anesthesia Care Complications:         No immediate complications. Estimated blood loss:                         Minimal. Procedure:             Pre-Anesthesia Assessment:                        - Prior to the procedure, a History and Physical was                         performed, and patient medications and allergies were                         reviewed. The patient is competent. The risks and                         benefits of the procedure and the sedation options and                         risks were discussed with the patient. All questions                         were answered and informed consent was obtained.                         Patient identification and proposed procedure were                         verified by the physician, the nurse, the                         anesthesiologist, the anesthetist and the technician                         in the endoscopy suite. Mental Status Examination:                         alert and oriented. Airway Examination: normal                         oropharyngeal airway and neck mobility. Respiratory  Examination: clear to auscultation. CV Examination:                         normal. Prophylactic Antibiotics: The patient does not                         require prophylactic antibiotics. Prior                          Anticoagulants: The patient has taken no anticoagulant                         or antiplatelet agents. ASA Grade Assessment: III - A                         patient with severe systemic disease. After reviewing                         the risks and benefits, the patient was deemed in                         satisfactory condition to undergo the procedure. The                         anesthesia plan was to use monitored anesthesia care                         (MAC). Immediately prior to administration of                         medications, the patient was re-assessed for adequacy                         to receive sedatives. The heart rate, respiratory                         rate, oxygen saturations, blood pressure, adequacy of                         pulmonary ventilation, and response to care were                         monitored throughout the procedure. The physical                         status of the patient was re-assessed after the                         procedure.                        After obtaining informed consent, the colonoscope was                         passed under direct vision. Throughout the procedure,                         the patient's blood pressure, pulse, and oxygen  saturations were monitored continuously. The                         Colonoscope was introduced through the anus and                         advanced to the the cecum, identified by appendiceal                         orifice and ileocecal valve. The colonoscopy was                         somewhat difficult due to a redundant colon. The                         patient tolerated the procedure well. The quality of                         the bowel preparation was adequate to identify polyps.                         The ileocecal valve, appendiceal orifice, and rectum                         were photographed. Findings:      The perianal and digital rectal examinations  were normal.      A 5 mm polyp was found in the ascending colon. The polyp was sessile.       The polyp was removed with a cold snare. Resection and retrieval were       complete. Estimated blood loss was minimal.      Internal hemorrhoids were found during retroflexion. The hemorrhoids       were Grade I (internal hemorrhoids that do not prolapse).      The exam was otherwise without abnormality on direct and retroflexion       views. Impression:            - One 5 mm polyp in the ascending colon, removed with                         a cold snare. Resected and retrieved.                        - Internal hemorrhoids.                        - The examination was otherwise normal on direct and                         retroflexion views. Recommendation:        - Discharge patient to home.                        - Resume previous diet.                        - Continue present medications.                        - Await pathology results.                        -  Repeat colonoscopy in 7 years for surveillance.                        - Return to referring physician as previously                         scheduled. Procedure Code(s):     --- Professional ---                        [phone], Colonoscopy, flexible; with removal of                         tumor(s), polyp(s), or other lesion(s) by snare                         technique Diagnosis Code(s):     --- Professional ---                        Z86.010, Personal history of colonic polyps                        D12.2, Benign neoplasm of ascending colon                        K64.0, First degree hemorrhoids CPT copyright 2022 American Medical Association. All rights reserved. The codes documented in this report are preliminary and upon coder review may  be revised to meet current compliance requirements. Eather Colas MD, MD 08/14/2022 9:35:35 AM Number of Addenda: 0 Note Initiated On: 08/14/2022 9:02 AM Scope Withdrawal Time: 0 hours 9  minutes 56 seconds  Total Procedure Duration: 0 hours 18 minutes 37 seconds  Estimated Blood Loss:  Estimated blood loss was minimal.      [facility]

## 2022-08-14 NOTE — Anesthesia Procedure Notes (Signed)
Date/Time: 08/14/2022 9:09 AM  Performed by: Malva Cogan, CRNAPre-anesthesia Checklist: Patient identified, Emergency Drugs available, Suction available, Patient being monitored and Timeout performed Patient Re-evaluated:Patient Re-evaluated prior to induction Oxygen Delivery Method: Nasal cannula Induction Type: IV induction Placement Confirmation: CO2 detector and positive ETCO2

## 2022-08-14 NOTE — Anesthesia Preprocedure Evaluation (Addendum)
Anesthesia Evaluation  Patient identified by MRN, date of birth, ID band Patient awake    Reviewed: Allergy & Precautions, NPO status , Patient's Chart, lab work & pertinent test results  History of Anesthesia Complications Negative for: history of anesthetic complications  Airway Mallampati: I   Neck ROM: Full    Dental  (+) Missing, Chipped   Pulmonary COPD, Patient abstained from smoking., former smoker (quit 2019) Lung CA s/p resection   Pulmonary exam normal breath sounds clear to auscultation       Cardiovascular hypertension, + CAD  Normal cardiovascular exam Rhythm:Regular Rate:Normal  ECG 12/11/21: NSR; nonspecific ST abnormality; no STEMI   Neuro/Psych  PSYCHIATRIC DISORDERS Anxiety Depression    HOH; cervical stenosis    GI/Hepatic ,GERD  ,,  Endo/Other  negative endocrine ROS    Renal/GU negative Renal ROS     Musculoskeletal   Abdominal   Peds  Hematology Breast CA   Anesthesia Other Findings Cardiology note 10/21/21:  Aortic atherosclerosis (HCC) Recommend she consider starting a atorvastatin 10 mg daily Continue Zetia Goal LDL less than 70 Continued smoking cessation   Centrilobular emphysema (HCC) Reports that she quit smoking Previously reported chronic mild shortness of breath on exertion History of lobectomy   Coronary artery disease involving native coronary artery of native heart without angina pectoris -  Currently with no symptoms of angina. No further workup at this time. Continue current medication regimen.   Smoker Reports that she quit smoking over a year ago   Essential hypertension - Plan: EKG 12-Lead Blood pressure is well controlled on today's visit. No changes made to the medications.   History of breast cancer Prior radiation on the left  Stressed importance of aggressive lipid control,  She has quit smoking   Leg pain Lower extremity arterial Dopplers were normal  June 2022   Reproductive/Obstetrics                             Anesthesia Physical Anesthesia Plan  ASA: 3  Anesthesia Plan: General   Post-op Pain Management:    Induction: Intravenous  PONV Risk Score and Plan: 3 and Propofol infusion, TIVA and Treatment may vary due to age or medical condition  Airway Management Planned: Natural Airway  Additional Equipment:   Intra-op Plan:   Post-operative Plan:   Informed Consent: I have reviewed the patients History and Physical, chart, labs and discussed the procedure including the risks, benefits and alternatives for the proposed anesthesia with the patient or authorized representative who has indicated his/her understanding and acceptance.       Plan Discussed with: CRNA  Anesthesia Plan Comments: (LMA/GETA backup discussed.  Patient consented for risks of anesthesia including but not limited to:  - adverse reactions to medications - damage to eyes, teeth, lips or other oral mucosa - nerve damage due to positioning  - sore throat or hoarseness - damage to heart, brain, nerves, lungs, other parts of body or loss of life  Informed patient about role of CRNA in peri- and intra-operative care.  Patient voiced understanding.)        Anesthesia Quick Evaluation

## 2022-08-15 ENCOUNTER — Encounter: Payer: Self-pay | Admitting: Gastroenterology

## 2022-08-15 LAB — SURGICAL PATHOLOGY

## 2022-10-03 ENCOUNTER — Other Ambulatory Visit: Payer: Self-pay | Admitting: Cardiovascular Disease

## 2022-10-03 NOTE — Telephone Encounter (Signed)
Hi,  Could you schedule this patient a 12 month follow up visit? The patient was last seen by Dr. Mariah Milling on 10-21-21. Thank you so much.

## 2022-10-05 NOTE — Telephone Encounter (Signed)
Pt scheduled on 7/15

## 2022-10-29 NOTE — Progress Notes (Signed)
Cardiology Office Note  Date:  10/30/2022   ID:  Katie Franklin, DOB February 15, 1956, MRN [id number]  PCP:  Mick Sell, MD   Chief Complaint  Patient presents with   12 month follow up     "Doing well." Medications reviewed by the patient verbally.     HPI:  Katie Franklin is a 67 y.o. female  With past medical history of Active smoker COPD: Moderate centrilobular and mild paraseptal emphysema on CT scan left-sided breast cancer treated with lumpectomy and radiation therapy.   Laparoscopic Cholecystectomy.  Last CT scan 08/2019: Significant coronary calcification noted, aortic atherosclerosis Who presents for follow-up of CAD on CT scan/Pet scan  LOV with myself 7/23 Takes care of animals Outside animals, some indoor  Aug 2023: fell out of bed, fractured hip, has recovered Reports atenolol and spironolactone 25 were held at that time  Still smoking a little bit, 1-2 cigs a day Chronic leg pain, worse at night when she is sleeping in bed Lower extremity arterial Dopplers 2022 with no significant stenosis  Has not been checking blood pressure at home  Follows with oncology for lung nodules, has CT scan pending  On zetia, lipitor Total chol 158, down 50 points  EKG personally reviewed by myself on todays visit EKG Interpretation Date/Time:  Monday October 30 2022 14:02:26 EDT Ventricular Rate:  63 PR Interval:  124 QRS Duration:  80 QT Interval:  408 QTC Calculation: 417 R Axis:   65  Text Interpretation: Normal sinus rhythm Nonspecific ST and T wave abnormality When compared with ECG of 11-Dec-2021 05:19, Nonspecific T wave abnormality now evident in Lateral leads Confirmed by Julien Nordmann [phone]) on 10/30/2022 2:23:06 PM    Previously had rash on Crestor Tried Livalo but this was too expensive  Last CT scan 08/2019 Significant coronary calcification noted, aortic atherosclerosis  Other past medical history reviewed pulmonary function studies  FEV1 of  approximately 70% and a DLCO of approximately 80%.     smoking since she was 67 years of age   smoked upwards of 2 packs a day for some years of her life   PMH:   has a past medical history of Breast cancer (HCC) (2012), COPD (chronic obstructive pulmonary disease) (HCC), Deafness in left ear, Depression, Diverticulosis, Ductal carcinoma of left breast (HCC) (2012), Family history of adverse reaction to anesthesia, Gastritis, GERD (gastroesophageal reflux disease), H. pylori infection, H/O herpes labialis, Hemorrhoids, History of chicken pox, Hyperplastic colon polyp, Hypertension, Lung cancer (HCC) (08/2017), Multinodular goiter (nontoxic), Osteoporosis, Panic disorder, Personal history of radiation therapy, Recurrent sinusitis, Recurrent sinusitis, Tobacco use, and Tubular adenoma of colon.  PSH:    Past Surgical History:  Procedure Laterality Date   BACK SURGERY  2018   lumbar   BREAST EXCISIONAL BIOPSY Left    negative 1980's twice   BREAST EXCISIONAL BIOPSY Left 2012   [facility]   BREAST LUMPECTOMY Left 2012   [facility], clear margins, LN neg   BUNIONECTOMY Right    CHOLECYSTECTOMY N/A 06/11/2017   Procedure: LAPAROSCOPIC CHOLECYSTECTOMY;  Surgeon: Carolan Shiver, MD;  Location: [facility] ORS;  Service: General;  Laterality: N/A;   COLONOSCOPY N/A 05/17/2021   Procedure: COLONOSCOPY;  Surgeon: Regis Bill, MD;  Location: [facility] ENDOSCOPY;  Service: Endoscopy;  Laterality: N/A;   COLONOSCOPY WITH PROPOFOL N/A 12/28/2014   Procedure: COLONOSCOPY WITH PROPOFOL;  Surgeon: Christena Deem, MD;  Location: [facility] ENDOSCOPY;  Service: Endoscopy;  Laterality: N/A;   COLONOSCOPY WITH PROPOFOL N/A 08/17/2017  Procedure: COLONOSCOPY WITH PROPOFOL;  Surgeon: Christena Deem, MD;  Location: [facility] ENDOSCOPY;  Service: Endoscopy;  Laterality: N/A;   COLONOSCOPY WITH PROPOFOL N/A 08/14/2022   Procedure: COLONOSCOPY WITH PROPOFOL;  Surgeon: Regis Bill, MD;  Location: [facility] ENDOSCOPY;  Service:  Endoscopy;  Laterality: N/A;   cyst throat     base of tongue   ESOPHAGOGASTRODUODENOSCOPY     ESOPHAGOGASTRODUODENOSCOPY N/A 04/13/2020   Procedure: ESOPHAGOGASTRODUODENOSCOPY (EGD);  Surgeon: Regis Bill, MD;  Location[facility] ENDOSCOPY;  Service: Endoscopy;  Laterality: N/A;   ESOPHAGOGASTRODUODENOSCOPY (EGD) WITH PROPOFOL N/A 12/02/2015   Procedure: ESOPHAGOGASTRODUODENOSCOPY (EGD) WITH PROPOFOL;  Surgeon: Christena Deem, MD;  Location: [facility] ENDOSCOPY;  Service: Endoscopy;  Laterality: N/A;   ESOPHAGOGASTRODUODENOSCOPY (EGD) WITH PROPOFOL N/A 04/13/2017   Procedure: ESOPHAGOGASTRODUODENOSCOPY (EGD) WITH PROPOFOL;  Surgeon: Christena Deem, MD;  Location: [facility] ENDOSCOPY;  Service: Endoscopy;  Laterality: N/A;   EYE SURGERY  08/27/2019   cataract extraction right   EYE SURGERY  09/10/2019   cataract extraction left   FLEXIBLE BRONCHOSCOPY N/A 08/27/2017   Procedure: PREOP BRONCHOSCOPY;  Surgeon: Hulda Marin, MD;  Location: [facility] ORS;  Service: Thoracic;  Laterality: N/A;   GANGLION CYST EXCISION Right    wrist   Incision tendon sheath for trigger finger Left    INTRAMEDULLARY (IM) NAIL INTERTROCHANTERIC Right 12/11/2021   Procedure: INTRAMEDULLARY (IM) NAIL INTERTROCHANTERIC;  Surgeon: Deeann Saint, MD;  Location: [facility] ORS;  Service: Orthopedics;  Laterality: Right;   LUMBAR LAMINECTOMY/DECOMPRESSION MICRODISCECTOMY Right 11/06/2016   Procedure: RIGHT L5/S1 HEMILAMINECTOMY AND CYST RESECTION L5/S1;  Surgeon: Lucy Chris, MD;  Location: [facility] ORS;  Service: Neurosurgery;  Laterality: Right;   MASTECTOMY  07/2010   partial mastectomy left   MULTIPLE TOOTH EXTRACTIONS N/A    9 teeth   NASAL SEPTUM SURGERY     THORACOTOMY Left 08/27/2017   Procedure: THORACOTOMY MAJOR;  Surgeon: Hulda Marin, MD;  Location: [facility] ORS;  Service: Thoracic;  Laterality: Left;   TONSILLECTOMY     TRIGGER FINGER RELEASE Left     Current Outpatient Medications  Medication Sig Dispense Refill    acetaminophen (TYLENOL) 500 MG tablet Take 500-1,000 mg by mouth 2 (two) times daily as needed for moderate pain (FOR HEADACHES/BACK PAIN.).      albuterol (PROVENTIL HFA;VENTOLIN HFA) 108 (90 BASE) MCG/ACT inhaler Inhale 2 puffs into the lungs every 6 (six) hours as needed for wheezing or shortness of breath.      ALPRAZolam (XANAX) 0.25 MG tablet Take 0.25 mg by mouth 3 (three) times daily as needed for anxiety.      atorvastatin (LIPITOR) 10 MG tablet Take 1 tablet (10 mg total) by mouth daily. PLEASE CALL OFFICE TO SCHEDULE APPOINTMENT PRIOR TO NEXT REFILL (1ST ATTEMPT) 30 tablet 0   Calcium Carbonate-Vitamin D (CALTRATE 600+D PO) Take 1 tablet by mouth daily. Chewable     denosumab (PROLIA) 60 MG/ML SOSY injection Inject 60 mg into the skin every 6 (six) months.     ezetimibe (ZETIA) 10 MG tablet Take 1 tablet (10 mg total) by mouth daily. PLEASE CALL OFFICE TO SCHEDULE APPOINTMENT PRIOR TO NEXT REFILL (1ST ATTEMPT) 30 tablet 0   hydroxypropyl methylcellulose / hypromellose (ISOPTO TEARS / GONIOVISC) 2.5 % ophthalmic solution Place 1 drop into both eyes as needed for dry eyes.     levalbuterol (XOPENEX HFA) 45 MCG/ACT inhaler Inhale into the lungs every 4 (four) hours as needed for wheezing.     loteprednol (LOTEMAX) 0.5 % ophthalmic suspension 4 (  four) times daily.     Multiple Vitamins-Minerals (PRESERVISION AREDS 2+MULTI VIT PO) Take by mouth in the morning and at bedtime.     pantoprazole (PROTONIX) 40 MG tablet Take 40 mg by mouth daily.     Polyethyl Glyc-Propyl Glyc PF (SYSTANE ULTRA PF) 0.4-0.3 % SOLN Apply 1 drop to eye as needed (both eye for dry eyes).     polyethylene glycol (MIRALAX / GLYCOLAX) 17 g packet Take 17 g by mouth daily as needed for mild constipation. 30 each 0   pregabalin (LYRICA) 25 MG capsule Take 25 mg by mouth 2 (two) times daily.     vitamin B-12 (CYANOCOBALAMIN) 1000 MCG tablet Take 1,000 mcg by mouth daily.     vitamin E 1000 UNIT capsule Take 1,000 Units by  mouth daily.     Lifitegrast 5 % SOLN Place 1 drop into both eyes 2 (two) times daily as needed (for excessively dry eyes.). (Patient not taking: Reported on 10/30/2022)     oxyCODONE (OXY IR/ROXICODONE) 5 MG immediate release tablet Take 1 tablet (5 mg total) by mouth every 6 (six) hours as needed for severe pain. (Patient not taking: Reported on 10/30/2022) 12 tablet 0   zolpidem (AMBIEN) 10 MG tablet Take 0.5 tablets (5 mg total) by mouth at bedtime as needed for sleep. (Patient not taking: Reported on 05/08/2022) 30 tablet 0   No current facility-administered medications for this visit.     Allergies:   Erythromycin, Septra ds [sulfamethoxazole-trimethoprim], Sulfa antibiotics, Sulfasalazine, Tetracyclines & related, Aspirin, Demeclocycline, Ivp dye [iodinated contrast media], Amoxicillin, and Latex   Social History:  The patient  reports that she has been smoking cigarettes. She started smoking about 50 years ago. She has a 45 pack-year smoking history. She has never used smokeless tobacco. She reports that she does not drink alcohol and does not use drugs.   Family History:   family history includes Alzheimer's disease in her mother; Breast cancer (age of onset: 64) in her maternal aunt; Colon polyps in her mother; Depression in her brother; Heart disease in her father and mother; Hypertension in her father and mother; Prostate cancer in her father.    Review of Systems: Review of Systems  Constitutional: Negative.   HENT: Negative.    Respiratory: Negative.    Cardiovascular: Negative.   Gastrointestinal: Negative.   Neurological: Negative.   Psychiatric/Behavioral: Negative.    All other systems reviewed and are negative.   PHYSICAL EXAM: VS:  BP (!) 132/90 (BP Location: Right Arm, Patient Position: Sitting, Cuff Size: Normal)   Pulse 63   Ht 5\' 5"  (1.651 m)   Wt 113 lb 6 oz (51.4 kg)   SpO2 98%   BMI 18.87 kg/m  , BMI Body mass index is 18.87 kg/m. Constitutional:  oriented  to person, place, and time. No distress.  HENT:  Head: Grossly normal Eyes:  no discharge. No scleral icterus.  Neck: No JVD, no carotid bruits  Cardiovascular: Regular rate and rhythm, no murmurs appreciated Pulmonary/Chest: Clear to auscultation bilaterally, no wheezes or rails Abdominal: Soft.  no distension.  no tenderness.  Musculoskeletal: Normal range of motion Neurological:  normal muscle tone. Coordination normal. No atrophy Skin: Skin warm and dry Psychiatric: normal affect, pleasant   Recent Labs: 05/08/2022: ALT 14; BUN 8; Creatinine, Ser 0.67; Hemoglobin 13.4; Platelets 220; Potassium 3.8; Sodium 138    Lipid Panel Lab Results  Component Value Date   CHOL 185 09/24/2020   HDL 74 09/24/2020  LDLCALC 97 09/24/2020   TRIG 79 09/24/2020      Wt Readings from Last 3 Encounters:  10/30/22 113 lb 6 oz (51.4 kg)  08/14/22 116 lb (52.6 kg)  05/08/22 116 lb 9.6 oz (52.9 kg)     ASSESSMENT AND PLAN:  Aortic atherosclerosis (HCC) Continue Lipitor 10 with Zetia 10 Total cholesterol down 50 points, more in line with goal Numbers  Centrilobular emphysema (HCC) Reports still smoking 1 to 2 cigarettes/day, cessation recommended  Coronary artery disease involving native coronary artery of native heart without angina pectoris -  Currently with no symptoms of angina. No further workup at this time. Continue current medication regimen.  Smoker Quit for some time, started back again only small number per day Cessation recommended  Essential hypertension - Plan: EKG 12-Lead Blood pressure running up without her atenolol and spironolactone Reports she used to be on spironolactone for fluid in her ear, recommend she restart spironolactone 25 daily Will hold off on atenolol given low baseline heart rate  History of breast cancer Prior radiation on the left   Leg pain Lower extremity arterial Dopplers were normal June 2022 Recommend she remain hydrated, light  stretching    Total encounter time more than 30 minutes  Greater than 50% was spent in counseling and coordination of care with the patient    Orders Placed This Encounter  Procedures   EKG 12-Lead     Signed, Dossie Arbour, M.D., Ph.D. 10/30/2022  [facility] [place], [place] [phone]

## 2022-10-30 ENCOUNTER — Ambulatory Visit: Payer: Medicare Other | Attending: Cardiovascular Disease | Admitting: Cardiovascular Disease

## 2022-10-30 ENCOUNTER — Encounter: Payer: Self-pay | Admitting: Cardiovascular Disease

## 2022-10-30 VITALS — BP 132/90 | HR 63 | Ht 65.0 in | Wt 113.4 lb

## 2022-10-30 DIAGNOSIS — I10 Essential (primary) hypertension: Secondary | ICD-10-CM | POA: Diagnosis not present

## 2022-10-30 DIAGNOSIS — I25118 Atherosclerotic heart disease of native coronary artery with other forms of angina pectoris: Secondary | ICD-10-CM | POA: Diagnosis not present

## 2022-10-30 DIAGNOSIS — J432 Centrilobular emphysema: Secondary | ICD-10-CM | POA: Diagnosis not present

## 2022-10-30 DIAGNOSIS — E782 Mixed hyperlipidemia: Secondary | ICD-10-CM

## 2022-10-30 DIAGNOSIS — I700 Atherosclerosis of aorta: Secondary | ICD-10-CM

## 2022-10-30 DIAGNOSIS — R002 Palpitations: Secondary | ICD-10-CM

## 2022-10-30 DIAGNOSIS — I739 Peripheral vascular disease, unspecified: Secondary | ICD-10-CM

## 2022-10-30 DIAGNOSIS — Z87891 Personal history of nicotine dependence: Secondary | ICD-10-CM

## 2022-10-30 MED ORDER — SPIRONOLACTONE 25 MG PO TABS: 25.0000 mg | ORAL_TABLET | Freq: Every day | ORAL | 3 refills | Status: DC

## 2022-10-30 MED ORDER — ATORVASTATIN CALCIUM 10 MG PO TABS: 10.0000 mg | ORAL_TABLET | Freq: Every day | ORAL | 3 refills | Status: DC

## 2022-10-30 MED ORDER — EZETIMIBE 10 MG PO TABS: 10.0000 mg | ORAL_TABLET | Freq: Every day | ORAL | 3 refills | Status: DC

## 2022-10-30 NOTE — Addendum Note (Signed)
Addended by: Jani Gravel on: 10/30/2022 02:30 PM   Modules accepted: Orders

## 2022-10-30 NOTE — Patient Instructions (Addendum)
Medication Instructions:  Please restart spironolactone 25 mg daily  If you need a refill on your cardiac medications before your next appointment, please call your pharmacy.   Lab work: No new labs needed  Testing/Procedures: No new testing needed  Follow-Up: At [facility], you and your health needs are our priority.  As part of our continuing mission to provide you with exceptional heart care, we have created designated Provider Care Teams.  These Care Teams include your primary Cardiologist (physician) and Advanced Practice Providers (APPs -  Physician Assistants and Nurse Practitioners) who all work together to provide you with the care you need, when you need it.  You will need a follow up appointment in 12 months  Providers on your designated Care Team:   Nicolasa Ducking, NP Eula Listen, PA-C Cadence Fransico Michael, [place]  COVID-19 Vaccine Information can be found at: [email or web address] For questions related to vaccine distribution or appointments, please email [email or web address] or call [phone].

## 2022-12-12 ENCOUNTER — Other Ambulatory Visit: Payer: Self-pay | Admitting: Physical Medicine and Rehabilitation

## 2022-12-12 DIAGNOSIS — M5416 Radiculopathy, lumbar region: Secondary | ICD-10-CM

## 2022-12-30 ENCOUNTER — Ambulatory Visit
Admission: RE | Admit: 2022-12-30 | Discharge: 2022-12-30 | Disposition: A | Discharge status: Home / Self Care | Payer: Medicare Other | Source: Ambulatory Visit | Attending: Physical Medicine and Rehabilitation | Admitting: Physical Medicine and Rehabilitation

## 2022-12-30 DIAGNOSIS — M5416 Radiculopathy, lumbar region: Secondary | ICD-10-CM

## 2023-02-16 ENCOUNTER — Other Ambulatory Visit: Payer: Self-pay | Admitting: Infectious Diseases

## 2023-02-16 DIAGNOSIS — C50912 Malignant neoplasm of unspecified site of left female breast: Secondary | ICD-10-CM

## 2023-02-16 DIAGNOSIS — R1032 Left lower quadrant pain: Secondary | ICD-10-CM

## 2023-02-20 ENCOUNTER — Other Ambulatory Visit: Payer: Medicare Other

## 2023-02-20 ENCOUNTER — Ambulatory Visit
Admission: RE | Admit: 2023-02-20 | Discharge: 2023-02-20 | Disposition: A | Discharge status: Home / Self Care | Payer: Medicare Other | Source: Ambulatory Visit | Attending: Infectious Diseases | Admitting: Infectious Diseases

## 2023-02-20 DIAGNOSIS — C50912 Malignant neoplasm of unspecified site of left female breast: Secondary | ICD-10-CM | POA: Insufficient documentation

## 2023-02-20 DIAGNOSIS — R1032 Left lower quadrant pain: Secondary | ICD-10-CM | POA: Insufficient documentation

## 2023-02-20 MED ORDER — IOHEXOL 300 MG/ML  SOLN
100.0000 mL | Freq: Once | INTRAMUSCULAR | Status: AC | PRN
  Administered 2023-02-20: 100 mL via INTRAVENOUS

## 2023-04-17 ENCOUNTER — Other Ambulatory Visit: Payer: Self-pay | Admitting: Internal Medicine

## 2023-04-17 ENCOUNTER — Ambulatory Visit
Admission: RE | Admit: 2023-04-17 | Discharge: 2023-04-17 | Disposition: A | Discharge status: Home / Self Care | Payer: Medicare Other | Source: Ambulatory Visit | Attending: Internal Medicine | Admitting: Internal Medicine

## 2023-04-17 DIAGNOSIS — R2232 Localized swelling, mass and lump, left upper limb: Secondary | ICD-10-CM

## 2023-04-17 DIAGNOSIS — C50812 Malignant neoplasm of overlapping sites of left female breast: Secondary | ICD-10-CM | POA: Insufficient documentation

## 2023-04-17 DIAGNOSIS — Z170 Estrogen receptor positive status [ER+]: Secondary | ICD-10-CM | POA: Insufficient documentation

## 2023-05-09 ENCOUNTER — Ambulatory Visit
Admission: RE | Admit: 2023-05-09 | Discharge: 2023-05-09 | Disposition: A | Discharge status: Home / Self Care | Payer: Medicare Other | Source: Ambulatory Visit | Attending: Internal Medicine | Admitting: Internal Medicine

## 2023-05-09 DIAGNOSIS — C3412 Malignant neoplasm of upper lobe, left bronchus or lung: Secondary | ICD-10-CM | POA: Insufficient documentation

## 2023-05-14 ENCOUNTER — Inpatient Hospital Stay (HOSPITAL_BASED_OUTPATIENT_CLINIC_OR_DEPARTMENT_OTHER): Payer: Medicare Other | Admitting: Internal Medicine

## 2023-05-14 ENCOUNTER — Inpatient Hospital Stay: Payer: Medicare Other | Attending: Internal Medicine

## 2023-05-14 ENCOUNTER — Encounter: Payer: Self-pay | Admitting: Internal Medicine

## 2023-05-14 ENCOUNTER — Other Ambulatory Visit: Payer: Self-pay

## 2023-05-14 VITALS — BP 128/83 | HR 78 | Temp 98.6°F | Resp 19 | Wt 102.6 lb

## 2023-05-14 DIAGNOSIS — Z85118 Personal history of other malignant neoplasm of bronchus and lung: Secondary | ICD-10-CM | POA: Insufficient documentation

## 2023-05-14 DIAGNOSIS — Z08 Encounter for follow-up examination after completed treatment for malignant neoplasm: Secondary | ICD-10-CM | POA: Insufficient documentation

## 2023-05-14 DIAGNOSIS — Z853 Personal history of malignant neoplasm of breast: Secondary | ICD-10-CM | POA: Diagnosis present

## 2023-05-14 DIAGNOSIS — C3412 Malignant neoplasm of upper lobe, left bronchus or lung: Secondary | ICD-10-CM

## 2023-05-14 DIAGNOSIS — M810 Age-related osteoporosis without current pathological fracture: Secondary | ICD-10-CM | POA: Insufficient documentation

## 2023-05-14 DIAGNOSIS — C50919 Malignant neoplasm of unspecified site of unspecified female breast: Secondary | ICD-10-CM | POA: Diagnosis not present

## 2023-05-14 LAB — CBC WITH DIFFERENTIAL (CANCER CENTER ONLY)
Abs Immature Granulocytes: 0.02 10*3/uL (ref 0.00–0.07)
Basophils Absolute: 0 10*3/uL (ref 0.0–0.1)
Basophils Relative: 1 %
Eosinophils Absolute: 0.2 10*3/uL (ref 0.0–0.5)
Eosinophils Relative: 2 %
HCT: 44.6 % (ref 36.0–46.0)
Hemoglobin: 15 g/dL (ref 12.0–15.0)
Immature Granulocytes: 0 %
Lymphocytes Relative: 24 %
Lymphs Abs: 1.7 10*3/uL (ref 0.7–4.0)
MCH: 32 pg (ref 26.0–34.0)
MCHC: 33.6 g/dL (ref 30.0–36.0)
MCV: 95.1 fL (ref 80.0–100.0)
Monocytes Absolute: 0.7 10*3/uL (ref 0.1–1.0)
Monocytes Relative: 11 %
Neutro Abs: 4.3 10*3/uL (ref 1.7–7.7)
Neutrophils Relative %: 62 %
Platelet Count: 255 10*3/uL (ref 150–400)
RBC: 4.69 MIL/uL (ref 3.87–5.11)
RDW: 12.3 % (ref 11.5–15.5)
WBC Count: 7 10*3/uL (ref 4.0–10.5)
nRBC: 0 % (ref 0.0–0.2)

## 2023-05-14 LAB — CMP (CANCER CENTER ONLY)
ALT: 17 U/L (ref 0–44)
AST: 19 U/L (ref 15–41)
Albumin: 4.3 g/dL (ref 3.5–5.0)
Alkaline Phosphatase: 40 U/L (ref 38–126)
Anion gap: 9 (ref 5–15)
BUN: 12 mg/dL (ref 8–23)
CO2: 26 mmol/L (ref 22–32)
Calcium: 9.2 mg/dL (ref 8.9–10.3)
Chloride: 101 mmol/L (ref 98–111)
Creatinine: 0.82 mg/dL (ref 0.44–1.00)
GFR, Estimated: >60 mL/min (ref >60)
Glucose, Bld: 91 mg/dL (ref 70–99)
Potassium: 3.8 mmol/L (ref 3.5–5.1)
Sodium: 136 mmol/L (ref 135–145)
Total Bilirubin: 1.1 mg/dL (ref 0.0–1.2)
Total Protein: 7.5 g/dL (ref 6.5–8.1)

## 2023-05-14 NOTE — Progress Notes (Signed)
[facility] OFFICE PROGRESS NOTE  Patient Care Team: Mick Sell, MD as PCP - General (Infectious Diseases) Earna Coder, MD as Consulting Physician (Oncology)   Cancer Staging  No matching staging information was found for the patient.    Oncology History Overview Note  # 2012- LEFT BREAST [pt1b- STAGE I; G-2];ER/PR-Pos; her 2 Neu-NEG;  RS- 21 [risk of recurrence 13%];s/p Lumpec& SLNBx; NO chemo; s/p RT; On Aromasin  [finish April 2017]  # MAY 2019- ADENO CA LUL STAGE IA [s/p wedge resection; Dr.Oaks; [facility]]  # Osteoporosis- stopped PO bisphosphonate sec to GI issues; On reclast every 12 months; defer to PCP.  Chronic back pain-Dr.Chasnis   DIAGNOSIS: [ ]  Breast ca Stage I ER/PR pos; Her 2NEG; LUL adeno stage I  GOALS: curative  CURRENT/MOST RECENT THERAPY- surveillance    Carcinoma of overlapping sites of left breast in female, estrogen receptor positive (HCC)  Primary cancer of left upper lobe of lung (HCC)    INTERVAL HISTORY: alone. Ambulating independently.   Katie Franklin 68 y.o.  female pleasant patient above history of breast cancer; and also left upper lobe lung cancer is here for a follow-up/ review the results of CT scan.   Lost 18 pounds in last many month. CT abdomen- negative. Awaiting EGD this week.   Patient denies any difficulty breathing or coughing.  Patient continues to complain of left chest wall-since her lung surgery.  She continues to been gabapentin.  This is not any worse.  Review of Systems  Constitutional:  Negative for chills, diaphoresis, fever, malaise/fatigue and weight loss.  HENT:  Negative for nosebleeds and sore throat.   Eyes:  Negative for double vision.  Respiratory:  Negative for cough, hemoptysis, sputum production, shortness of breath and wheezing.   Cardiovascular:  Positive for chest pain. Negative for palpitations, orthopnea and leg swelling.  Gastrointestinal:  Negative for abdominal pain, blood  in stool, constipation, diarrhea, heartburn, melena, nausea and vomiting.  Genitourinary:  Negative for dysuria, frequency and urgency.  Musculoskeletal:  Negative for back pain and joint pain.  Skin: Negative.  Negative for itching and rash.  Neurological:  Negative for dizziness, tingling, focal weakness, weakness and headaches.  Endo/Heme/Allergies:  Does not bruise/bleed easily.  Psychiatric/Behavioral:  Negative for depression. The patient is not nervous/anxious and does not have insomnia.       PAST MEDICAL HISTORY :  Past Medical History:  Diagnosis Date   Breast cancer (HCC) 2012   left breast   COPD (chronic obstructive pulmonary disease) (HCC)    Deafness in left ear    Depression    Diverticulosis    Ductal carcinoma of left breast (HCC) 2012   Family history of adverse reaction to anesthesia    mother got sick   Gastritis    GERD (gastroesophageal reflux disease)    H. pylori infection    H/O herpes labialis    Hemorrhoids    History of chicken pox    Hyperplastic colon polyp    Hypertension    Lung cancer (HCC) 08/2017   surgery only, no rad or chemo tx   Multinodular goiter (nontoxic)    Osteoporosis    Panic disorder    Personal history of radiation therapy    Recurrent sinusitis    Recurrent sinusitis    Tobacco use    Tubular adenoma of colon     PAST SURGICAL HISTORY :   Past Surgical History:  Procedure Laterality Date   BACK  SURGERY  2018   lumbar   BREAST EXCISIONAL BIOPSY Left    negative 1980's twice   BREAST EXCISIONAL BIOPSY Left 2012   [facility]   BREAST LUMPECTOMY Left 2012   [facility], clear margins, LN neg   BUNIONECTOMY Right    CHOLECYSTECTOMY N/A 06/11/2017   Procedure: LAPAROSCOPIC CHOLECYSTECTOMY;  Surgeon: Carolan Shiver, MD;  Location: [facility] ORS;  Service: General;  Laterality: N/A;   COLONOSCOPY N/A 05/17/2021   Procedure: COLONOSCOPY;  Surgeon: Regis Bill, MD;  Location: [facility] ENDOSCOPY;  Service: Endoscopy;  Laterality:  N/A;   COLONOSCOPY WITH PROPOFOL N/A 12/28/2014   Procedure: COLONOSCOPY WITH PROPOFOL;  Surgeon: Christena Deem, MD;  Location: [facility] ENDOSCOPY;  Service: Endoscopy;  Laterality: N/A;   COLONOSCOPY WITH PROPOFOL N/A 08/17/2017   Procedure: COLONOSCOPY WITH PROPOFOL;  Surgeon: Christena Deem, MD;  Location: [facility] ENDOSCOPY;  Service: Endoscopy;  Laterality: N/A;   COLONOSCOPY WITH PROPOFOL N/A 08/14/2022   Procedure: COLONOSCOPY WITH PROPOFOL;  Surgeon: Regis Bill, MD;  Location: [facility] ENDOSCOPY;  Service: Endoscopy;  Laterality: N/A;   cyst throat     base of tongue   ESOPHAGOGASTRODUODENOSCOPY     ESOPHAGOGASTRODUODENOSCOPY N/A 04/13/2020   Procedure: ESOPHAGOGASTRODUODENOSCOPY (EGD);  Surgeon: Regis Bill, MD;  Location: [facility] ENDOSCOPY;  Service: Endoscopy;  Laterality: N/A;   ESOPHAGOGASTRODUODENOSCOPY (EGD) WITH PROPOFOL N/A 12/02/2015   Procedure: ESOPHAGOGASTRODUODENOSCOPY (EGD) WITH PROPOFOL;  Surgeon: Christena Deem, MD;  Location: [facility] ENDOSCOPY;  Service: Endoscopy;  Laterality: N/A;   ESOPHAGOGASTRODUODENOSCOPY (EGD) WITH PROPOFOL N/A 04/13/2017   Procedure: ESOPHAGOGASTRODUODENOSCOPY (EGD) WITH PROPOFOL;  Surgeon: Christena Deem, MD;  Location: [facility] ENDOSCOPY;  Service: Endoscopy;  Laterality: N/A;   EYE SURGERY  08/27/2019   cataract extraction right   EYE SURGERY  09/10/2019   cataract extraction left   FLEXIBLE BRONCHOSCOPY N/A 08/27/2017   Procedure: PREOP BRONCHOSCOPY;  Surgeon: Hulda Marin, MD;  Location: [facility] ORS;  Service: Thoracic;  Laterality: N/A;   GANGLION CYST EXCISION Right    wrist   Incision tendon sheath for trigger finger Left    INTRAMEDULLARY (IM) NAIL INTERTROCHANTERIC Right 12/11/2021   Procedure: INTRAMEDULLARY (IM) NAIL INTERTROCHANTERIC;  Surgeon: Deeann Saint, MD;  Location: [facility] ORS;  Service: Orthopedics;  Laterality: Right;   LUMBAR LAMINECTOMY/DECOMPRESSION MICRODISCECTOMY Right 11/06/2016   Procedure: RIGHT L5/S1  HEMILAMINECTOMY AND CYST RESECTION L5/S1;  Surgeon: Lucy Chris, MD;  Location: [facility] ORS;  Service: Neurosurgery;  Laterality: Right;   MASTECTOMY  07/2010   partial mastectomy left   MULTIPLE TOOTH EXTRACTIONS N/A    9 teeth   NASAL SEPTUM SURGERY     THORACOTOMY Left 08/27/2017   Procedure: THORACOTOMY MAJOR;  Surgeon: Hulda Marin, MD;  Location: [facility] ORS;  Service: Thoracic;  Laterality: Left;   TONSILLECTOMY     TRIGGER FINGER RELEASE Left     FAMILY HISTORY :   Family History  Problem Relation Age of Onset   Breast cancer Maternal Aunt 54       great aunt   Prostate cancer Father    Hypertension Father    Heart disease Father    Alzheimer's disease Mother    Heart disease Mother    Hypertension Mother    Colon polyps Mother    Depression Brother        brother #2    SOCIAL HISTORY:   Social History   Tobacco Use   Smoking status: Some Days    Current packs/day: 0.00    Average packs/day: 1 pack/day  for 45.0 years (45.0 ttl pk-yrs)    Types: Cigarettes    Start date: 07/26/1972    Last attempt to quit: 07/26/2017    Years since quitting: 5.8   Smokeless tobacco: Never  Vaping Use   Vaping status: Never Used  Substance Use Topics   Alcohol use: No    Alcohol/week: 0.0 standard drinks of alcohol   Drug use: No    ALLERGIES:  is allergic to erythromycin, septra ds [sulfamethoxazole-trimethoprim], sulfa antibiotics, sulfasalazine, tetracyclines & related, aspirin, demeclocycline, ivp dye [iodinated contrast media], amoxicillin, and latex.  MEDICATIONS:  Current Outpatient Medications  Medication Sig Dispense Refill   acetaminophen (TYLENOL) 500 MG tablet Take 500-1,000 mg by mouth 2 (two) times daily as needed for moderate pain (FOR HEADACHES/BACK PAIN.).      albuterol (PROVENTIL HFA;VENTOLIN HFA) 108 (90 BASE) MCG/ACT inhaler Inhale 2 puffs into the lungs every 6 (six) hours as needed for wheezing or shortness of breath.      ALPRAZolam (XANAX) 0.25 MG tablet  Take 0.25 mg by mouth 3 (three) times daily as needed for anxiety.      atorvastatin (LIPITOR) 10 MG tablet Take 1 tablet (10 mg total) by mouth daily. 90 tablet 3   Calcium Carbonate-Vitamin D (CALTRATE 600+D PO) Take 1 tablet by mouth daily. Chewable     ciprofloxacin (CIPRO) 500 MG tablet Take 500 mg by mouth 2 (two) times daily.     denosumab (PROLIA) 60 MG/ML SOSY injection Inject 60 mg into the skin every 6 (six) months.     ezetimibe (ZETIA) 10 MG tablet Take 1 tablet (10 mg total) by mouth daily. 90 tablet 3   levalbuterol (XOPENEX HFA) 45 MCG/ACT inhaler Inhale into the lungs every 4 (four) hours as needed for wheezing.     methimazole (TAPAZOLE) 5 MG tablet Take 2.5 mg by mouth daily.     mirtazapine (REMERON) 15 MG tablet Take 1 tablet by mouth at bedtime.     Multiple Vitamins-Minerals (PRESERVISION AREDS 2+MULTI VIT PO) Take by mouth in the morning and at bedtime.     pantoprazole (PROTONIX) 40 MG tablet Take 40 mg by mouth daily.     Polyethyl Glyc-Propyl Glyc PF (SYSTANE ULTRA PF) 0.4-0.3 % SOLN Apply 1 drop to eye as needed (both eye for dry eyes).     polyethylene glycol (MIRALAX / GLYCOLAX) 17 g packet Take 17 g by mouth daily as needed for mild constipation. 30 each 0   pregabalin (LYRICA) 25 MG capsule Take 25 mg by mouth 2 (two) times daily.     vitamin B-12 (CYANOCOBALAMIN) 1000 MCG tablet Take 1,000 mcg by mouth daily.     vitamin E 1000 UNIT capsule Take 1,000 Units by mouth daily.     hydroxypropyl methylcellulose / hypromellose (ISOPTO TEARS / GONIOVISC) 2.5 % ophthalmic solution Place 1 drop into both eyes as needed for dry eyes. (Patient not taking: Reported on 05/14/2023)     Lifitegrast 5 % SOLN Place 1 drop into both eyes 2 (two) times daily as needed (for excessively dry eyes.). (Patient not taking: Reported on 10/30/2022)     loteprednol (LOTEMAX) 0.5 % ophthalmic suspension 4 (four) times daily. (Patient not taking: Reported on 05/14/2023)     oxyCODONE (OXY  IR/ROXICODONE) 5 MG immediate release tablet Take 1 tablet (5 mg total) by mouth every 6 (six) hours as needed for severe pain. (Patient not taking: Reported on 10/30/2022) 12 tablet 0   spironolactone (ALDACTONE) 25 MG tablet Take 1  tablet (25 mg total) by mouth daily. 90 tablet 3   zolpidem (AMBIEN) 10 MG tablet Take 0.5 tablets (5 mg total) by mouth at bedtime as needed for sleep. (Patient not taking: Reported on 05/08/2022) 30 tablet 0   No current facility-administered medications for this visit.    PHYSICAL EXAMINATION: ECOG PERFORMANCE STATUS: 1 - Symptomatic but completely ambulatory  BP 128/83   Pulse 78   Temp 98.6 F (37 C)   Resp 19   Wt 102 lb 9.6 oz (46.5 kg)   SpO2 98%   BMI 17.07 kg/m   Filed Weights   05/14/23 1425  Weight: 102 lb 9.6 oz (46.5 kg)     Physical Exam HENT:     Head: Normocephalic and atraumatic.     Mouth/Throat:     Pharynx: No oropharyngeal exudate.  Eyes:     Pupils: Pupils are equal, round, and reactive to light.  Cardiovascular:     Rate and Rhythm: Normal rate and regular rhythm.  Pulmonary:     Effort: No respiratory distress.     Breath sounds: No wheezing.  Abdominal:     General: Bowel sounds are normal. There is no distension.     Palpations: Abdomen is soft. There is no mass.     Tenderness: There is no abdominal tenderness. There is no guarding or rebound.  Musculoskeletal:        General: No tenderness. Normal range of motion.     Cervical back: Normal range of motion and neck supple.  Skin:    General: Skin is warm.     Comments:    Neurological:     Mental Status: She is alert and oriented to person, place, and time.  Psychiatric:        Mood and Affect: Affect normal.        LABORATORY DATA:  I have reviewed the data as listed    Component Value Date/Time   NA 136 05/14/2023 1415   NA 136 02/06/2012 0957   K 3.8 05/14/2023 1415   K 3.8 02/06/2012 0957   CL 101 05/14/2023 1415   CO2 26 05/14/2023 1415    GLUCOSE 91 05/14/2023 1415   BUN 12 05/14/2023 1415   CREATININE 0.82 05/14/2023 1415   CREATININE 0.97 11/12/2013 1508   CALCIUM 9.2 05/14/2023 1415   CALCIUM 9.5 05/08/2012 1018   PROT 7.5 05/14/2023 1415   PROT 7.2 09/24/2020 1159   PROT 7.6 11/12/2013 1508   ALBUMIN 4.3 05/14/2023 1415   ALBUMIN 4.4 09/24/2020 1159   ALBUMIN 3.7 11/12/2013 1508   AST 19 05/14/2023 1415   ALT 17 05/14/2023 1415   ALT 15 11/12/2013 1508   ALKPHOS 40 05/14/2023 1415   ALKPHOS 49 11/12/2013 1508   BILITOT 1.1 05/14/2023 1415   GFRNONAA >60 05/14/2023 1415   GFRNONAA >60 11/12/2013 1508   GFRAA >60 03/06/2018 1302   GFRAA >60 11/12/2013 1508    No results found for: "SPEP", "UPEP"  Lab Results  Component Value Date   WBC 7.0 05/14/2023   NEUTROABS 4.3 05/14/2023   HGB 15.0 05/14/2023   HCT 44.6 05/14/2023   MCV 95.1 05/14/2023   PLT 255 05/14/2023      Chemistry      Component Value Date/Time   NA 136 05/14/2023 1415   NA 136 02/06/2012 0957   K 3.8 05/14/2023 1415   K 3.8 02/06/2012 0957   CL 101 05/14/2023 1415   CO2 26 05/14/2023 1415  BUN 12 05/14/2023 1415   CREATININE 0.82 05/14/2023 1415   CREATININE 0.97 11/12/2013 1508      Component Value Date/Time   CALCIUM 9.2 05/14/2023 1415   CALCIUM 9.5 05/08/2012 1018   ALKPHOS 40 05/14/2023 1415   ALKPHOS 49 11/12/2013 1508   AST 19 05/14/2023 1415   ALT 17 05/14/2023 1415   ALT 15 11/12/2013 1508   BILITOT 1.1 05/14/2023 1415       RADIOGRAPHIC STUDIES: I have personally reviewed the radiological images as listed and agreed with the findings in the report. No results found.   ASSESSMENT & PLAN:  Primary cancer of left upper lobe of lung (HCC) #Left upper lobe adenocarcinoma stage I;JAN 22nd, 2025- . Status post left upper lobe wedge resection, without recurrent or metastatic disease; Bilateral subpleural pulmonary nodules are unchanged and considered benign. Since more than 5 years out-.will order CXR.   # Post  thoracotomy pain-- currently OFF tramadol/ OFF neurontin 200 mg in AM; 200 mg in PM; stable.    # SIBO [[facility]]- awaiting GI endo this week.   # History of breast cancer stage I; DEC 2024- Mammo-WNL- stable.dense breasts-  pt will check with insurance re: Breast MRI; and will let us know.   #Incidental findings on Imaging CT, 2024:Aortic Atherosclerosis;reviewed/discussed/counseled the patient.   Dye allergy- neck swelling-benadryl  # DISPOSITION: # Bil screening mammogram dec # Follow-up in 12 months-MD; labs- cbc/cmp; CXR prior-- Dr.B  # I reviewed the blood work- with the patient in detail; also reviewed the imaging independently [as summarized above]; and with the patient in detail.    Orders Placed This Encounter  Procedures   DG Chest 2 View    Standing Status:   Future    Expected Date:   05/13/2024    Expiration Date:   05/13/2024    Reason for Exam (SYMPTOM  OR DIAGNOSIS REQUIRED):   hx of lung cancer    Preferred imaging location?:   [facility]   MM 3D SCREENING MAMMOGRAM BILATERAL BREAST    Standing Status:   Future    Expected Date:   03/24/2024    Expiration Date:   05/13/2024    Reason for Exam (SYMPTOM  OR DIAGNOSIS REQUIRED):   hx breast Cancer    Preferred imaging location?:   [facility]   CBC with Differential (Cancer Center Only)    Standing Status:   Future    Expected Date:   05/12/2024    Expiration Date:   05/13/2024   CMP (Cancer Center only)    Standing Status:   Future    Expected Date:   05/12/2024    Expiration Date:   05/13/2024   All questions were answered. The patient knows to call the clinic with any problems, questions or concerns.      Earna Coder, MD 05/14/2023 3:23 PM

## 2023-05-14 NOTE — Assessment & Plan Note (Addendum)
#  Left upper lobe adenocarcinoma stage I;JAN 22nd, 2025- . Status post left upper lobe wedge resection, without recurrent or metastatic disease; Bilateral subpleural pulmonary nodules are unchanged and considered benign. Since more than 5 years out-.will order CXR.   # Post thoracotomy pain-- currently OFF tramadol/ OFF neurontin 200 mg in AM; 200 mg in PM; stable.    # SIBO [[facility]]- awaiting GI endo this week.   # History of breast cancer stage I; DEC 2024- Mammo-WNL- stable.dense breasts-  pt will check with insurance re: Breast MRI; and will let us know.   #Incidental findings on Imaging CT, 2024:Aortic Atherosclerosis;reviewed/discussed/counseled the patient.   Dye allergy- neck swelling-benadryl  # DISPOSITION: # Bil screening mammogram dec # Follow-up in 12 months-MD; labs- cbc/cmp; CXR prior-- Dr.B  # I reviewed the blood work- with the patient in detail; also reviewed the imaging independently [as summarized above]; and with the patient in detail.

## 2023-05-16 ENCOUNTER — Ambulatory Visit: Payer: Medicare Other

## 2023-05-16 DIAGNOSIS — K2950 Unspecified chronic gastritis without bleeding: Secondary | ICD-10-CM

## 2023-05-16 DIAGNOSIS — K449 Diaphragmatic hernia without obstruction or gangrene: Secondary | ICD-10-CM

## 2023-05-16 DIAGNOSIS — K317 Polyp of stomach and duodenum: Secondary | ICD-10-CM

## 2023-08-23 IMAGING — CT CT ABD-PELV W/ CM
2 of 5 series · 16 of 46 positions shown, 18 images · IV contrast (APPLIED)
Comparison: CT chest 10/29/2020; NM pet 08/01/2017; U/S abdomen
05/10/2017; CT abdomen and pelvis 07/15/2014.

CLINICAL DATA: Patient complains of left lower quadrant pain for
the past 2-3 months. Patient reports she does have nausea and
constipation. History of breast and lung cancer. History of
gallbladder removal.

EXAM:
CT ABDOMEN AND PELVIS WITH CONTRAST
TECHNIQUE: Multidetector CT imaging of the abdomen and pelvis was performed
using the standard protocol following bolus administration of
intravenous contrast.
CONTRAST:  75mL OMNIPAQUE IOHEXOL 300 MG/ML  SOLN

[Series 2: axial st · axial · 0.70mm/px · z∈[-838,-488]mm · 13 of 80 slices shown, 15 images]
[im 5/80  soft-tissue]
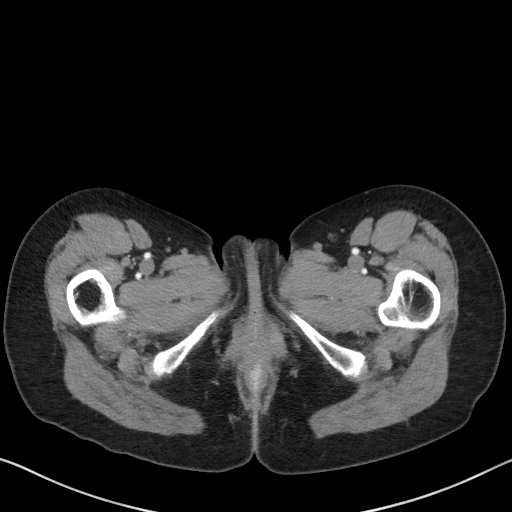
[im 5/80  bone]
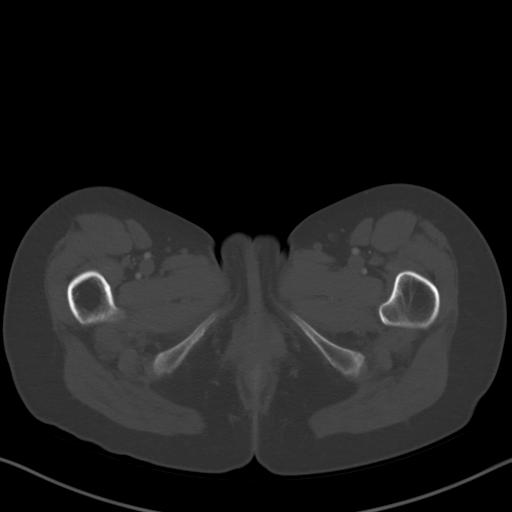
[im 9/80  soft-tissue]
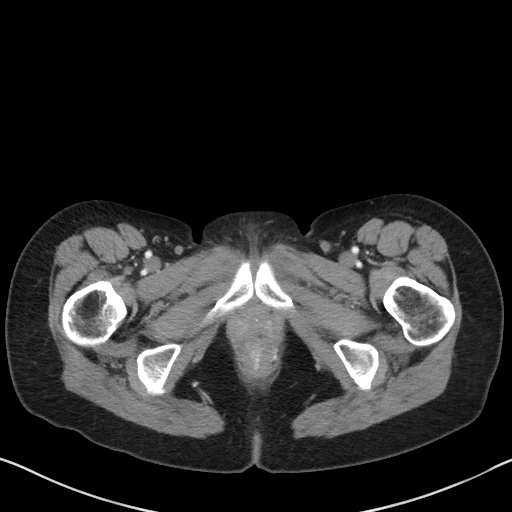
[im 18/80  soft-tissue]
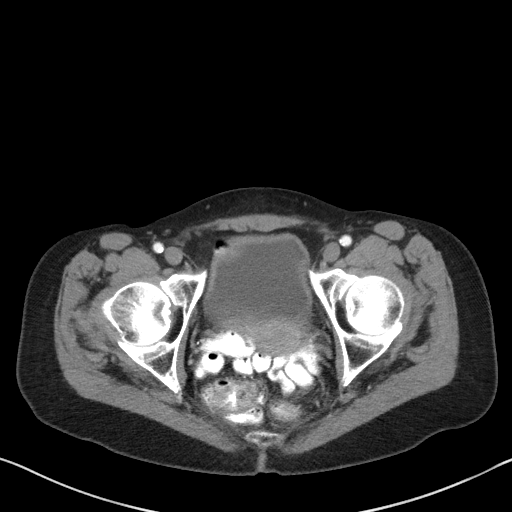
[im 22/80  soft-tissue]
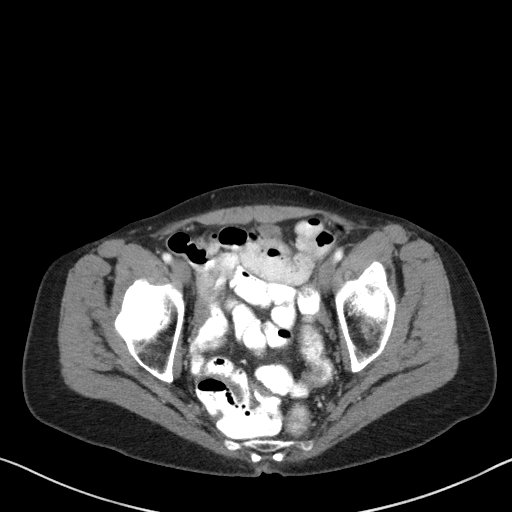
[im 27/80  soft-tissue]
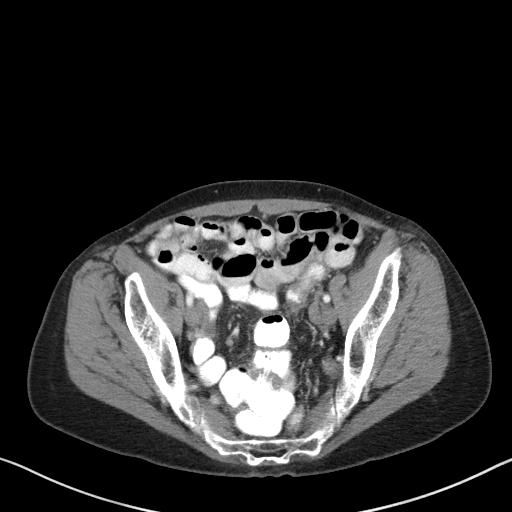
[im 36/80  soft-tissue]
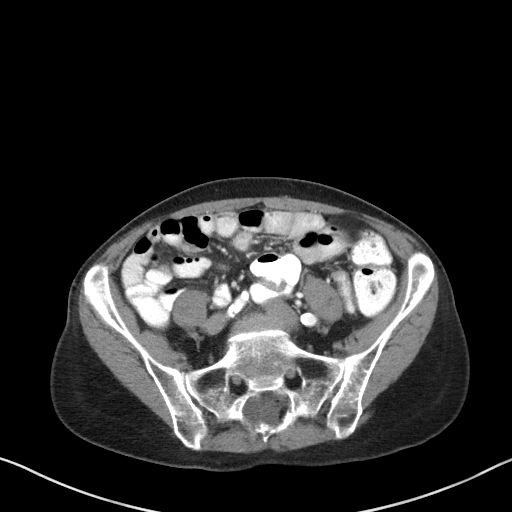
[im 40/80  soft-tissue]
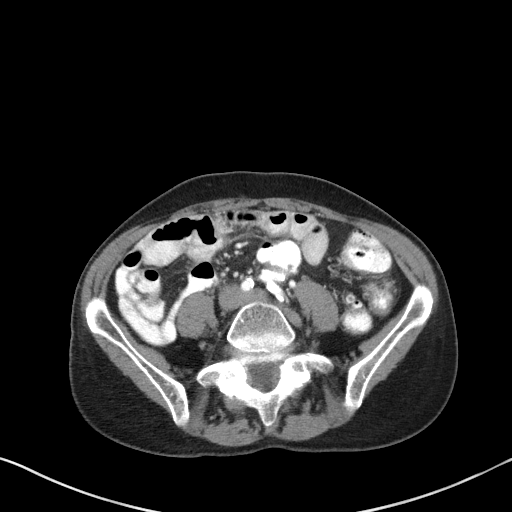
[im 44/80  soft-tissue]
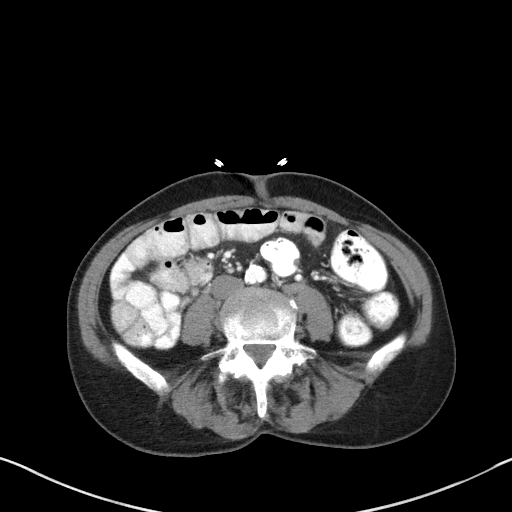
[im 53/80  soft-tissue]
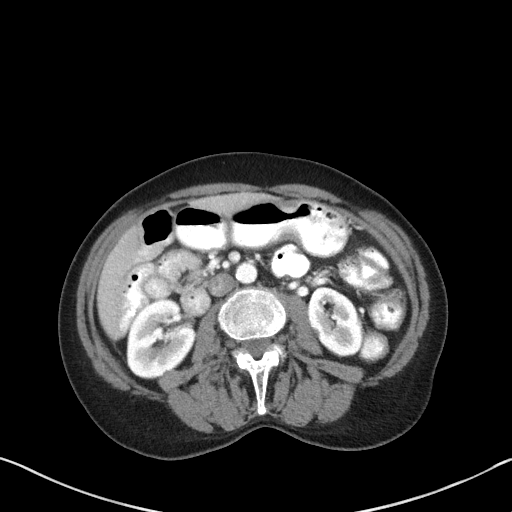
[im 53/80  bone]
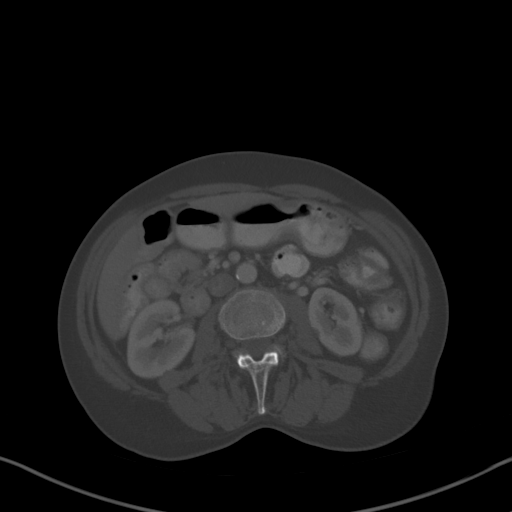
[im 58/80  soft-tissue]
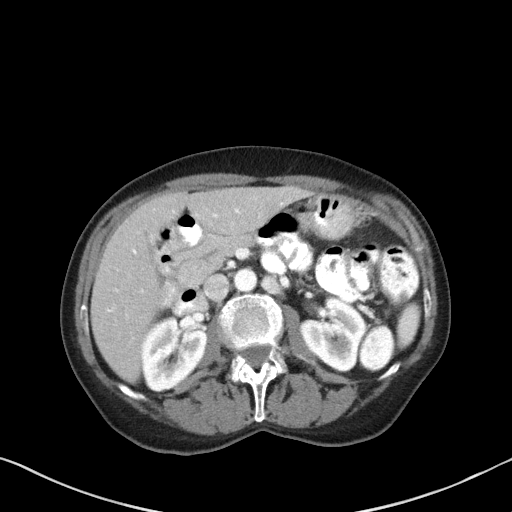
[im 62/80  soft-tissue]
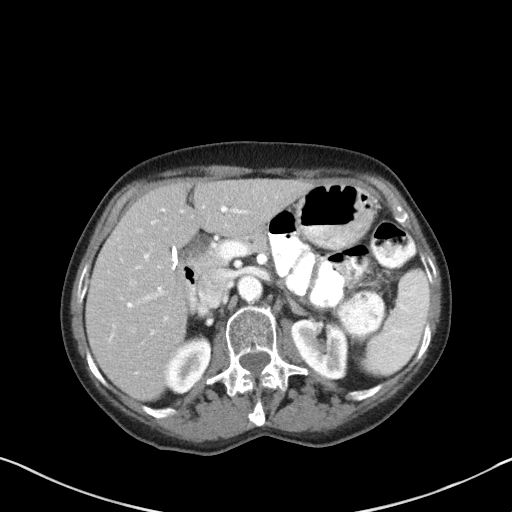
[im 71/80  soft-tissue]
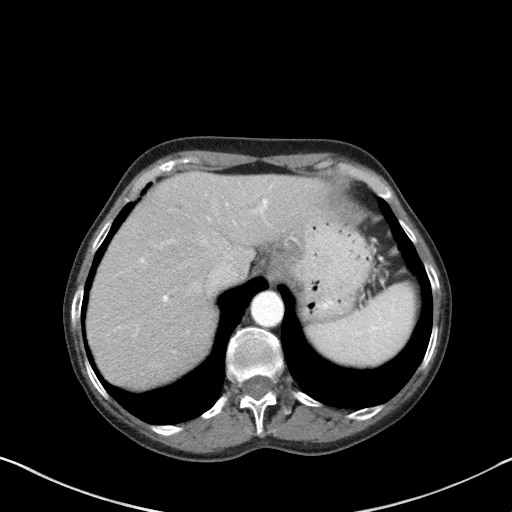
[im 75/80  soft-tissue]
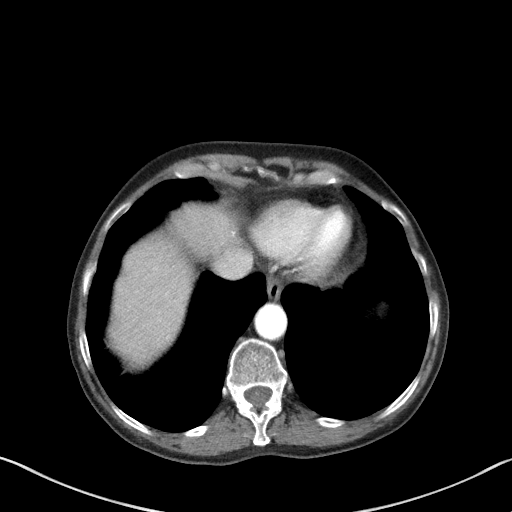

[Series 5: coronal st · coronal · 0.68mm/px · 3 of 101 slices shown]
[im 34/101  soft-tissue]
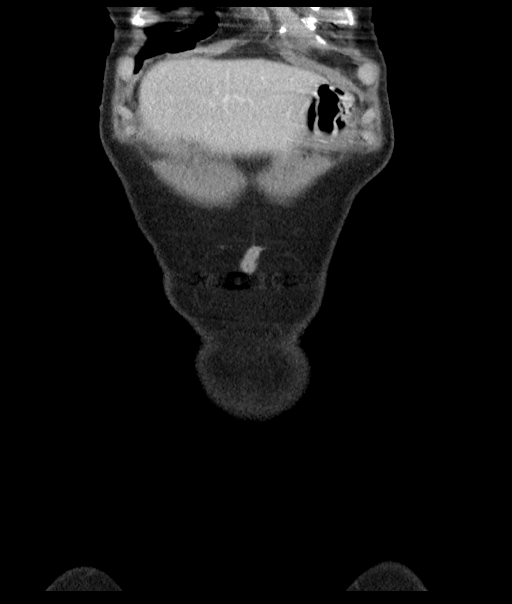
[im 45/101  soft-tissue]
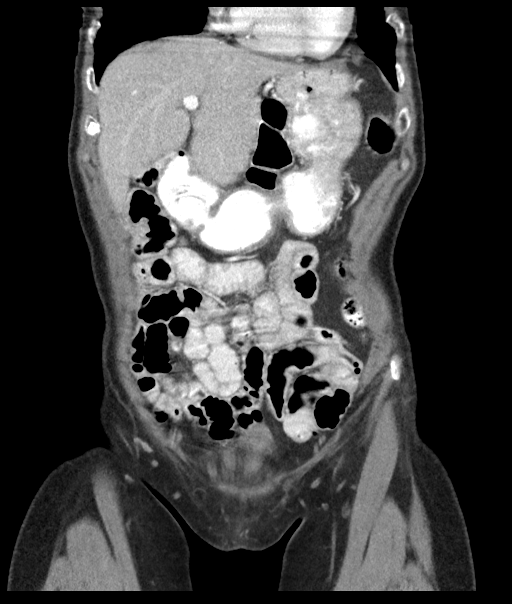
[im 56/101  soft-tissue]
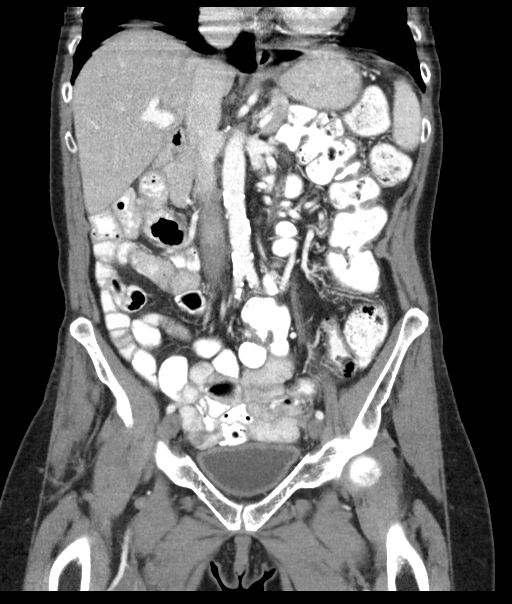

[16 of 46 positions shown; findings below may reference images not displayed]

FINDINGS: Lower chest: Centrilobular emphsyema noted.

Hepatobiliary: 5 mm hypodensity in the lateral segment left liver,
present since 1616 consistent with benign etiology. Gallbladder
surgically absent. Stable mild prominence of the common bile duct
since 1616 at 8 mm diameter in the head of pancreas.

Pancreas: No focal mass lesion. No dilatation of the main duct. No
intraparenchymal cyst. No peripancreatic edema.

Spleen: No splenomegaly. No focal mass lesion.

Adrenals/Urinary Tract: No adrenal nodule or mass. Right kidney
unremarkable. 9 mm low-density lesion lower interpolar right kidney
present since 1616 and compatible with benign etiology such as a
cyst. No evidence for hydroureter. The urinary bladder appears
normal for the degree of distention.

Stomach/Bowel: Stomach is decompressed which likely accentuates
apparent wall thickening. Duodenum does not cross the midline. Small
bowel loops are concentrated in the right abdomen with: Concentrated
in the left abdomen cecum is in the posterior pelvis anterior to the
lower sacrum. The appendix is normal. No gross colonic mass. No
colonic wall thickening. Mild diverticular changes noted left colon.

Vascular/Lymphatic: There is moderate atherosclerotic calcification
of the abdominal aorta without aneurysm. There is no gastrohepatic
or hepatoduodenal ligament lymphadenopathy. No retroperitoneal or
mesenteric lymphadenopathy. No pelvic sidewall lymphadenopathy.

Reproductive: Unremarkable.

Other: No intraperitoneal free fluid.

Musculoskeletal: No worrisome lytic or sclerotic osseous
abnormality.
IMPRESSION: 1. No acute findings in the abdomen or pelvis. No findings to
explain the patient's history of left lower quadrant pain.
2. Intestinal malrotation.
3. Hepatic and renal cysts.
4.  Aortic Atherosclerois ([id number]-170.0)
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
## 2023-09-18 ENCOUNTER — Other Ambulatory Visit: Payer: Self-pay | Admitting: Infectious Diseases

## 2023-09-18 DIAGNOSIS — Q433 Congenital malformations of intestinal fixation: Secondary | ICD-10-CM

## 2023-09-18 DIAGNOSIS — R1031 Right lower quadrant pain: Secondary | ICD-10-CM

## 2023-09-21 ENCOUNTER — Ambulatory Visit
Admission: RE | Admit: 2023-09-21 | Discharge: 2023-09-21 | Disposition: A | Discharge status: Home / Self Care | Source: Ambulatory Visit | Attending: Infectious Diseases | Admitting: Infectious Diseases

## 2023-09-21 DIAGNOSIS — Q433 Congenital malformations of intestinal fixation: Secondary | ICD-10-CM | POA: Diagnosis present

## 2023-09-21 DIAGNOSIS — R1031 Right lower quadrant pain: Secondary | ICD-10-CM | POA: Diagnosis present

## 2023-09-21 MED ORDER — IOHEXOL 300 MG/ML  SOLN
100.0000 mL | Freq: Once | INTRAMUSCULAR | Status: AC | PRN
  Administered 2023-09-21: 75 mL via INTRAVENOUS

## 2023-09-27 ENCOUNTER — Ambulatory Visit: Payer: Self-pay | Admitting: General Surgery

## 2023-09-27 NOTE — H&P (View-Only) (Signed)
 PATIENT PROFILE: Katie Franklin is a 68 y.o. female who presents to the Clinic for consultation at the request of Dr. Harwood Lingo for evaluation of right groin pain.   PCP:  Jerelyn Money, MD   History of Present Illness Patient endorses that she has been having pain in the right groin for the last few months.  Pain localized to the right groin.  Pain aggravated by activity.  No alleviating factors.  She endorses that she feels a lump that comes out to the right groin.  The lump is reduced when she lays down.  She also notices the lump bigger when she coughs or sneeze.  Denies episode of abdominal distention, nausea or vomiting.     PROBLEM LIST: Problem List  Date Reviewed: 09/13/2023          Noted    Mixed hyperlipidemia 03/29/2020    Primary cancer of left upper lobe of lung (CMS/HHS-HCC) 09/11/2017    Overview  Stage I adenocarcinoma   Last Assessment & Plan:  Formatting of this note might be different from the original. #Left upper lobe adenocarcinoma stage I; June 2022- CT chest NED; STABLE; will order CT scan today-annual.   # Post thoracotomy pain--STABLE; currently on tramadol ; increased neurontin  200 mg in AM; 200 mg in PM;    # History of breast cancer stage I; DEC 2022- Mammo-WNL- STABLE.    # abdominal pain [Dr.Locklear colonoscopy- Jan 31st, 2023];CT Ab 2022-OCT- no acute process.    # DISPOSITION: # Follow-up in 6 months-MD; labs- cbc/cmp; CT chest -- Dr.B        Mass of upper lobe of left lung 08/27/2017    Coronary artery disease involving native coronary artery of native heart without angina pectoris 08/02/2017    History of breast cancer 08/02/2017    Smoker 08/02/2017    Bursitis of shoulder 07/24/2017    Chronic midline low back pain without sciatica 07/24/2016    H. pylori infection 07/24/2016    Carcinoma of overlapping sites of left breast in female, estrogen receptor positive (CMS/HHS-HCC) 06/30/2016    Overview  Last Assessment & Plan:  # LEFT BREAST  CANCER- STAGE I- ER/PR- Positive; Her 2 Neg- s/p aromasin [x 5 years- April 2017].    # No evidence of recurrence; Mammogram in Nov 2018- WNL.    # lump under left axillasmall sebaceous cyst.  No signs of infection.  Monitor for now.   # Smoking/discussed lung cancer screening- counseled pt regarding quitting smoking. Pt interested; will refer to [facility] smoking    # Recommend lung cancer screening; discussed re: [facility] program interested  will inform Madelene Schanz.    # follow up in 12 months/labs.    Cc; Shawn Perkins/Hyaley Drayton.        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    COPD (chronic obstructive pulmonary disease) (CMS/HHS-HCC) 07/22/2014    Panic disorder Unknown    Tobacco use Unknown    Herpes labialis Unknown    Gastritis Unknown    Osteoporosis Unknown    Recurrent sinusitis Unknown    HTN (hypertension) Unknown    Breast cancer (CMS/HHS-HCC) Unknown    Overview  Status post lumpectomy, radiation, hormone therapy        Multinodular goiter (nontoxic) Unknown  GENERAL REVIEW OF SYSTEMS:    General ROS: negative for - chills, fatigue, fever, weight gain or weight loss Allergy and Immunology ROS: negative for - hives  Hematological and Lymphatic ROS: negative for - bleeding problems or bruising, negative for palpable nodes Endocrine ROS: negative for - heat or cold intolerance, hair changes Respiratory ROS: negative for - cough, shortness of breath or wheezing Cardiovascular ROS: no chest pain or palpitations GI ROS: negative for nausea, vomiting, abdominal pain, diarrhea, constipation Musculoskeletal ROS: negative for - joint swelling or muscle pain Neurological ROS: negative for - confusion, syncope Dermatological ROS: negative for pruritus and  rash Psychiatric: negative for anxiety, depression, difficulty sleeping and memory loss   MEDICATIONS: Current Medications        Current Outpatient Medications  Medication Sig Dispense Refill   acetaminophen  (TYLENOL ) 500 MG tablet Take 1,000 mg by mouth once daily as needed          alpha lipoic acid 600 mg Cap capsule Take 1 capsule by mouth 2 (two) times daily       ALPRAZolam  (XANAX ) 0.5 MG tablet Take 1 tablet (0.5 mg total) by mouth 2 (two) times daily as needed for Anxiety 60 tablet 3   atorvastatin  (LIPITOR) 10 MG tablet Take 1 tablet by mouth once daily       b complex vitamins capsule Take 1 capsule by mouth once daily       CALCIUM -VITAMIN D3 ORAL Take 1 tablet by mouth once daily          cholecalciferol, vitamin D3, (VITAMIN D3) 125 mcg (5,000 unit) tablet Take 5,000 Units by mouth once daily       cyanocobalamin  (VITAMIN B12) 1000 MCG tablet Take 1,000 mcg by mouth once daily.       denosumab (PROLIA) 60 mg/mL inj syringe INJECT 1 SYRINGE UNDER THE SKIN ONCE EVERY 6 MONTHS 1 mL 0   diphenhydrAMINE (BENADRYL) 50 MG capsule Take 1 capsule (50 mg total) by mouth as directed for Allergies 1 capsule 0   ezetimibe  (ZETIA ) 10 mg tablet Take 10 mg by mouth once daily       IDS Study Medication Take 1 each by mouth once daily Daily relief       loteprednol (LOTEMAX) 0.5 % ophthalmic suspension         methIMAzole (TAPAZOLE) 5 MG tablet Take 1/2 (one-half) tablet by mouth once daily 45 tablet 0   ondansetron  (ZOFRAN -ODT) 4 MG disintegrating tablet 1 po bid prn 30 tablet 0   pantoprazole  (PROTONIX ) 40 MG DR tablet Take 1 tablet (40 mg total) by mouth every morning before breakfast 90 tablet 2   peg 400-propylene glycol, PF, (SYSTANE ULTRA) 0.4-0.3 % ophthalmic drops Place 1 drop into both eyes as needed for Dry Eyes       polyethylene glycol (MIRALAX ) powder Take 17 g by mouth once daily as needed       predniSONE (DELTASONE) 50 MG tablet Take 1 tablet (50 mg total) by mouth as directed  3 tablet 0   pregabalin  (LYRICA ) 25 MG capsule Take 1 capsule (25 mg total) by mouth 2 (two) times daily 60 capsule 5   spironolactone  (ALDACTONE ) 25 MG tablet Take 25 mg by mouth once daily       traMADoL  (ULTRAM ) 50 mg tablet 1/2-1 po bid prn 60 tablet 2   vitamin E  400 UNIT capsule Take 400 Units by mouth once daily       denosumab (PROLIA) 60 mg/mL inj  syringe TO BE ADMINISTERED IN PHYSICIAN'S OFFICE. INJECT ONE SYRINGE SUBCUTANEOUSLY ONCE EVERY 6 MONTHS. REFRIGERATE. USE WITHIN 14 DAYS ONCE AT ROOM TEMPERATURE. (Patient not taking: Reported on 09/27/2023) 1 mL 0   levalbuterol (XOPENEX HFA) inhaler Inhale 2 inhalations into the lungs every 4 (four) hours as needed for Wheezing 45 g 3   mirtazapine (REMERON) 15 MG tablet Take 1 tablet (15 mg total) by mouth at bedtime (Patient not taking: Reported on 09/27/2023) 30 tablet 11    No current facility-administered medications for this visit.        ALLERGIES: Sulfasalazine, Aspirin, Erythromycin, Latex, Sulfa (sulfonamide antibiotics), Sulfamethoxazole-trimethoprim, Tetracycline, Demeclocycline, and Amoxicillin   PAST MEDICAL HISTORY: Past Medical History      Past Medical History:  Diagnosis Date   Allergy      latex allergy   Arthritis     Breast cancer (CMS-HCC)      Status post lumpectomy, radiation, hormone therapy   COPD (chronic obstructive pulmonary disease) (CMS-HCC) 07/22/2014   Deafness in left ear     Depression     Diverticulosis 12/28/2014   Emphysema of lung (CMS/HHS-HCC)     Gastritis     GERD (gastroesophageal reflux disease)     H. pylori infection 12/02/2015   Herpes labialis     History of chicken pox     HTN (hypertension)     Hyperlipidemia     Hyperplastic colon polyp 11/07/2011   Internal hemorrhoids 12/28/2014   Multinodular goiter (nontoxic)     Osteoporosis      a. Foot and rib fractures.  b. S/p Fosamax.  c. S/p Forteo.  (GWK)   Panic disorder     Recurrent sinusitis     Tobacco use     Tubular  adenoma of colon 12/28/2014    10/27/2008      Physical Exam     PAST SURGICAL HISTORY: Past Surgical History       Past Surgical History:  Procedure Laterality Date   LEFT BREAST CYST REMOVED Left 1980    Dr. Lorel Roes   LEFT BREAST CYST REMOVED Left 1980    Dr. Amalia Badder   LEFT BREAST CYST REMOVED Left 1982    [facility]   TONSILLECTOMY   1995    Dr. Mellody Sprout   BUNIONETTE EXCISION Right 2000    Dr. Sharlyn Deaner   SEPTOPLASTY   2004    Dr. Azalea Lento Vault   Cysts removed from backside of tongue Left 2007    Dr. Leonidas Ramming   INCISION TENDON SHEATH FOR TRIGGER FINGER Left 2010    Dr. Arlo Lama   EXCISION GANGLION CYST WRIST PRIMARY Right 2010    Dr. Arlo Lama   MASTECTOMY PARTIAL Left 07/2010    s/p radiation and chemo   BREAST BIOPSY Left 08/01/2010    Dr. Dominic Friendly; cancer 8mm   PARTIAL MASTECTOMY, REMOVE LEFT SENTINAL NODE Left 08/11/2010    Dr. Rich Champ Cooepr (invasive mammary carcinoma, lumpectomy with sentinel lymph node sampling)   EGD   10/22/2012   COLONOSCOPY   12/28/2014    Tubular adenoma of colon/Repeat 100yrs/MUS   EGD   12/02/2015    H. pylori infection/Gastritis/Reflux esophagitis/No Repeat/MUS   Right L5-S1 hemilaminectomy   11/06/2016    Dr Berta Brittle   EGD   04/13/2017    GERD/Negative for H. Pylori/No Repeat/MUS   CHOLECYSTECTOMY   06/11/2017    Dr Truman Gables   COLONOSCOPY   08/17/2017    Diverticulosis/No specimens collected/FHx  CP-Mother/Repeat 70yrs/MUS   CATARACT EXTRACTION Right 08/27/2019   CATARACT EXTRACTION Left 09/10/2019   EGD   04/13/2020    Gastritis/Repeat as needed/CTL   COLONOSCOPY   05/17/2021    Tubular adenoma/Poor colon prep/Repeat 6 months/CTL   Colon @ [facility]   08/14/2022    Colonoscopy with small TA. 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    Right side next to eye   EGD @ [facility]   05/16/2023    Hyperplastic gastric polyp >40mm/Repeat 41yr/SMR   back surgery        COLONOSCOPY   11/07/2011, 10/27/2008    Hyperplastic polyp/ PHx of Tubular adenoma   FRACTURE SURGERY   11-2021   PARTIAL MASTECTOMY Left          FAMILY HISTORY: Family History        Family History  Problem Relation Name Age of Onset   Prostate cancer Father Porfirio Bristol     High blood pressure (Hypertension) Father Porfirio Bristol     Alcohol  abuse Father Porfirio Bristol          Died of prostate cancer   Alzheimer's disease Mother Clara     Heart failure Mother Clara          CHF   High blood pressure (Hypertension) Mother Clara     Colon polyps Mother Clara     Depression Brother Cornel Diesel     Bipolar disorder Sister Robin          SOCIAL HISTORY: Social History  Social History         Socioeconomic History   Marital status: Married      Spouse name: Rutherford Cowing   Number of children: 0   Years of education: 12   Highest education level: GED or equivalent  Occupational History   Occupation: Retired  Tobacco Use   Smoking status: Former      Current packs/day: 0.00      Types: Cigarettes      Quit date: 2019      Years since quitting: 6.4   Smokeless tobacco: Never  Vaping Use   Vaping status: Never Used  Substance and Sexual Activity   Alcohol  use: No      Alcohol /week: 0.0 standard drinks of alcohol    Drug use: Never   Sexual activity: Yes      Partners: Female  Social History Narrative    Lives with partner    Tobacco 1ppd    Laid off in 2008 from [facility], now retired    Conservator, museum/gallery  - none    Social Drivers of Acupuncturist Strain: Low Risk  (09/27/2023)    Overall Financial Resource Strain (CARDIA)     Difficulty of Paying Living Expenses: Not hard at all  Food Insecurity: No Food Insecurity (09/27/2023)    Hunger Vital Sign     Worried About Running Out of Food in the Last Year: Never true     Ran Out of Food in the Last Year: Never true  Transportation Needs: No Transportation Needs (09/27/2023)    PRAPARE - Therapist, art  (Medical): No     Lack of Transportation (Non-Medical): No        PHYSICAL EXAM:    Vitals:    09/27/23 1400  BP: 122/75  Pulse: 71    Body mass index is 16.97 kg/m. Weight: 46.3 kg (102 lb)  GENERAL: Alert, active, oriented x3   HEENT: Pupils equal reactive to light. Extraocular movements are intact. Sclera clear. Palpebral conjunctiva normal red color.Pharynx clear.   NECK: Supple with no palpable mass and no adenopathy.   LUNGS: Sound clear with no rales rhonchi or wheezes.   HEART: Regular rhythm S1 and S2 without murmur.   ABDOMEN: Soft and depressible, nontender with no palpable mass, no hepatomegaly.  Reducible bulge in the right groin.  Weakness of the left groin.   EXTREMITIES: Well-developed well-nourished symmetrical with no dependent edema.   NEUROLOGICAL: Awake alert oriented, facial expression symmetrical, moving all extremities.   REVIEW OF DATA: I have reviewed the following data today:      Office Visit on 09/14/2023  Component Date Value   Glucose 09/14/2023 100    Sodium 09/14/2023 141    Potassium 09/14/2023 4.1    Chloride 09/14/2023 105    Carbon Dioxide (CO2) 09/14/2023 31.4    Calcium  09/14/2023 9.4    Urea Nitrogen (BUN) 09/14/2023 8    Creatinine 09/14/2023 0.7    Glomerular Filtration Ra* 09/14/2023 93    BUN/Crea Ratio 09/14/2023 11.4    Anion Gap w/K 09/14/2023 8.7    WBC (White Blood Cell Co* 09/14/2023 6.9    RBC (Red Blood Cell Coun* 09/14/2023 4.37    Hemoglobin 09/14/2023 13.6    Hematocrit 09/14/2023 41.1    MCV (Mean Corpuscular Vo* 09/14/2023 94.1    MCH (Mean Corpuscular He* 09/14/2023 31.1    MCHC (Mean Corpuscular H* 09/14/2023 33.1    Platelet Count 09/14/2023 250    RDW-CV (Red Cell Distrib* 09/14/2023 13.2    MPV (Mean Platelet Volum* 09/14/2023 10.2    Neutrophils 09/14/2023 4.59    Lymphocytes 09/14/2023 1.59    Monocytes 09/14/2023 0.58    Eosinophils 09/14/2023 0.04    Basophils 09/14/2023 0.05     Neutrophil % 09/14/2023 66.8    Lymphocyte % 09/14/2023 23.1    Monocyte % 09/14/2023 8.4    Eosinophil % 09/14/2023 0.6 (L)    Basophil% 09/14/2023 0.7    Immature Granulocyte % 09/14/2023 0.4    Immature Granulocyte Cou* 09/14/2023 0.03   Office Visit on 09/12/2023  Component Date Value   Thyroid  Stimulating Horm* 09/12/2023 1.241    Thyroxine, Free (FT4) 09/12/2023 0.93   Appointment on 08/01/2023  Component Date Value   Color 08/01/2023 Colorless    Clarity 08/01/2023 Clear    Specific Gravity 08/01/2023 1.001 (L)    pH, Urine 08/01/2023 6.5    Protein, Urinalysis 08/01/2023 Negative    Glucose, Urinalysis 08/01/2023 Negative    Ketones, Urinalysis 08/01/2023 Negative    Blood, Urinalysis 08/01/2023 3+ (!)    Nitrite, Urinalysis 08/01/2023 Negative    Leukocyte Esterase, Urin* 08/01/2023 2+ (!)    Bilirubin, Urinalysis 08/01/2023 Negative    Urobilinogen, Urinalysis 08/01/2023 0.2    WBC, UA 08/01/2023 7 (H)    Red Blood Cells, Urinaly* 08/01/2023 0    Bacteria, Urinalysis 08/01/2023 0-5    Squamous Epithelial Cell* 08/01/2023 0    Urine Culture, Routine -* 08/01/2023 Final report (!)    Result 1 - [facility] 08/01/2023 Escherichia coli (!)    Antimicrobial Susceptibi* 08/01/2023 Comment     Results I personally evaluated the CT scan of the abdomen and pelvis that shows at least a call lipoma on the right groin   Assessment & Plan Patient was oriented about the diagnosis of right inguinal hernia causing her right groin pain.   The  patient was oriented about the treatment alternatives (observation vs surgical repair). Due to patient symptoms, repair is recommended. Patient oriented about the surgical procedure, the use of mesh and its risk of complications such as: infection, bleeding, injury to vas deference, vasculature and testicle, injury to bowel or bladder, and chronic pain.    Non-recurrent unilateral inguinal hernia without obstruction or gangrene [K40.90]      Patient verbalized understanding, all questions were answered, and were agreeable with the plan outlined above.      Eldred Grego, MD   Electronically signed by Eldred Grego, MD

## 2023-09-27 NOTE — H&P (Signed)
 PATIENT PROFILE: Katie Franklin is a 68 y.o. female who presents to the Clinic for consultation at the request of Dr. Harwood Lingo for evaluation of right groin pain.   PCP:  Jerelyn Money, MD   History of Present Illness Patient endorses that she has been having pain in the right groin for the last few months.  Pain localized to the right groin.  Pain aggravated by activity.  No alleviating factors.  She endorses that she feels a lump that comes out to the right groin.  The lump is reduced when she lays down.  She also notices the lump bigger when she coughs or sneeze.  Denies episode of abdominal distention, nausea or vomiting.     PROBLEM LIST: Problem List  Date Reviewed: 09/13/2023          Noted    Mixed hyperlipidemia 03/29/2020    Primary cancer of left upper lobe of lung (CMS/HHS-HCC) 09/11/2017    Overview  Stage I adenocarcinoma   Last Assessment & Plan:  Formatting of this note might be different from the original. #Left upper lobe adenocarcinoma stage I; June 2022- CT chest NED; STABLE; will order CT scan today-annual.   # Post thoracotomy pain--STABLE; currently on tramadol ; increased neurontin  200 mg in AM; 200 mg in PM;    # History of breast cancer stage I; DEC 2022- Mammo-WNL- STABLE.    # abdominal pain [Dr.Locklear colonoscopy- Jan 31st, 2023];CT Ab 2022-OCT- no acute process.    # DISPOSITION: # Follow-up in 6 months-MD; labs- cbc/cmp; CT chest -- Dr.B        Mass of upper lobe of left lung 08/27/2017    Coronary artery disease involving native coronary artery of native heart without angina pectoris 08/02/2017    History of breast cancer 08/02/2017    Smoker 08/02/2017    Bursitis of shoulder 07/24/2017    Chronic midline low back pain without sciatica 07/24/2016    H. pylori infection 07/24/2016    Carcinoma of overlapping sites of left breast in female, estrogen receptor positive (CMS/HHS-HCC) 06/30/2016    Overview  Last Assessment & Plan:  # LEFT BREAST  CANCER- STAGE I- ER/PR- Positive; Her 2 Neg- s/p aromasin [x 5 years- April 2017].    # No evidence of recurrence; Mammogram in Nov 2018- WNL.    # lump under left axillasmall sebaceous cyst.  No signs of infection.  Monitor for now.   # Smoking/discussed lung cancer screening- counseled pt regarding quitting smoking. Pt interested; will refer to [facility] smoking    # Recommend lung cancer screening; discussed re: LCS program interested  will inform Madelene Schanz.    # follow up in 12 months/labs.    Cc; Shawn Perkins/Hyaley Drayton.        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    COPD (chronic obstructive pulmonary disease) (CMS/HHS-HCC) 07/22/2014    Panic disorder Unknown    Tobacco use Unknown    Herpes labialis Unknown    Gastritis Unknown    Osteoporosis Unknown    Recurrent sinusitis Unknown    HTN (hypertension) Unknown    Breast cancer (CMS/HHS-HCC) Unknown    Overview  Status post lumpectomy, radiation, hormone therapy        Multinodular goiter (nontoxic) Unknown  GENERAL REVIEW OF SYSTEMS:    General ROS: negative for - chills, fatigue, fever, weight gain or weight loss Allergy and Immunology ROS: negative for - hives  Hematological and Lymphatic ROS: negative for - bleeding problems or bruising, negative for palpable nodes Endocrine ROS: negative for - heat or cold intolerance, hair changes Respiratory ROS: negative for - cough, shortness of breath or wheezing Cardiovascular ROS: no chest pain or palpitations GI ROS: negative for nausea, vomiting, abdominal pain, diarrhea, constipation Musculoskeletal ROS: negative for - joint swelling or muscle pain Neurological ROS: negative for - confusion, syncope Dermatological ROS: negative for pruritus and  rash Psychiatric: negative for anxiety, depression, difficulty sleeping and memory loss   MEDICATIONS: Current Medications        Current Outpatient Medications  Medication Sig Dispense Refill   acetaminophen  (TYLENOL ) 500 MG tablet Take 1,000 mg by mouth once daily as needed          alpha lipoic acid 600 mg Cap capsule Take 1 capsule by mouth 2 (two) times daily       ALPRAZolam  (XANAX ) 0.5 MG tablet Take 1 tablet (0.5 mg total) by mouth 2 (two) times daily as needed for Anxiety 60 tablet 3   atorvastatin  (LIPITOR) 10 MG tablet Take 1 tablet by mouth once daily       b complex vitamins capsule Take 1 capsule by mouth once daily       CALCIUM -VITAMIN D3 ORAL Take 1 tablet by mouth once daily          cholecalciferol, vitamin D3, (VITAMIN D3) 125 mcg (5,000 unit) tablet Take 5,000 Units by mouth once daily       cyanocobalamin  (VITAMIN B12) 1000 MCG tablet Take 1,000 mcg by mouth once daily.       denosumab (PROLIA) 60 mg/mL inj syringe INJECT 1 SYRINGE UNDER THE SKIN ONCE EVERY 6 MONTHS 1 mL 0   diphenhydrAMINE (BENADRYL) 50 MG capsule Take 1 capsule (50 mg total) by mouth as directed for Allergies 1 capsule 0   ezetimibe  (ZETIA ) 10 mg tablet Take 10 mg by mouth once daily       IDS Study Medication Take 1 each by mouth once daily Daily relief       loteprednol (LOTEMAX) 0.5 % ophthalmic suspension         methIMAzole (TAPAZOLE) 5 MG tablet Take 1/2 (one-half) tablet by mouth once daily 45 tablet 0   ondansetron  (ZOFRAN -ODT) 4 MG disintegrating tablet 1 po bid prn 30 tablet 0   pantoprazole  (PROTONIX ) 40 MG DR tablet Take 1 tablet (40 mg total) by mouth every morning before breakfast 90 tablet 2   peg 400-propylene glycol, PF, (SYSTANE ULTRA) 0.4-0.3 % ophthalmic drops Place 1 drop into both eyes as needed for Dry Eyes       polyethylene glycol (MIRALAX ) powder Take 17 g by mouth once daily as needed       predniSONE (DELTASONE) 50 MG tablet Take 1 tablet (50 mg total) by mouth as directed  3 tablet 0   pregabalin  (LYRICA ) 25 MG capsule Take 1 capsule (25 mg total) by mouth 2 (two) times daily 60 capsule 5   spironolactone  (ALDACTONE ) 25 MG tablet Take 25 mg by mouth once daily       traMADoL  (ULTRAM ) 50 mg tablet 1/2-1 po bid prn 60 tablet 2   vitamin E  400 UNIT capsule Take 400 Units by mouth once daily       denosumab (PROLIA) 60 mg/mL inj  syringe TO BE ADMINISTERED IN PHYSICIAN'S OFFICE. INJECT ONE SYRINGE SUBCUTANEOUSLY ONCE EVERY 6 MONTHS. REFRIGERATE. USE WITHIN 14 DAYS ONCE AT ROOM TEMPERATURE. (Patient not taking: Reported on 09/27/2023) 1 mL 0   levalbuterol (XOPENEX HFA) inhaler Inhale 2 inhalations into the lungs every 4 (four) hours as needed for Wheezing 45 g 3   mirtazapine (REMERON) 15 MG tablet Take 1 tablet (15 mg total) by mouth at bedtime (Patient not taking: Reported on 09/27/2023) 30 tablet 11    No current facility-administered medications for this visit.        ALLERGIES: Sulfasalazine, Aspirin, Erythromycin, Latex, Sulfa (sulfonamide antibiotics), Sulfamethoxazole-trimethoprim, Tetracycline, Demeclocycline, and Amoxicillin   PAST MEDICAL HISTORY: Past Medical History      Past Medical History:  Diagnosis Date   Allergy      latex allergy   Arthritis     Breast cancer (CMS-HCC)      Status post lumpectomy, radiation, hormone therapy   COPD (chronic obstructive pulmonary disease) (CMS-HCC) 07/22/2014   Deafness in left ear     Depression     Diverticulosis 12/28/2014   Emphysema of lung (CMS/HHS-HCC)     Gastritis     GERD (gastroesophageal reflux disease)     H. pylori infection 12/02/2015   Herpes labialis     History of chicken pox     HTN (hypertension)     Hyperlipidemia     Hyperplastic colon polyp 11/07/2011   Internal hemorrhoids 12/28/2014   Multinodular goiter (nontoxic)     Osteoporosis      a. Foot and rib fractures.  b. S/p Fosamax.  c. S/p Forteo.  (GWK)   Panic disorder     Recurrent sinusitis     Tobacco use     Tubular  adenoma of colon 12/28/2014    10/27/2008      Physical Exam     PAST SURGICAL HISTORY: Past Surgical History       Past Surgical History:  Procedure Laterality Date   LEFT BREAST CYST REMOVED Left 1980    Dr. Lorel Roes   LEFT BREAST CYST REMOVED Left 1980    Dr. Amalia Badder   LEFT BREAST CYST REMOVED Left 1982    [facility]   TONSILLECTOMY   1995    Dr. Mellody Sprout   BUNIONETTE EXCISION Right 2000    Dr. Sharlyn Deaner   SEPTOPLASTY   2004    Dr. Azalea Lento Vault   Cysts removed from backside of tongue Left 2007    Dr. Leonidas Ramming   INCISION TENDON SHEATH FOR TRIGGER FINGER Left 2010    Dr. Arlo Lama   EXCISION GANGLION CYST WRIST PRIMARY Right 2010    Dr. Arlo Lama   MASTECTOMY PARTIAL Left 07/2010    s/p radiation and chemo   BREAST BIOPSY Left 08/01/2010    Dr. Dominic Friendly; cancer 8mm   PARTIAL MASTECTOMY, REMOVE LEFT SENTINAL NODE Left 08/11/2010    Dr. Rich Champ Cooepr (invasive mammary carcinoma, lumpectomy with sentinel lymph node sampling)   EGD   10/22/2012   COLONOSCOPY   12/28/2014    Tubular adenoma of colon/Repeat 100yrs/MUS   EGD   12/02/2015    H. pylori infection/Gastritis/Reflux esophagitis/No Repeat/MUS   Right L5-S1 hemilaminectomy   11/06/2016    Dr Berta Brittle   EGD   04/13/2017    GERD/Negative for H. Pylori/No Repeat/MUS   CHOLECYSTECTOMY   06/11/2017    Dr Truman Gables   COLONOSCOPY   08/17/2017    Diverticulosis/No specimens collected/FHx  CP-Mother/Repeat 70yrs/MUS   CATARACT EXTRACTION Right 08/27/2019   CATARACT EXTRACTION Left 09/10/2019   EGD   04/13/2020    Gastritis/Repeat as needed/CTL   COLONOSCOPY   05/17/2021    Tubular adenoma/Poor colon prep/Repeat 6 months/CTL   Colon @ [facility]   08/14/2022    Colonoscopy with small TA. 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    Right side next to eye   EGD @ [facility]   05/16/2023    Hyperplastic gastric polyp >40mm/Repeat 41yr/SMR   back surgery        COLONOSCOPY   11/07/2011, 10/27/2008    Hyperplastic polyp/ PHx of Tubular adenoma   FRACTURE SURGERY   11-2021   PARTIAL MASTECTOMY Left          FAMILY HISTORY: Family History        Family History  Problem Relation Name Age of Onset   Prostate cancer Father Porfirio Bristol     High blood pressure (Hypertension) Father Porfirio Bristol     Alcohol  abuse Father Porfirio Bristol          Died of prostate cancer   Alzheimer's disease Mother Clara     Heart failure Mother Clara          CHF   High blood pressure (Hypertension) Mother Clara     Colon polyps Mother Clara     Depression Brother Cornel Diesel     Bipolar disorder Sister Robin          SOCIAL HISTORY: Social History  Social History         Socioeconomic History   Marital status: Married      Spouse name: Rutherford Cowing   Number of children: 0   Years of education: 12   Highest education level: GED or equivalent  Occupational History   Occupation: Retired  Tobacco Use   Smoking status: Former      Current packs/day: 0.00      Types: Cigarettes      Quit date: 2019      Years since quitting: 6.4   Smokeless tobacco: Never  Vaping Use   Vaping status: Never Used  Substance and Sexual Activity   Alcohol  use: No      Alcohol /week: 0.0 standard drinks of alcohol    Drug use: Never   Sexual activity: Yes      Partners: Female  Social History Narrative    Lives with partner    Tobacco 1ppd    Laid off in 2008 from [facility], now retired    Conservator, museum/gallery  - none    Social Drivers of Acupuncturist Strain: Low Risk  (09/27/2023)    Overall Financial Resource Strain (CARDIA)     Difficulty of Paying Living Expenses: Not hard at all  Food Insecurity: No Food Insecurity (09/27/2023)    Hunger Vital Sign     Worried About Running Out of Food in the Last Year: Never true     Ran Out of Food in the Last Year: Never true  Transportation Needs: No Transportation Needs (09/27/2023)    PRAPARE - Therapist, art  (Medical): No     Lack of Transportation (Non-Medical): No        PHYSICAL EXAM:    Vitals:    09/27/23 1400  BP: 122/75  Pulse: 71    Body mass index is 16.97 kg/m. Weight: 46.3 kg (102 lb)  GENERAL: Alert, active, oriented x3   HEENT: Pupils equal reactive to light. Extraocular movements are intact. Sclera clear. Palpebral conjunctiva normal red color.Pharynx clear.   NECK: Supple with no palpable mass and no adenopathy.   LUNGS: Sound clear with no rales rhonchi or wheezes.   HEART: Regular rhythm S1 and S2 without murmur.   ABDOMEN: Soft and depressible, nontender with no palpable mass, no hepatomegaly.  Reducible bulge in the right groin.  Weakness of the left groin.   EXTREMITIES: Well-developed well-nourished symmetrical with no dependent edema.   NEUROLOGICAL: Awake alert oriented, facial expression symmetrical, moving all extremities.   REVIEW OF DATA: I have reviewed the following data today:      Office Visit on 09/14/2023  Component Date Value   Glucose 09/14/2023 100    Sodium 09/14/2023 141    Potassium 09/14/2023 4.1    Chloride 09/14/2023 105    Carbon Dioxide (CO2) 09/14/2023 31.4    Calcium  09/14/2023 9.4    Urea Nitrogen (BUN) 09/14/2023 8    Creatinine 09/14/2023 0.7    Glomerular Filtration Ra* 09/14/2023 93    BUN/Crea Ratio 09/14/2023 11.4    Anion Gap w/K 09/14/2023 8.7    WBC (White Blood Cell Co* 09/14/2023 6.9    RBC (Red Blood Cell Coun* 09/14/2023 4.37    Hemoglobin 09/14/2023 13.6    Hematocrit 09/14/2023 41.1    MCV (Mean Corpuscular Vo* 09/14/2023 94.1    MCH (Mean Corpuscular He* 09/14/2023 31.1    MCHC (Mean Corpuscular H* 09/14/2023 33.1    Platelet Count 09/14/2023 250    RDW-CV (Red Cell Distrib* 09/14/2023 13.2    MPV (Mean Platelet Volum* 09/14/2023 10.2    Neutrophils 09/14/2023 4.59    Lymphocytes 09/14/2023 1.59    Monocytes 09/14/2023 0.58    Eosinophils 09/14/2023 0.04    Basophils 09/14/2023 0.05     Neutrophil % 09/14/2023 66.8    Lymphocyte % 09/14/2023 23.1    Monocyte % 09/14/2023 8.4    Eosinophil % 09/14/2023 0.6 (L)    Basophil% 09/14/2023 0.7    Immature Granulocyte % 09/14/2023 0.4    Immature Granulocyte Cou* 09/14/2023 0.03   Office Visit on 09/12/2023  Component Date Value   Thyroid  Stimulating Horm* 09/12/2023 1.241    Thyroxine, Free (FT4) 09/12/2023 0.93   Appointment on 08/01/2023  Component Date Value   Color 08/01/2023 Colorless    Clarity 08/01/2023 Clear    Specific Gravity 08/01/2023 1.001 (L)    pH, Urine 08/01/2023 6.5    Protein, Urinalysis 08/01/2023 Negative    Glucose, Urinalysis 08/01/2023 Negative    Ketones, Urinalysis 08/01/2023 Negative    Blood, Urinalysis 08/01/2023 3+ (!)    Nitrite, Urinalysis 08/01/2023 Negative    Leukocyte Esterase, Urin* 08/01/2023 2+ (!)    Bilirubin, Urinalysis 08/01/2023 Negative    Urobilinogen, Urinalysis 08/01/2023 0.2    WBC, UA 08/01/2023 7 (H)    Red Blood Cells, Urinaly* 08/01/2023 0    Bacteria, Urinalysis 08/01/2023 0-5    Squamous Epithelial Cell* 08/01/2023 0    Urine Culture, Routine -* 08/01/2023 Final report (!)    Result 1 - [facility] 08/01/2023 Escherichia coli (!)    Antimicrobial Susceptibi* 08/01/2023 Comment     Results I personally evaluated the CT scan of the abdomen and pelvis that shows at least a call lipoma on the right groin   Assessment & Plan Patient was oriented about the diagnosis of right inguinal hernia causing her right groin pain.   The  patient was oriented about the treatment alternatives (observation vs surgical repair). Due to patient symptoms, repair is recommended. Patient oriented about the surgical procedure, the use of mesh and its risk of complications such as: infection, bleeding, injury to vas deference, vasculature and testicle, injury to bowel or bladder, and chronic pain.    Non-recurrent unilateral inguinal hernia without obstruction or gangrene [K40.90]      Patient verbalized understanding, all questions were answered, and were agreeable with the plan outlined above.      Eldred Grego, MD   Electronically signed by Eldred Grego, MD

## 2023-10-03 ENCOUNTER — Encounter
Admission: RE | Admit: 2023-10-03 | Discharge: 2023-10-03 | Disposition: A | Discharge status: Home / Self Care | Source: Ambulatory Visit | Attending: General Surgery | Admitting: General Surgery

## 2023-10-03 ENCOUNTER — Other Ambulatory Visit: Payer: Self-pay

## 2023-10-03 ENCOUNTER — Encounter: Payer: Self-pay | Admitting: General Surgery

## 2023-10-03 ENCOUNTER — Telehealth: Payer: Self-pay | Admitting: Cardiovascular Disease

## 2023-10-03 DIAGNOSIS — Z01818 Encounter for other preprocedural examination: Secondary | ICD-10-CM | POA: Diagnosis present

## 2023-10-03 DIAGNOSIS — I10 Essential (primary) hypertension: Secondary | ICD-10-CM | POA: Insufficient documentation

## 2023-10-03 DIAGNOSIS — J449 Chronic obstructive pulmonary disease, unspecified: Secondary | ICD-10-CM | POA: Insufficient documentation

## 2023-10-03 DIAGNOSIS — R9431 Abnormal electrocardiogram [ECG] [EKG]: Secondary | ICD-10-CM | POA: Insufficient documentation

## 2023-10-03 DIAGNOSIS — I2510 Atherosclerotic heart disease of native coronary artery without angina pectoris: Secondary | ICD-10-CM | POA: Insufficient documentation

## 2023-10-03 HISTORY — DX: Thyrotoxicosis, unspecified without thyrotoxic crisis or storm: E05.90

## 2023-10-03 HISTORY — DX: Unspecified osteoarthritis, unspecified site: M19.90

## 2023-10-03 MED ORDER — SPIRONOLACTONE 25 MG PO TABS: 25.0000 mg | ORAL_TABLET | Freq: Every day | ORAL | 0 refills | Status: DC

## 2023-10-03 MED ORDER — ATORVASTATIN CALCIUM 10 MG PO TABS: 10.0000 mg | ORAL_TABLET | Freq: Every day | ORAL | 0 refills | Status: DC

## 2023-10-03 MED ORDER — EZETIMIBE 10 MG PO TABS: 10.0000 mg | ORAL_TABLET | Freq: Every day | ORAL | 0 refills | Status: DC

## 2023-10-03 NOTE — Telephone Encounter (Signed)
*  STAT* If patient is at the pharmacy, call can be transferred to refill team.   1. Which medications need to be refilled? (please list name of each medication and dose if known) ezetimibe  10 mg tablet, atorvastatin  10 mg tablet and spironolactone  25 mg table   2. Would you like to learn more about the convenience, safety, & potential cost savings by using the [facility]? no   3. Are you open to using the [facility] (Type [facility]. no ). no   4. Which pharmacy/location (including street and city if local pharmacy) is medication to be sent to? [address], [place]   5. Do they need a 30 day or 90 day supply? 90

## 2023-10-03 NOTE — Telephone Encounter (Signed)
 Pt's medications were sent to pt's pharmacy as requested. Confirmation received.

## 2023-10-03 NOTE — Patient Instructions (Addendum)
 Your procedure is scheduled on: 10/08/23 - Monday Report to the Registration Desk on the 1st floor of the [facility]. To find out your arrival time, please call [phone] between 1PM - 3PM on: 10/05/23 - Friday If your arrival time is 6:00 am, do not arrive before that time as the [facility] entrance doors do not open until 6:00 am.  REMEMBER: Instructions that are not followed completely may result in serious medical risk, up to and including death; or upon the discretion of your surgeon and anesthesiologist your surgery may need to be rescheduled.  Do not eat food or drink any liquids after midnight the night before surgery.  No gum chewing or hard candies.  One week prior to surgery: Stop Anti-inflammatories (NSAIDS) such as Advil , Aleve, Ibuprofen , Motrin , Naproxen, Naprosyn and Aspirin based products such as Excedrin, Goody's Powder, BC Powder. You may take Tylenol  if needed for pain up until the day of surgery.  Stop taking beginning 10/03/23, ANY OVER THE COUNTER supplements until after surgery.  Continue taking all of your other prescription medications up until the day before surgery.  ON THE DAY OF SURGERY ONLY TAKE THESE MEDICATIONS WITH SIPS OF WATER:  methimazole (TAPAZOLE)  pantoprazole  (PROTONIX )  ALPRAZolam  (XANAX ) if needed   Use inhalers on the day of surgery and bring to the hospital.   No Alcohol  for 24 hours before or after surgery.  No Smoking including e-cigarettes for 24 hours before surgery.  No chewable tobacco products for at least 6 hours before surgery.  No nicotine  patches on the day of surgery.  Do not use any recreational drugs for at least a week (preferably 2 weeks) before your surgery.  Please be advised that the combination of cocaine and anesthesia may have negative outcomes, up to and including death. If you test positive for cocaine, your surgery will be cancelled.  On the morning of surgery brush your teeth with toothpaste and  water, you may rinse your mouth with mouthwash if you wish. Do not swallow any toothpaste or mouthwash.  Use CHG Soap or wipes as directed on instruction sheet.  Do not wear jewelry, make-up, hairpins, clips or nail polish.  For welded (permanent) jewelry: bracelets, anklets, waist bands, etc.  Please have this removed prior to surgery.  If it is not removed, there is a chance that hospital personnel will need to cut it off on the day of surgery.  Do not wear lotions, powders, or perfumes.   Do not shave body hair from the neck down 48 hours before surgery.  Contact lenses, hearing aids and dentures may not be worn into surgery.  Do not bring valuables to the hospital. [facility] is not responsible for any missing/lost belongings or valuables.   Notify your doctor if there is any change in your medical condition (cold, fever, infection).  Wear comfortable clothing (specific to your surgery type) to the hospital.  After surgery, you can help prevent lung complications by doing breathing exercises.  Take deep breaths and cough every 1-2 hours. Your doctor may order a device called an Incentive Spirometer to help you take deep breaths. When coughing or sneezing, hold a pillow firmly against your incision with both hands. This is called "splinting." Doing this helps protect your incision. It also decreases belly discomfort.  If you are being admitted to the hospital overnight, leave your suitcase in the car. After surgery it may be brought to your room.  In case of increased patient census, it  may be necessary for you, the patient, to continue your postoperative care in the Same Day Surgery department.  If you are being discharged the day of surgery, you will not be allowed to drive home. You will need a responsible individual to drive you home and stay with you for 24 hours after surgery.   If you are taking public transportation, you will need to have a responsible individual with  you.  Please call the Pre-admissions Testing Dept. at [phone] if you have any questions about these instructions.  Surgery Visitation Policy:  Patients having surgery or a procedure may have two visitors.  Children under the age of 32 must have an adult with them who is not the patient.  Inpatient Visitation:    Visiting hours are 7 a.m. to 8 p.m. Up to four visitors are allowed at one time in a patient room. The visitors may rotate out with other people during the day.  One visitor age 62 or older may stay with the patient overnight and must be in the room by 8 p.m.    Preparing for Surgery with CHLORHEXIDINE  GLUCONATE (CHG) Soap  Chlorhexidine  Gluconate (CHG) Soap  o An antiseptic cleaner that kills germs and bonds with the skin to continue killing germs even after washing  o Used for showering the night before surgery and morning of surgery  Before surgery, you can play an important role by reducing the number of germs on your skin.  CHG (Chlorhexidine  gluconate) soap is an antiseptic cleanser which kills germs and bonds with the skin to continue killing germs even after washing.  Please do not use if you have an allergy to CHG or antibacterial soaps. If your skin becomes reddened/irritated stop using the CHG.  1. Shower the NIGHT BEFORE SURGERY and the MORNING OF SURGERY with CHG soap.  2. If you choose to wash your hair, wash your hair first as usual with your normal shampoo.  3. After shampooing, rinse your hair and body thoroughly to remove the shampoo.  4. Use CHG as you would any other liquid soap. You can apply CHG directly to the skin and wash gently with a scrungie or a clean washcloth.  5. Apply the CHG soap to your body only from the neck down. Do not use on open wounds or open sores. Avoid contact with your eyes, ears, mouth, and genitals (private parts). Wash face and genitals (private parts) with your normal soap.  6. Wash thoroughly, paying special  attention to the area where your surgery will be performed.  7. Thoroughly rinse your body with warm water.  8. Do not shower/wash with your normal soap after using and rinsing off the CHG soap.  9. Pat yourself dry with a clean towel.  10. Wear clean pajamas to bed the night before surgery.  12. Place clean sheets on your bed the night of your first shower and do not sleep with pets.  13. Shower again with the CHG soap on the day of surgery prior to arriving at the hospital.  14. Do not apply any deodorants/lotions/powders.  15. Please wear clean clothes to the hospital.

## 2023-10-07 MED ORDER — CEFAZOLIN SODIUM-DEXTROSE 2-4 GM/100ML-% IV SOLN
2.0000 g | INTRAVENOUS | Status: AC
  Administered 2023-10-08: 2 g via INTRAVENOUS

## 2023-10-07 MED ORDER — LACTATED RINGERS IV SOLN: INTRAVENOUS | Status: DC

## 2023-10-07 MED ORDER — CHLORHEXIDINE GLUCONATE 0.12 % MT SOLN
15.0000 mL | Freq: Once | OROMUCOSAL | Status: AC
Start: 2023-10-07 — End: 2023-10-08
  Administered 2023-10-08: 15 mL via OROMUCOSAL

## 2023-10-07 MED ORDER — ORAL CARE MOUTH RINSE
15.0000 mL | Freq: Once | OROMUCOSAL | Status: AC
Start: 2023-10-07 — End: 2023-10-08

## 2023-10-08 ENCOUNTER — Ambulatory Visit: Payer: Self-pay | Admitting: Urgent Care

## 2023-10-08 ENCOUNTER — Other Ambulatory Visit: Payer: Self-pay

## 2023-10-08 ENCOUNTER — Encounter: Payer: Self-pay | Admitting: General Surgery

## 2023-10-08 ENCOUNTER — Encounter
Admission: RE | Disposition: A | Discharge status: Home / Self Care | Payer: Self-pay | Source: Home / Self Care | Attending: General Surgery

## 2023-10-08 ENCOUNTER — Ambulatory Visit
Admission: RE | Admit: 2023-10-08 | Discharge: 2023-10-08 | Disposition: A | Discharge status: Home / Self Care | Attending: General Surgery | Admitting: General Surgery

## 2023-10-08 DIAGNOSIS — E0590 Thyrotoxicosis, unspecified without thyrotoxic crisis or storm: Secondary | ICD-10-CM | POA: Diagnosis not present

## 2023-10-08 DIAGNOSIS — K4090 Unilateral inguinal hernia, without obstruction or gangrene, not specified as recurrent: Secondary | ICD-10-CM | POA: Insufficient documentation

## 2023-10-08 DIAGNOSIS — K219 Gastro-esophageal reflux disease without esophagitis: Secondary | ICD-10-CM | POA: Insufficient documentation

## 2023-10-08 DIAGNOSIS — I10 Essential (primary) hypertension: Secondary | ICD-10-CM | POA: Insufficient documentation

## 2023-10-08 DIAGNOSIS — J449 Chronic obstructive pulmonary disease, unspecified: Secondary | ICD-10-CM | POA: Diagnosis not present

## 2023-10-08 DIAGNOSIS — I2510 Atherosclerotic heart disease of native coronary artery without angina pectoris: Secondary | ICD-10-CM | POA: Diagnosis not present

## 2023-10-08 HISTORY — PX: REPAIR, HERNIA, INGUINAL, BILATERAL, ROBOT-ASSISTED: SHX7636

## 2023-10-08 SURGERY — REPAIR, HERNIA, INGUINAL, BILATERAL, ROBOT-ASSISTED
Anesthesia: General | Site: Inguinal | Laterality: Right

## 2023-10-08 MED ORDER — FENTANYL CITRATE (PF) 100 MCG/2ML IJ SOLN
INTRAMUSCULAR | Status: AC
Start: 2023-10-08 — End: 2023-10-08
  Filled 2023-10-08: qty 2

## 2023-10-08 MED ORDER — FENTANYL CITRATE (PF) 100 MCG/2ML IJ SOLN
INTRAMUSCULAR | Status: DC | PRN
  Administered 2023-10-08 (×2): 25 ug via INTRAVENOUS
  Administered 2023-10-08: 50 ug via INTRAVENOUS

## 2023-10-08 MED ORDER — BUPIVACAINE-EPINEPHRINE 0.25% -1:200000 IJ SOLN
INTRAMUSCULAR | Status: DC | PRN
  Administered 2023-10-08: 30 mL

## 2023-10-08 MED ORDER — ONDANSETRON HCL 4 MG/2ML IJ SOLN
INTRAMUSCULAR | Status: DC | PRN
  Administered 2023-10-08 (×2): 4 mg via INTRAVENOUS

## 2023-10-08 MED ORDER — SUGAMMADEX SODIUM 200 MG/2ML IV SOLN
INTRAVENOUS | Status: DC | PRN
  Administered 2023-10-08: 200 mg via INTRAVENOUS

## 2023-10-08 MED ORDER — BUPIVACAINE-EPINEPHRINE (PF) 0.25% -1:200000 IJ SOLN
INTRAMUSCULAR | Status: AC
Start: 2023-10-08 — End: 2023-10-08
  Filled 2023-10-08: qty 30

## 2023-10-08 MED ORDER — OXYCODONE HCL 5 MG PO TABS
ORAL_TABLET | ORAL | Status: AC
  Filled 2023-10-08: qty 1

## 2023-10-08 MED ORDER — FENTANYL CITRATE (PF) 100 MCG/2ML IJ SOLN
INTRAMUSCULAR | Status: AC
  Filled 2023-10-08: qty 2

## 2023-10-08 MED ORDER — PHENYLEPHRINE 80 MCG/ML (10ML) SYRINGE FOR IV PUSH (FOR BLOOD PRESSURE SUPPORT)
PREFILLED_SYRINGE | INTRAVENOUS | Status: DC | PRN
  Administered 2023-10-08: 160 ug via INTRAVENOUS

## 2023-10-08 MED ORDER — ACETAMINOPHEN 10 MG/ML IV SOLN
INTRAVENOUS | Status: DC | PRN
  Administered 2023-10-08: 1000 mg via INTRAVENOUS

## 2023-10-08 MED ORDER — HYDROCODONE-ACETAMINOPHEN 5-325 MG PO TABS: 1.0000 | ORAL_TABLET | Freq: Four times a day (QID) | ORAL | 0 refills | Status: AC | PRN

## 2023-10-08 MED ORDER — PROPOFOL 10 MG/ML IV BOLUS
INTRAVENOUS | Status: DC | PRN
  Administered 2023-10-08: 80 mg via INTRAVENOUS

## 2023-10-08 MED ORDER — DEXMEDETOMIDINE HCL IN NACL 200 MCG/50ML IV SOLN
INTRAVENOUS | Status: DC | PRN
  Administered 2023-10-08: 4 ug via INTRAVENOUS

## 2023-10-08 MED ORDER — ACETAMINOPHEN 10 MG/ML IV SOLN
INTRAVENOUS | Status: AC
  Filled 2023-10-08: qty 100

## 2023-10-08 MED ORDER — OXYCODONE HCL 5 MG/5ML PO SOLN: 5.0000 mg | Freq: Once | ORAL | Status: AC | PRN

## 2023-10-08 MED ORDER — PHENYLEPHRINE HCL-NACL 20-0.9 MG/250ML-% IV SOLN
INTRAVENOUS | Status: DC | PRN
  Administered 2023-10-08: 25 ug/min via INTRAVENOUS

## 2023-10-08 MED ORDER — PROPOFOL 1000 MG/100ML IV EMUL
INTRAVENOUS | Status: AC
Start: 2023-10-08 — End: 2023-10-08
  Filled 2023-10-08: qty 100

## 2023-10-08 MED ORDER — LIDOCAINE HCL (CARDIAC) PF 100 MG/5ML IV SOSY
PREFILLED_SYRINGE | INTRAVENOUS | Status: DC | PRN
  Administered 2023-10-08: 60 mg via INTRAVENOUS

## 2023-10-08 MED ORDER — FENTANYL CITRATE (PF) 100 MCG/2ML IJ SOLN
25.0000 ug | INTRAMUSCULAR | Status: DC | PRN
  Administered 2023-10-08 (×4): 25 ug via INTRAVENOUS

## 2023-10-08 MED ORDER — GLYCOPYRROLATE 0.2 MG/ML IJ SOLN
INTRAMUSCULAR | Status: DC | PRN
  Administered 2023-10-08: .2 mg via INTRAVENOUS

## 2023-10-08 MED ORDER — CEFAZOLIN SODIUM-DEXTROSE 2-4 GM/100ML-% IV SOLN
INTRAVENOUS | Status: AC
  Filled 2023-10-08: qty 100

## 2023-10-08 MED ORDER — CHLORHEXIDINE GLUCONATE 0.12 % MT SOLN
OROMUCOSAL | Status: AC
  Filled 2023-10-08: qty 15

## 2023-10-08 MED ORDER — ROCURONIUM BROMIDE 100 MG/10ML IV SOLN
INTRAVENOUS | Status: DC | PRN
  Administered 2023-10-08: 20 mg via INTRAVENOUS
  Administered 2023-10-08: 40 mg via INTRAVENOUS

## 2023-10-08 MED ORDER — OXYCODONE HCL 5 MG PO TABS
5.0000 mg | ORAL_TABLET | Freq: Once | ORAL | Status: AC | PRN
  Administered 2023-10-08: 5 mg via ORAL

## 2023-10-08 MED ORDER — MIDAZOLAM HCL 2 MG/2ML IJ SOLN
INTRAMUSCULAR | Status: AC
  Filled 2023-10-08: qty 2

## 2023-10-08 MED ORDER — DEXAMETHASONE SODIUM PHOSPHATE 10 MG/ML IJ SOLN
INTRAMUSCULAR | Status: DC | PRN
  Administered 2023-10-08: 5 mg via INTRAVENOUS

## 2023-10-08 MED ORDER — MIDAZOLAM HCL 2 MG/2ML IJ SOLN
INTRAMUSCULAR | Status: DC | PRN
  Administered 2023-10-08 (×2): 1 mg via INTRAVENOUS

## 2023-10-08 SURGICAL SUPPLY — 37 items
BAG PRESSURE INF REUSE 1000 (BAG) IMPLANT
COVER TIP SHEARS 8 DVNC (MISCELLANEOUS) ×1 IMPLANT
COVER WAND RF STERILE (DRAPES) ×1 IMPLANT
DERMABOND ADVANCED .7 DNX12 (GAUZE/BANDAGES/DRESSINGS) ×1 IMPLANT
DRAPE ARM DVNC X/XI (DISPOSABLE) ×3 IMPLANT
DRAPE COLUMN DVNC XI (DISPOSABLE) ×1 IMPLANT
ELECTRODE REM PT RTRN 9FT ADLT (ELECTROSURGICAL) ×1 IMPLANT
FORCEPS BPLR FENES DVNC XI (FORCEP) ×1 IMPLANT
GLOVE BIOGEL PI IND STRL 6.5 (GLOVE) ×2 IMPLANT
GLOVE SURG SYN 6.5 ES PF (GLOVE) ×4 IMPLANT
GLOVE SURG SYN 6.5 PF PI (GLOVE) ×2 IMPLANT
GOWN STRL REUS W/ TWL LRG LVL3 (GOWN DISPOSABLE) ×4 IMPLANT
IRRIGATOR SUCT 8 DISP DVNC XI (IRRIGATION / IRRIGATOR) IMPLANT
IV CATH ANGIO 12GX3 LT BLUE (NEEDLE) IMPLANT
IV NS 1000ML BAXH (IV SOLUTION) IMPLANT
KIT PINK PAD W/HEAD ARM REST (MISCELLANEOUS) ×1 IMPLANT
LABEL OR SOLS (LABEL) IMPLANT
MANIFOLD NEPTUNE II (INSTRUMENTS) ×1 IMPLANT
MESH 3DMAX MID 4X6 RT LRG (Mesh General) IMPLANT
NDL DRIVE SUT CUT DVNC (INSTRUMENTS) ×1 IMPLANT
NDL HYPO 22X1.5 SAFETY MO (MISCELLANEOUS) ×1 IMPLANT
NDL INSUFFLATION 14GA 120MM (NEEDLE) ×1 IMPLANT
NEEDLE DRIVE SUT CUT DVNC (INSTRUMENTS) ×1 IMPLANT
NEEDLE HYPO 22X1.5 SAFETY MO (MISCELLANEOUS) ×1 IMPLANT
NEEDLE INSUFFLATION 14GA 120MM (NEEDLE) ×1 IMPLANT
OBTURATOR OPTICALSTD 8 DVNC (TROCAR) ×1 IMPLANT
PACK LAP CHOLECYSTECTOMY (MISCELLANEOUS) ×1 IMPLANT
SCISSORS MNPLR CVD DVNC XI (INSTRUMENTS) ×1 IMPLANT
SEAL UNIV 5-12 XI (MISCELLANEOUS) ×3 IMPLANT
SET TUBE SMOKE EVAC HIGH FLOW (TUBING) ×1 IMPLANT
SOLUTION ELECTROSURG ANTI STCK (MISCELLANEOUS) ×1 IMPLANT
SUT STRATA 2-0 23CM CT-2 (SUTURE) ×1 IMPLANT
SUT VIC AB 2-0 SH 27XBRD (SUTURE) ×1 IMPLANT
SUT VIC AB 2-0 UR6 27 (SUTURE) IMPLANT
SUTURE MNCRL 4-0 27XMF (SUTURE) ×1 IMPLANT
TAPE TRANSPORE STRL 2 31045 (GAUZE/BANDAGES/DRESSINGS) IMPLANT
WATER STERILE IRR 500ML POUR (IV SOLUTION) ×1 IMPLANT

## 2023-10-08 NOTE — Interval H&P Note (Signed)
 History and Physical Interval Note:  10/08/2023 7:07 AM  Katie Franklin  has presented today for surgery, with the diagnosis of K40.90 non recurrent inguinal hernia w/o obstruction or gangrene.  The various methods of treatment have been discussed with the patient and family. After consideration of risks, benefits and other options for treatment, the patient has consented to  Procedure(s): REPAIR, HERNIA, INGUINAL, BILATERAL, ROBOT-ASSISTED (Bilateral) as a surgical intervention.  The patient's history has been reviewed, patient examined, no change in status, stable for surgery.  I have reviewed the patient's chart and labs.  Questions were answered to the patient's satisfaction.     Lucas Sjogren

## 2023-10-08 NOTE — Anesthesia Postprocedure Evaluation (Signed)
 Anesthesia Post Note  Patient: [name]  Procedure(s) Performed: REPAIR, HERNIA, INGUINAL, RIGHT, ROBOT-ASSISTED (Right: Inguinal)  Patient location during evaluation: PACU Anesthesia Type: General Level of consciousness: awake and alert Pain management: pain level controlled Vital Signs Assessment: post-procedure vital signs reviewed and stable Respiratory status: spontaneous breathing, nonlabored ventilation and respiratory function stable Cardiovascular status: blood pressure returned to baseline and stable Postop Assessment: no apparent nausea or vomiting Anesthetic complications: no   No notable events documented.   Last Vitals:  Vitals:   10/08/23 1033 10/08/23 1132  BP: 126/82 131/83  Pulse: (!) 54 (!) 54  Resp: 17 17  Temp: (!) 36.2 C (!) 36.3 C  SpO2: 100% 100%    Last Pain:  Vitals:   10/08/23 1132  TempSrc: Temporal  PainSc: 4                  Fairy POUR [name]

## 2023-10-08 NOTE — Op Note (Signed)
 Preoperative diagnosis: Right inguinal hernia.   Postoperative diagnosis: Right inguinal hernia.  Procedure: Robotic assisted Laparoscopic Transabdominal preperitoneal laparoscopic (TAPP) repair of right inguinal hernia.  Anesthesia: GETA  Surgeon: Dr. Cesar Coe  Wound Classification: Clean  Indications:  Patient is a 68 y.o. female developed a symptomatic right inguinal hernia. Repair was indicated.  Findings: 1. Right direct Inguinal hernia identified 2. Vas deferens and cord structures identified and preserved 3. Bard Large 3D Max MID Anatomical mesh used for repair 4. Adequate hemostasis.   Picture 1-3: Multiple direct small defects Picture 4: Right groin after mesh placement and flap closure Picture 5: Left groin, no hernia identified.              Description of procedure:  The patient was taken to the operating room and the correct side of surgery was verified. The patient was placed supine with right arm tucked at the side. After obtaining adequate anesthesia, the patient's abdomen was prepped and draped in standard sterile fashion. A time-out was completed verifying correct patient, procedure, site, positioning, and implant(s) and/or special equipment prior to beginning this procedure.  An incision was made in a natural skin line above the umbilicus. The fascia was elevated and the Veress needle inserted. Proper position was confirmed by aspiration and saline meniscus test.  The abdomen was insufflated with carbon dioxide to a pressure of 15 mmHg. The patient tolerated insufflation well.  Abdominal cavity was entered using Optiview technique with a millimeter trocar.  No injury was identified.  Another 2 mm trocars were placed lateral to each rectus muscle.  Scissors and bipolar forceps were inserted under direct visualization. At the robotic console: Transverse peritoneal incision is made about 8 cm superior to the inguinal defect on the right side. Medial to the  epigastric vessels, the parietal compartment is dissected to visualize the rectus muscle. This is carried down to the symphysis pubis and the retropubic space is dissected to expose at least 2 cm contralateral to the midline. Cooper's ligament is exposed and cleared at least 2 cm below the ligament to ensure adequate space for the inferior border of the mesh. Hesselbach's triangle is cleared assessing for a direct hernia. The hernia is reduced dissecting the contents away from the border of the transversalis (white) fascia. Lateral to the epigastric vessels, the dissection is carried out in visceral compartment continuing in the true preperitoneal plane.This dissection was continued until the cord structures are "parietalized" completely, allowing for visualization of the reflected peritoneum that is continuous with the line originating 2 cm below Coopers medially and across the psoas muscle in the lateral compartment.  The internal ring was interrogated for a cord lipoma. The cord lipoma was reduced to the retroperitoneum and seated dorsal to the preperitoneal mesh. Having achieved a complete dissection with a critical view of the entire myopectineal orifice, a large mesh was then positioned centered at the iliopubic tract with the medial side crossing the midline and the inferior edge positioned 2 cm below Coopers ligament. The lateral aspect of the mesh extended 3-5 cm beyond the lateral edge of the psoas. The mesh is fixated using an interrupted suture placed to the ipsilateral Coopers ligament. A second suture was done at the medial superior aspect of the mesh fixating this to the rectus complex.  The peritoneal flap is closed with running barbed suture. Additional holes in the peritoneum closed with suture. Preperitoneal space gas aspirated to visualize the peritoneum apposed directly against the mesh and ensure no folding,  lifting, or buckling of the mesh. Skin is closed, sterile dressings are applied.   The patient tolerated the procedure well and was taken to the postanesthesia care unit in stable condition  Specimen: None  Complications: None  Estimated Blood Loss: 5 mL

## 2023-10-08 NOTE — Anesthesia Procedure Notes (Signed)
 Procedure Name: Intubation Date/Time: 10/08/2023 7:39 AM  Performed by: Ledora Duncan, CRNAPre-anesthesia Checklist: Patient identified, Emergency Drugs available, Suction available and Patient being monitored Patient Re-evaluated:Patient Re-evaluated prior to induction Oxygen Delivery Method: Circle system utilized Preoxygenation: Pre-oxygenation with 100% oxygen Induction Type: IV induction Ventilation: Mask ventilation without difficulty Laryngoscope Size: McGrath and 3 Grade View: Grade I Tube type: Oral Number of attempts: 1 Airway Equipment and Method: Stylet Placement Confirmation: ETT inserted through vocal cords under direct vision, positive ETCO2 and breath sounds checked- equal and bilateral Secured at: 20 cm Tube secured with: Tape Dental Injury: Teeth and Oropharynx as per pre-operative assessment

## 2023-10-08 NOTE — Transfer of Care (Signed)
 Immediate Anesthesia Transfer of Care Note  Patient: Katie Franklin  Procedure(s) Performed: REPAIR, HERNIA, INGUINAL, RIGHT, ROBOT-ASSISTED (Right: Inguinal)  Patient Location: PACU  Anesthesia Type:General  Level of Consciousness: drowsy and patient cooperative  Airway & Oxygen Therapy: Patient Spontanous Breathing and Patient connected to face mask oxygen  Post-op Assessment: Report given to RN and Post -op Vital signs reviewed and stable  Post vital signs: Reviewed and stable  Last Vitals:  Vitals Value Taken Time  BP 125/69 10/08/23 09:11  Temp 36.7 C 10/08/23 09:11  Pulse 79 10/08/23 09:14  Resp 21 10/08/23 09:14  SpO2 100 % 10/08/23 09:14  Vitals shown include unfiled device data.  Last Pain:  Vitals:   10/08/23 0911  TempSrc:   PainSc: Asleep      Patients Stated Pain Goal: 2 (10/08/23 0622)  Complications: No notable events documented.

## 2023-10-08 NOTE — Discharge Instructions (Signed)

## 2023-10-08 NOTE — Anesthesia Preprocedure Evaluation (Signed)
 Anesthesia Evaluation  Patient identified by MRN, date of birth, ID band Patient awake    Reviewed: Allergy & Precautions, NPO status , Patient's Chart, lab work & pertinent test results  History of Anesthesia Complications Negative for: history of anesthetic complications  Airway Mallampati: III  TM Distance: <3 FB Neck ROM: full    Dental  (+) Chipped, Poor Dentition, Missing   Pulmonary neg shortness of breath, COPD, Current Smoker and Patient abstained from smoking.   Pulmonary exam normal        Cardiovascular Exercise Tolerance: Good hypertension, (-) angina + CAD  (-) Past MI and (-) DOE Normal cardiovascular exam     Neuro/Psych  PSYCHIATRIC DISORDERS      negative neurological ROS     GI/Hepatic Neg liver ROS,GERD  Controlled,,  Endo/Other   Hyperthyroidism   Renal/GU      Musculoskeletal   Abdominal   Peds  Hematology negative hematology ROS (+)   Anesthesia Other Findings Past Medical History: No date: Arthritis 2012: Breast cancer, left (HCC) No date: COPD (chronic obstructive pulmonary disease) (HCC) No date: Deafness in left ear No date: Depression No date: Diverticulosis 2012: Ductal carcinoma of left breast (HCC) No date: Family history of adverse reaction to anesthesia     Comment:  a.) PONV in 1st degree female relative (mother) No date: Gastritis No date: GERD (gastroesophageal reflux disease) No date: H. pylori infection No date: H/O herpes labialis No date: Hemorrhoids No date: History of chicken pox No date: Hyperplastic colon polyp No date: Hypertension No date: Hyperthyroidism 08/2017: Lung cancer (HCC)     Comment:  surgery only, no rad or chemo tx No date: Multinodular goiter (nontoxic) No date: Osteoporosis No date: Panic disorder No date: Personal history of radiation therapy No date: Recurrent sinusitis No date: Tobacco use No date: Tubular adenoma of colon  Past  Surgical History: 2018: BACK SURGERY     Comment:  lumbar No date: BREAST EXCISIONAL BIOPSY; Left     Comment:  negative 1980's twice 2012: BREAST EXCISIONAL BIOPSY; Left     Comment:  [place] 2012: BREAST LUMPECTOMY; Left     Comment:  [facility], clear margins, LN neg No date: BUNIONECTOMY; Right 06/11/2017: CHOLECYSTECTOMY; N/A     Comment:  Procedure: LAPAROSCOPIC CHOLECYSTECTOMY;  Surgeon:               Rodolph Romano, MD;  Location: [facility] ORS;  Service:              General;  Laterality: N/A; 05/17/2021: COLONOSCOPY; N/A     Comment:  Procedure: COLONOSCOPY;  Surgeon: Maryruth Ole DASEN,               MD;  Location: [facility] ENDOSCOPY;  Service: Endoscopy;                Laterality: N/A; 12/28/2014: COLONOSCOPY WITH PROPOFOL ; N/A     Comment:  Procedure: COLONOSCOPY WITH PROPOFOL ;  Surgeon: Gladis RAYMOND Mariner, MD;  Location: [facility] ENDOSCOPY;  Service:               Endoscopy;  Laterality: N/A; 08/17/2017: COLONOSCOPY WITH PROPOFOL ; N/A     Comment:  Procedure: COLONOSCOPY WITH PROPOFOL ;  Surgeon:               Mariner Gladis RAYMOND, MD;  Location: [facility] ENDOSCOPY;  Service: Endoscopy;  Laterality: N/A; 08/14/2022: COLONOSCOPY WITH PROPOFOL ; N/A     Comment:  Procedure: COLONOSCOPY WITH PROPOFOL ;  Surgeon:               Maryruth Ole DASEN, MD;  Location: [facility] ENDOSCOPY;                Service: Endoscopy;  Laterality: N/A; No date: cyst throat     Comment:  base of tongue No date: ESOPHAGOGASTRODUODENOSCOPY 04/13/2020: ESOPHAGOGASTRODUODENOSCOPY; N/A     Comment:  Procedure: ESOPHAGOGASTRODUODENOSCOPY (EGD);  Surgeon:               Maryruth Ole DASEN, MD;  Location: [facility] ENDOSCOPY;                Service: Endoscopy;  Laterality: N/A; 12/02/2015: ESOPHAGOGASTRODUODENOSCOPY (EGD) WITH PROPOFOL ; N/A     Comment:  Procedure: ESOPHAGOGASTRODUODENOSCOPY (EGD) WITH               PROPOFOL ;  Surgeon: Gladis RAYMOND Mariner, MD;  Location:               [facility] ENDOSCOPY;   Service: Endoscopy;  Laterality: N/A; 04/13/2017: ESOPHAGOGASTRODUODENOSCOPY (EGD) WITH PROPOFOL ; N/A     Comment:  Procedure: ESOPHAGOGASTRODUODENOSCOPY (EGD) WITH               PROPOFOL ;  Surgeon: Mariner Gladis RAYMOND, MD;  Location:               [facility] ENDOSCOPY;  Service: Endoscopy;  Laterality: N/A; 08/27/2019: EYE SURGERY     Comment:  cataract extraction right 09/10/2019: EYE SURGERY     Comment:  cataract extraction left 08/27/2017: FLEXIBLE BRONCHOSCOPY; N/A     Comment:  Procedure: PREOP BRONCHOSCOPY;  Surgeon: Volney Lye,               MD;  Location: [facility] ORS;  Service: Thoracic;  Laterality:              N/A; No date: GANGLION CYST EXCISION; Right     Comment:  wrist No date: Incision tendon sheath for trigger finger; Left 12/11/2021: INTRAMEDULLARY (IM) NAIL INTERTROCHANTERIC; Right     Comment:  Procedure: INTRAMEDULLARY (IM) NAIL INTERTROCHANTERIC;                Surgeon: Cleotilde Barrio, MD;  Location: [facility] ORS;                Service: Orthopedics;  Laterality: Right; 11/06/2016: LUMBAR LAMINECTOMY/DECOMPRESSION MICRODISCECTOMY; Right     Comment:  Procedure: RIGHT L5/S1 HEMILAMINECTOMY AND CYST               RESECTION L5/S1;  Surgeon: Bluford Standing, MD;  Location:               [facility] ORS;  Service: Neurosurgery;  Laterality: Right; 07/2010: MASTECTOMY     Comment:  partial mastectomy left No date: MULTIPLE TOOTH EXTRACTIONS; N/A     Comment:  9 teeth No date: NASAL SEPTUM SURGERY 08/27/2017: THORACOTOMY; Left     Comment:  Procedure: THORACOTOMY MAJOR;  Surgeon: Volney Lye,               MD;  Location: [facility] ORS;  Service: Thoracic;  Laterality:              Left; No date: TONSILLECTOMY No date: TRIGGER FINGER RELEASE; Left  BMI    Body Mass Index: 17.31 kg/m      Reproductive/Obstetrics negative OB ROS  Anesthesia Physical Anesthesia Plan  ASA: 3  Anesthesia Plan: General ETT   Post-op Pain  Management:    Induction: Intravenous  PONV Risk Score and Plan: Ondansetron , Dexamethasone , Midazolam  and Treatment may vary due to age or medical condition  Airway Management Planned: Oral ETT  Additional Equipment:   Intra-op Plan:   Post-operative Plan: Extubation in OR  Informed Consent: I have reviewed the patients History and Physical, chart, labs and discussed the procedure including the risks, benefits and alternatives for the proposed anesthesia with the patient or authorized representative who has indicated his/her understanding and acceptance.     Dental Advisory Given  Plan Discussed with: Anesthesiologist, CRNA and Surgeon  Anesthesia Plan Comments: (Patient consented for risks of anesthesia including but not limited to:  - adverse reactions to medications - damage to eyes, teeth, lips or other oral mucosa - nerve damage due to positioning  - sore throat or hoarseness - Damage to heart, brain, nerves, lungs, other parts of body or loss of life  Patient voiced understanding and assent.)       Anesthesia Quick Evaluation

## 2023-10-09 ENCOUNTER — Encounter: Payer: Self-pay | Admitting: General Surgery

## 2023-10-25 NOTE — Progress Notes (Signed)
 HPI The patient is a 68 year old female who presents today for follow-up of acute on chronic low back pain with radiation into the right anterior lateral thigh, anterior shin, and dorsum of the foot.  Her pain initially began in 2006 without preceding injury or trauma.  Her pain is worsened with standing, walking, sitting, lying down, and housework.  On 11/06/2016 she underwent a right L5-S1 hemilaminectomy, discectomy, facetectomy, and foraminotomy by Dr. Bluford.  She continues to have pain that radiates into the right anterior lateral aspect of the thigh, anterior lateral aspect of the lower leg, and into the dorsum of the foot.  She has had persistent L4 and L5 radicular symptoms.  Previously physical therapy caused her symptoms to worsen.  Medications have included a prednisone taper (minimal relief), Tylenol  (minimal relief), NSAIDs (discontinued due to gastritis and H. pylori infection), gabapentin  300 mg 3 times daily (minimal relief, nausea), Lyrica , Flexeril (no relief), and tramadol  (mild to moderate relief).  On 06/25/18 she was evaluated by Dr. Bluford in neurosurgery. He recommended facet joint injections and recommended against surgery.  On 11/28/2018 EMG with Dr. Maree was essentially negative.  MRI of the lumbar spine on 12/30/2022 demonstrated at L5-S1 severe bilateral facet arthropathy without neural impingement.  At L4-5 there is moderate facet arthropathy with mild lateral recess stenosis.    She was last evaluated on 09/13/2023 at which time she continued with Tylenol  500 mg 1 to 2 tablets twice daily, gabapentin  100 mg 3 times daily, tramadol  50 mg 1/2 tablet sparingly for breakthrough pain.  She had 60% relief following a right L4-5 and right L5-S1 transforaminal ESI (dexamethasone  13 mg).  Her greatest concern was a bulge in the right groin and pain associated with this.  X-rays of the right hip demonstrated status post right hip replacement without complications.  She was found to have  a inguinal hernia and underwent right inguinal hernia repair by Dr. Cesar Coe on 10/08/2023.  She is 2-1/2 weeks out from surgery and continues to have some swelling and discomfort.  He told her that this recovery will take approximately 6 weeks.  Hydrocodone  was not tolerated due to insomnia.  She continues with tramadol  1/2 tablet along with Zofran  as needed.  She would like to see how she does over the next 2 months.  She continues with tramadol  50 mg 1/2 tablet with Zofran .  This is helpful for her pain and much better tolerated.  She would like a refill on these.  She has had some difficulty raising her right hand overhead and has some symptoms consistent with recurrent impingement syndrome.  We will monitor this for now.  In addition she has had problems with her feet.  She has seen podiatry and been diagnosed with plantar fasciitis.  Shoe inserts have not been beneficial.    On 09/07/2021 EMG by Dr. Maree was consistent with peripheral neuropathy.  She was been started on Lyrica  25 mg twice daily (no relief).  Procedures: 08/21/2023: Right L4-5 and right L5-S1 transforaminal ESI (50-60% relief, dexamethasone  13 mg) 07/09/2023: Right L5-S1 and right S1 transforaminal ESI (good relief for 1 week, then some return of pain) 02/27/2023: RFA to the right L4-5 and L5-S1 facet joints (over 90% relief) 02/12/2023: MBB to the right L4-5 and L5-S1 facet joints (10/10 to 2/10) 01/23/2023: MBB to the right L4-5 and L5-S1 facet joints (10/10 to 1-2/10) 10/24/2021: Right C5-6 transforaminal ESI (moderate relief) 10/10/2021: Right L5-S1 and right S1 transforaminal ESI (moderate relief) 10/15/2019: Right L5-S1  and right S1 transforaminal ESI (moderate to good relief) 08/15/2019: Right C5-6 transforaminal ESI (good relief) 07/21/2019: Right L5-S1 and right S1 transforaminal ESI (moderate relief) 07/04/2019: Right L5-S1 and right S1 transforaminal ESI (moderate to good relief) 04/03/2019: Right C5-6  transforaminal ESI (at least 3 weeks of good relief) 03/20/2019: Right hip joint injection (90% relief) 01/09/2019: Right L4-5 and L5-S1 facet joint injections (after 2 weeks she had approximately 2 weeks of relief) 05/20/2018: Right L4-5 transforaminal ESI (20% relief x10 days) 04/30/2018: Right C5-6 transforaminal ESI (moderate to good relief for 2 weeks) 03/28/2018: Right L4-5 transforaminal ESI (moderate to good relief for 1 to 2 weeks) 02/19/2018: Right C5-6 transforaminal ESI (moderate relief) 01/18/2018: Right L4-5 transforaminal ESI (moderate to good relief) 12/14/2017: Left C5-6 transforaminal ESI (moderate to good relief) 11/13/2017: Right L4-5 transforaminal ESI (moderate to good relief) 07/17/2017: Right L4-5 transforaminal ESI (good relief) 04/19/2017: Right L4-5 transforaminal ESI (good relief) 03/01/2017: Right SI joint injection  11/06/16: Right L5-S1 hemilaminectomy and discectomy with facetectomy and foraminotomy (Dr. Bluford)  09/14/2016: Right L5-S1 and right S1 transforaminal ESI (relief x10 days)   Past Medical History:  Diagnosis Date  . Allergy    latex allergy  . Arthritis   . Breast cancer (CMS-HCC)    Status post lumpectomy, radiation, hormone therapy  . COPD (chronic obstructive pulmonary disease) (CMS-HCC) 07/22/2014  . Deafness in left ear   . Depression   . Diverticulosis 12/28/2014  . Emphysema of lung (CMS/HHS-HCC)   . Gastritis   . GERD (gastroesophageal reflux disease)   . H. pylori infection 12/02/2015  . Herpes labialis   . History of chicken pox   . HTN (hypertension)   . Hyperlipidemia   . Hyperplastic colon polyp 11/07/2011  . Internal hemorrhoids 12/28/2014  . Multinodular goiter (nontoxic)   . Osteoporosis    a. Foot and rib fractures.  b. S/p Fosamax.  c. S/p Forteo.  [facility])  . Panic disorder   . Recurrent sinusitis   . Tobacco use   . Tubular adenoma of colon 12/28/2014   10/27/2008    Past Surgical History:  Procedure  Laterality Date  . LEFT BREAST CYST REMOVED Left 1980   Dr. Sankar  . LEFT BREAST CYST REMOVED Left 1980   Dr. Lorrene  . LEFT BREAST CYST REMOVED Left 1982   [facility]  . TONSILLECTOMY  1995   Dr. Paul Juengel  . BUNIONETTE EXCISION Right 2000   Dr. Donnice Cory  . SEPTOPLASTY  2004   Dr. Carolee Vault  . Cysts removed from backside of tongue Left 2007   Dr. Wilmer Hasten  . INCISION TENDON SHEATH FOR TRIGGER FINGER Left 2010   Dr. Medford Sharps  . EXCISION GANGLION CYST WRIST PRIMARY Right 2010   Dr. Medford Sharps  . MASTECTOMY PARTIAL Left 07/2010   s/p radiation and chemo  . BREAST BIOPSY Left 08/01/2010   Dr. Charlie Fell; cancer 8mm  . PARTIAL MASTECTOMY, REMOVE LEFT SENTINAL NODE Left 08/11/2010   Dr. Charlie Cooepr (invasive mammary carcinoma, lumpectomy with sentinel lymph node sampling)  . EGD  10/22/2012  . COLONOSCOPY  12/28/2014   Tubular adenoma of colon/Repeat 67yrs/MUS  . EGD  12/02/2015   H. pylori infection/Gastritis/Reflux esophagitis/No Repeat/MUS  . Right L5-S1 hemilaminectomy  11/06/2016   Dr Elspeth Bluford  . EGD  04/13/2017   GERD/Negative for H. Pylori/No Repeat/MUS  . CHOLECYSTECTOMY  06/11/2017   Dr Lucas Catchings  . COLONOSCOPY  08/17/2017   Diverticulosis/No specimens collected/FHx CP-Mother/Repeat 67yrs/MUS  .  CATARACT EXTRACTION Right 08/27/2019  . CATARACT EXTRACTION Left 09/10/2019  . EGD  04/13/2020   Gastritis/Repeat as needed/CTL  . COLONOSCOPY  05/17/2021   Tubular adenoma/Poor colon prep/Repeat 6 months/CTL  . Colon @ [facility]  08/14/2022   Colonoscopy with small TA. Repeat colonoscopy in 7 years/CTL  . 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 EGD @ [facility]  05/16/2023   Hyperplastic gastric polyp >68mm/Repeat 56yr/SMR  . ROBOT ASSISTED LAPAROSCOPIC REPAIR INGUINAL HERNIA Right 10/08/2023   Dr Lucas Catchings  . back surgery    . COLONOSCOPY  11/07/2011, 10/27/2008   Hyperplastic polyp/ PHx of Tubular adenoma  .  FRACTURE SURGERY  11-2021  . PARTIAL MASTECTOMY Left     Social History   Socioeconomic History  . Marital status: Married    Spouse name: Joen Dolly  . Number of children: 0  . Years of education: 22  . Highest education level: GED or equivalent  Occupational History  . Occupation: Retired  Tobacco Use  . Smoking status: Former    Current packs/day: 0.00    Types: Cigarettes    Quit date: 2019    Years since quitting: 6.5  . Smokeless tobacco: Never  Vaping Use  . Vaping status: Never Used  Substance and Sexual Activity  . Alcohol  use: No    Alcohol /week: 0.0 standard drinks of alcohol   . Drug use: Never  . Sexual activity: Yes    Partners: Female  Social History Narrative   Lives with partner   Tobacco 1ppd   Laid off in 2008 from [facility], now retired   Conservator, museum/gallery  - none   Social Drivers of Corporate investment banker Strain: Low Risk  (09/27/2023)   Overall Financial Resource Strain (CARDIA)   . Difficulty of Paying Living Expenses: Not hard at all  Food Insecurity: No Food Insecurity (09/27/2023)   Hunger Vital Sign   . Worried About Programme researcher, broadcasting/film/video in the Last Year: Never true   . Ran Out of Food in the Last Year: Never true  Transportation Needs: No Transportation Needs (09/27/2023)   PRAPARE - Transportation   . Lack of Transportation (Medical): No   . Lack of Transportation (Non-Medical): No    Current Outpatient Medications on File Prior to Visit  Medication Sig Dispense Refill  . acetaminophen  (TYLENOL ) 500 MG tablet Take 1,000 mg by mouth once daily as needed       . alpha lipoic acid 600 mg Cap capsule Take 1 capsule by mouth 2 (two) times daily    . ALPRAZolam  (XANAX ) 0.5 MG tablet Take 1 tablet (0.5 mg total) by mouth 2 (two) times daily as needed for Anxiety 60 tablet 3  . atorvastatin  (LIPITOR) 10 MG tablet Take 1 tablet by mouth once daily    . b complex vitamins capsule Take 1 capsule by mouth once daily    . CALCIUM -VITAMIN D3 ORAL Take 1  tablet by mouth once daily       . cholecalciferol, vitamin D3, (VITAMIN D3) 125 mcg (5,000 unit) tablet Take 5,000 Units by mouth once daily    . cyanocobalamin  (VITAMIN B12) 1000 MCG tablet Take 1,000 mcg by mouth once daily.    SABRA denosumab (PROLIA) 60 mg/mL inj syringe TO BE ADMINISTERED IN PHYSICIAN'S OFFICE. INJECT ONE SYRINGE SUBCUTANEOUSLY ONCE EVERY 6 MONTHS. REFRIGERATE. USE WITHIN 14 DAYS ONCE AT ROOM TEMPERATURE. (Patient not taking: Reported on 10/25/2023) 1 mL 0  . denosumab (PROLIA)  60 mg/mL inj syringe INJECT 1 SYRINGE UNDER THE SKIN ONCE EVERY 6 MONTHS 1 mL 0  . diphenhydrAMINE (BENADRYL) 50 MG capsule Take 1 capsule (50 mg total) by mouth as directed for Allergies 1 capsule 0  . ezetimibe  (ZETIA ) 10 mg tablet Take 10 mg by mouth once daily    . IDS Study Medication Take 1 each by mouth once daily Daily relief    . levalbuterol (XOPENEX HFA) inhaler Inhale 2 inhalations into the lungs every 4 (four) hours as needed for Wheezing 45 g 3  . loteprednol (LOTEMAX) 0.5 % ophthalmic suspension     . methIMAzole (TAPAZOLE) 5 MG tablet Take 1/2 (one-half) tablet by mouth once daily 45 tablet 0  . mirtazapine (REMERON) 15 MG tablet Take 1 tablet (15 mg total) by mouth at bedtime (Patient not taking: Reported on 09/27/2023) 30 tablet 11  . ondansetron  (ZOFRAN -ODT) 4 MG disintegrating tablet 1 po bid prn 30 tablet 0  . pantoprazole  (PROTONIX ) 40 MG DR tablet Take 1 tablet (40 mg total) by mouth every morning before breakfast 90 tablet 2  . peg 400-propylene glycol, PF, (SYSTANE ULTRA) 0.4-0.3 % ophthalmic drops Place 1 drop into both eyes as needed for Dry Eyes    . polyethylene glycol (MIRALAX ) powder Take 17 g by mouth once daily as needed    . predniSONE (DELTASONE) 50 MG tablet Take 1 tablet (50 mg total) by mouth as directed 3 tablet 0  . pregabalin  (LYRICA ) 25 MG capsule Take 1 capsule (25 mg total) by mouth 2 (two) times daily 60 capsule 5  . spironolactone  (ALDACTONE ) 25 MG tablet Take 25  mg by mouth once daily    . traMADoL  (ULTRAM ) 50 mg tablet 1/2-1 po bid prn 60 tablet 2  . vitamin E  400 UNIT capsule Take 400 Units by mouth once daily     No current facility-administered medications on file prior to visit.    Allergies as of 10/25/2023 - Reviewed 10/25/2023  Allergen Reaction Noted  . Sulfasalazine Anaphylaxis 12/25/2014  . Aspirin Abdominal Pain 04/09/2019  . Erythromycin Hives and Other (See Comments) 05/05/2014  . Latex Hives 05/05/2014  . Sulfa (sulfonamide antibiotics) Swelling 05/05/2014  . Sulfamethoxazole-trimethoprim Hives, Swelling, and Other (See Comments) 06/27/2014  . Tetracycline Hives and Other (See Comments) 05/05/2014  . Demeclocycline Other (See Comments) 12/25/2014  . Iodinated contrast media Swelling 05/08/2022  . Amoxicillin Other (See Comments) and Rash 12/11/2021    ROS More than 10 system, review of system form was given to the patient to fill out and has been signed by Dr. Avanell and scanned into the patient's chart.   Vital signs Vitals:   10/25/23 1440  BP: 112/71  Pulse: 69  Temp: 36.7 C (98 F)  TempSrc: Oral  Weight: 46.3 kg (102 lb 1.2 oz)  Height: 165.1 cm (5' 5)  PainSc:   5  PainLoc: Back    Exam Cervical Exam (performed 01/14/2020) She has a positive Spurling's maneuver for pain radiating into the brachial region.  Upper Extremity Exam She has 5/5 strength of bilateral wrist extensors, biceps, triceps, deltoids, shoulder internal rotators, and shoulder external rotators.  She has mild restricted right shoulder abduction and external rotation.  She has a positive right greater than left Vonzell Berber and empty can test.  Lumbosacral Exam (performed 06/17/2018) On inspection a well-healed surgical scar is noted.  On palpation she has moderate tenderness of the right lumbosacral paraspinal musculature.  Lumbar extension with rotation produces moderate right greater  than left lumbosacral pain.  Lower Extremity  Exam She has 5/5 strength in bilateral dorsiflexors, knee extensors, and hip flexors.  Sensation light touch is impaired diffusely throughout the right lower extremity.  It is intact in the left lower extremity.  She has symmetrical 2+ patellar and Achilles reflexes.  She does not have Babinski sign or ankle clonus.  Straight leg raise on the right produces low back pain with radiation into the posterior thigh.  Straight leg raise on the left produces right-sided low back pain.  Range of motion of the right hip produces mild to moderate groin pain and right-sided low back pain.  Range of motion of the left hip does not produce pain.   Radiographic Data X-rays of the right hip including an AP of the pelvis and unilateral right hip were obtained in the office today.  The right and left hip joint spaces appear well-preserved bilaterally.  She is status post open reduction internal fixation of the right hip.  Overread is pending.  MRI of the lumbar spine from [facility] dated 12/30/2022 both images and report reviewed today. COMPARISON:  MRI lumbar spine June 12, 2018.  FINDINGS:  Segmentation: Standard.  Alignment:  Physiologic.  Vertebrae:  No fracture, evidence of discitis, or bone lesion.  Conus medullaris and cauda equina: Conus extends to the L1 level.  Conus and cauda equina appear normal.  Paraspinal and other soft tissues: Negative.   Disc levels:  T12-L1: No significant disc protrusion, foraminal stenosis, or canal  stenosis.   L1-L2: No significant disc protrusion, foraminal stenosis, or canal  stenosis.   L2-L3: No significant disc protrusion, foraminal stenosis, or canal  stenosis.   L3-L4: Facet arthropathy and mild disc bulge. No significant  stenosis.   L4-L5: Mild disc bulge, ligamentum flavum thickening and facet  arthropathy. No significant stenosis.   L5-S1: Disc bulging and severe bilateral facet arthropathy. No  definite impingement or significant stenosis.  Postoperative changes  on the right posteriorly.   IMPRESSION:  Similar multilevel degenerative change including severe facet  arthropathy in the lower lumbar spine. No definite impingement or  significant stenosis.   Electronically Signed    By: Gilmore GORMAN Molt M.D.    On: 01/05/2023 10:49    Impression 1.  Acute on chronic low back pain with radiation into the right anterior and lateral thigh, anterior lateral lower leg, and dorsum of the foot.  Clinically she has symptoms consistent with an L4 and L5 polyradiculitis.  She has some symptoms consistent with degenerative joint disease of the hip.  She is status post right L5-S1 hemilaminectomy, discectomy, facetectomy, and foraminotomies by Dr. Bluford on 11/06/16.  X-rays of the right hip dated 09/13/2023 demonstrate well-preserved joint spaces bilaterally.  2.  Right sided low back/buttock pain.  Clinically she has symptoms consistent with facet mediated pain.  MRI demonstrates severe facet arthropathy at L5-S1 and moderate facet arthropathy at L4-5.  3.  Neck pain with radiation into the right greater than left upper extremities.  Clinically she has symptoms consistent with a cervical radiculitis.  [facility] from [facility] dated 03/28/2017 both images and report reviewed today.  The C2-3 and C3-4 levels are unremarkable.  At C4-5 there is mild disc desiccation and a small central protrusion.  There is no central or foraminal stenosis.  At C5-6 there is moderate disc desiccation and a moderate central protrusion.  There are bilateral uncovertebral degenerative changes with moderate to severe right foraminal stenosis and moderate left foraminal stenosis.  There  is mild central stenosis.  At C6-7 there is a small central protrusion without significant central or foraminal stenosis.  The C7-T1 level is unremarkable.  4.  Right shoulder pain consistent with rotator cuff tendinitis and impingement syndrome. 5.  Hypertension, panic disorder, depression,  deafness in left ear. 6.  COPD. 7.  Gastritis with H. pylori infection. 8.  Tobacco use. 9.  Osteoporosis. 10.  Breast cancer.   Plan 1.  Continue Tylenol  500 mg 1-2 tablets twice daily as needed. 2.  Continue tramadol  50 mg 1/2 to 1 tablet twice daily sparingly for breakthrough pain, #60, 2 refills.  She usually takes 1/2 tablet at a time.  She has been using Zofran  to assist with nausea associated with tramadol . 3.  She will follow-up with me in 2 months.    BENJAMIN CHARLES CHASNIS, DO  This note was generated in part with voice recognition software and I apologize for any typographical errors that were not detected and corrected.  PCP:  Dr. Alm Needle

## 2023-10-31 ENCOUNTER — Encounter: Payer: Self-pay | Admitting: Medical

## 2023-10-31 ENCOUNTER — Ambulatory Visit: Attending: Medical | Admitting: Medical

## 2023-10-31 VITALS — BP 110/60 | HR 65 | Ht 65.0 in | Wt 100.2 lb

## 2023-10-31 DIAGNOSIS — J432 Centrilobular emphysema: Secondary | ICD-10-CM | POA: Diagnosis not present

## 2023-10-31 DIAGNOSIS — E079 Disorder of thyroid, unspecified: Secondary | ICD-10-CM

## 2023-10-31 DIAGNOSIS — R634 Abnormal weight loss: Secondary | ICD-10-CM

## 2023-10-31 DIAGNOSIS — I25118 Atherosclerotic heart disease of native coronary artery with other forms of angina pectoris: Secondary | ICD-10-CM

## 2023-10-31 DIAGNOSIS — I700 Atherosclerosis of aorta: Secondary | ICD-10-CM

## 2023-10-31 DIAGNOSIS — I10 Essential (primary) hypertension: Secondary | ICD-10-CM

## 2023-10-31 DIAGNOSIS — Z87891 Personal history of nicotine dependence: Secondary | ICD-10-CM

## 2023-10-31 DIAGNOSIS — E782 Mixed hyperlipidemia: Secondary | ICD-10-CM

## 2023-10-31 NOTE — Patient Instructions (Signed)
 Medication Instructions:  Your physician recommends that you continue on your current medications as directed. Please refer to the Current Medication list given to you today.   *If you need a refill on your cardiac medications before your next appointment, please call your pharmacy*  Lab Work: No labs ordered today   Testing/Procedures: No test ordered today   Follow-Up: At [facility], you and your health needs are our priority.  As part of our continuing mission to provide you with exceptional heart care, our providers are all part of one team.  This team includes your primary Cardiologist (physician) and Advanced Practice Providers or APPs (Physician Assistants and Nurse Practitioners) who all work together to provide you with the care you need, when you need it.  Your next appointment:   1 year(s)  Provider:   Toribio Frees, PA-C

## 2023-10-31 NOTE — Progress Notes (Signed)
 Cardiology Office Note   Date:  10/31/2023  ID:  Katie Franklin, DOB 01-Feb-1956, MRN [id number] PCP: Epifanio Alm SQUIBB, MD  [facility] Providers Cardiologist:  None   History of Present Illness Katie Franklin is a 68 y.o. female with  a h/o tobacco use, COPD, left sided breast cancer treated with lumpectomy and radiation therapy, laparoscopic colecystectomy who presents for 1 year follow-up.   She had rash on Crestor . Livalo  was too expensive. CT scan 08/2019 showed significant coronary calcification and aortic atherosclerosis.   Patient was last seen 10/30/2022 and was stable from a cardiac perspective.   Today, the patient had hernia surgery last month and is still recovering. She talked to PCP about weight loss and thought it was thyroid . She saw endocrinology and is on medication now (methimazole). Appetite is lower than normal, but she is still trying to eat. She denies chest pain, SOB, lower leg edema. She has tinnitus and  ringing in her ears at times. No dizziness or lightheadedness. Before the hernia surgery she was fairly active.    Studies Reviewed EKG Interpretation Date/Time:  Wednesday October 31 2023 15:18:21 EDT Ventricular Rate:  65 PR Interval:  122 QRS Duration:  82 QT Interval:  402 QTC Calculation: 418 R Axis:   73  Text Interpretation: Normal sinus rhythm Nonspecific ST abnormality When compared with ECG of 03-Oct-2023 11:50, No significant change was found Confirmed by Franchester, Aaryn Parrilla ([id number]) on 10/31/2023 3:36:36 PM    ABIs 09/2020 Summary:  Right: Resting right ankle-brachial index is within normal range. No  evidence of significant right lower extremity arterial disease. The right  toe-brachial index is abnormal.   Left: Resting left ankle-brachial index is within normal range. No  evidence of significant left lower extremity arterial disease. The left  toe-brachial index is normal.   Heart monitor 2019 Event Monitor   Normal sinus rhythm   Avg  HR of 70 bpm.   6 Supraventricular Tachycardia/atrial tachycardia runs occurred, the run with the fastest interval lasting 9 beats with a max rate of 158 bpm   Isolated SVEs were rare (<1.0%), SVE Couplets were rare (<1.0%), and SVE Triplets were rare (<1.0%). Isolated VEs were rare (<1.0%), and no VE Couplets or VE Triplets were present.   Most of the patient triggered events were not associated with arrhythmia      Physical Exam VS:  BP 110/60 (BP Location: Left Arm, Patient Position: Sitting, Cuff Size: Normal)   Pulse 65   Ht 5' 5 (1.651 m)   Wt 100 lb 4 oz (45.5 kg)   SpO2 97%   BMI 16.68 kg/m        Wt Readings from Last 3 Encounters:  10/31/23 100 lb 4 oz (45.5 kg)  10/08/23 104 lb (47.2 kg)  05/14/23 102 lb 9.6 oz (46.5 kg)    GEN: Well nourished, well developed in no acute distress NECK: No JVD; No carotid bruits CARDIAC: RRR, no murmurs, rubs, gallops RESPIRATORY:  Clear to auscultation without rales, wheezing or rhonchi  ABDOMEN: Soft, non-tender, non-distended EXTREMITIES:  No edema; No deformity   ASSESSMENT AND PLAN  Aortic atherosclerosis 3V coronary artery calcification No anginal symptoms. She is fairly active at baseline. Continue Lipitor and Zetia   Emphysema Smoker Cessation recommended.  HTN BP is normal, cntinue spironolaconte 25mg  daily (she takes this for fluid in her ears)  Weight loss Thyroid  disease She is taking methimazole per endocrinology.  HLD LDL 70. Continue Lipitor and Zetia .  Dispo: Follow-up in 1 year  Signed, Zakhi Dupre VEAR Fishman, PA-C

## 2024-01-22 ENCOUNTER — Other Ambulatory Visit: Payer: Self-pay | Admitting: Cardiovascular Disease

## 2024-01-28 ENCOUNTER — Other Ambulatory Visit: Payer: Self-pay | Admitting: Cardiovascular Disease

## 2024-04-21 ENCOUNTER — Ambulatory Visit
Admission: RE | Admit: 2024-04-21 | Discharge: 2024-04-21 | Disposition: A | Discharge status: Home / Self Care | Source: Ambulatory Visit | Attending: Internal Medicine | Admitting: Internal Medicine

## 2024-04-21 DIAGNOSIS — Z1231 Encounter for screening mammogram for malignant neoplasm of breast: Secondary | ICD-10-CM | POA: Insufficient documentation

## 2024-04-21 DIAGNOSIS — C50919 Malignant neoplasm of unspecified site of unspecified female breast: Secondary | ICD-10-CM

## 2024-04-21 DIAGNOSIS — Z853 Personal history of malignant neoplasm of breast: Secondary | ICD-10-CM | POA: Insufficient documentation

## 2024-04-23 ENCOUNTER — Ambulatory Visit
Admission: RE | Admit: 2024-04-23 | Discharge: 2024-04-23 | Disposition: A | Discharge status: Home / Self Care | Attending: Internal Medicine | Admitting: Internal Medicine

## 2024-04-23 ENCOUNTER — Ambulatory Visit
Admission: RE | Admit: 2024-04-23 | Discharge: 2024-04-23 | Disposition: A | Discharge status: Home / Self Care | Source: Ambulatory Visit | Attending: Internal Medicine | Admitting: Internal Medicine

## 2024-04-23 DIAGNOSIS — C3412 Malignant neoplasm of upper lobe, left bronchus or lung: Secondary | ICD-10-CM | POA: Diagnosis present

## 2024-05-13 ENCOUNTER — Encounter: Payer: Self-pay | Admitting: Internal Medicine

## 2024-05-13 ENCOUNTER — Inpatient Hospital Stay: Payer: Medicare Other | Attending: Internal Medicine

## 2024-05-13 ENCOUNTER — Inpatient Hospital Stay: Payer: Medicare Other | Admitting: Internal Medicine

## 2024-05-13 VITALS — BP 131/67 | HR 64 | Temp 97.4°F | Resp 18 | Ht 65.0 in | Wt 101.5 lb

## 2024-05-13 DIAGNOSIS — C3412 Malignant neoplasm of upper lobe, left bronchus or lung: Secondary | ICD-10-CM | POA: Diagnosis not present

## 2024-05-13 LAB — CMP (CANCER CENTER ONLY)
ALT: 10 U/L (ref 0–44)
AST: 17 U/L (ref 15–41)
Albumin: 4.1 g/dL (ref 3.5–5.0)
Alkaline Phosphatase: 38 U/L (ref 38–126)
Anion gap: 10 (ref 5–15)
BUN: 10 mg/dL (ref 8–23)
CO2: 26 mmol/L (ref 22–32)
Calcium: 9.3 mg/dL (ref 8.9–10.3)
Chloride: 102 mmol/L (ref 98–111)
Creatinine: 0.85 mg/dL (ref 0.44–1.00)
GFR, Estimated: >60 mL/min
Glucose, Bld: 97 mg/dL (ref 70–99)
Potassium: 4 mmol/L (ref 3.5–5.1)
Sodium: 138 mmol/L (ref 135–145)
Total Bilirubin: 1.1 mg/dL (ref 0.0–1.2)
Total Protein: 6.8 g/dL (ref 6.5–8.1)

## 2024-05-13 LAB — CBC WITH DIFFERENTIAL (CANCER CENTER ONLY)
Abs Immature Granulocytes: 0.01 10*3/uL (ref 0.00–0.07)
Basophils Absolute: 0 10*3/uL (ref 0.0–0.1)
Basophils Relative: 1 %
Eosinophils Absolute: 0.1 10*3/uL (ref 0.0–0.5)
Eosinophils Relative: 1 %
HCT: 40.7 % (ref 36.0–46.0)
Hemoglobin: 13.9 g/dL (ref 12.0–15.0)
Immature Granulocytes: 0 %
Lymphocytes Relative: 35 %
Lymphs Abs: 2 10*3/uL (ref 0.7–4.0)
MCH: 32 pg (ref 26.0–34.0)
MCHC: 34.2 g/dL (ref 30.0–36.0)
MCV: 93.8 fL (ref 80.0–100.0)
Monocytes Absolute: 0.7 10*3/uL (ref 0.1–1.0)
Monocytes Relative: 12 %
Neutro Abs: 2.9 10*3/uL (ref 1.7–7.7)
Neutrophils Relative %: 51 %
Platelet Count: 231 10*3/uL (ref 150–400)
RBC: 4.34 MIL/uL (ref 3.87–5.11)
RDW: 12.9 % (ref 11.5–15.5)
WBC Count: 5.7 10*3/uL (ref 4.0–10.5)
nRBC: 0 % (ref 0.0–0.2)

## 2024-05-13 NOTE — Progress Notes (Signed)
 CXR 04/23/24. Mammogram 04/23/24.  Pt concerned with results of her chest xray.

## 2024-05-13 NOTE — Addendum Note (Signed)
 Addended by: JOSHUA ALFONSO CROME on: 05/13/2024 02:56 PM   Modules accepted: Orders

## 2024-05-13 NOTE — Assessment & Plan Note (Addendum)
#  Left upper lobe adenocarcinoma stage I;JAN 22nd, 2025- . Status post left upper lobe wedge resection, without recurrent or metastatic disease; Bilateral subpleural pulmonary nodules are unchanged and considered benign.JAN 2026-slight nodularity noted on the left lower posterior lobes.  No recent infections.  Recommend further evaluation with a CT scan.  # Post thoracotomy pain-- currently OFF tramadol / OFF neurontin  200 mg in AM; 200 mg in PM; stable.    # SIBO [[facility]]- stable.   # History of breast cancer stage I; JAN 2026- - Mammo-WNL- stable  #Incidental findings on Imaging CT, 2024:Aortic Atherosclerosis;reviewed/discussed/counseled the patient.   Dye allergy- neck swelling-benadryl  Q 51m # DISPOSITION: # CT scan in 1 week-  # follow up TBD- Dr.B ------------------ # Bil screening mammogram in 12 months # Follow-up in 12 months-MD; labs- cbc/cmp; - Dr.B  # I reviewed the blood work- with the patient in detail; also reviewed the imaging independently [as summarized above]; and with the patient in detail.

## 2024-05-13 NOTE — Progress Notes (Signed)
 I connected with Katie Franklin on 05/13/24 at  2:45 PM EST by video enabled telemedicine visit and verified that I am speaking with the correct person using two identifiers.  I discussed the limitations, risks, security and privacy concerns of performing an evaluation and management service by telemedicine and the availability of in-person appointments. I also discussed with the patient that there may be a patient responsible charge related to this service. The patient expressed understanding and agreed to proceed.    Other persons participating in the visit and their role in the encounter: RN/medical reconciliation Patients location: home Providers location: home  Oncology History Overview Note  # 2012- LEFT BREAST [pt1b- STAGE I; G-2];ER/PR-Pos; her 2 Neu-NEG;  RS- 19 [risk of recurrence 13%];s/p Lumpec& SLNBx; NO chemo; s/p RT; On Aromasin  [finish April 2017]  # MAY 2019- ADENO CA LUL STAGE IA [s/p wedge resection; Dr.Oaks; [facility]]  # Osteoporosis- stopped PO bisphosphonate sec to GI issues; On reclast every 12 months; defer to PCP.  Chronic back pain-Dr.Chasnis   DIAGNOSIS: [ ]  Breast ca Stage I ER/PR pos; Her 2NEG; LUL adeno stage I  GOALS: curative  CURRENT/MOST RECENT THERAPY- surveillance    Carcinoma of overlapping sites of left breast in female, estrogen receptor positive (HCC)  Primary cancer of left upper lobe of lung (HCC)     Chief Complaint: lung cancer/Hx of breast cancer    History of present illness:Katie Franklin 69 y.o.  female with history of breast cancer and also history of lung cancer is here for follow-up review chest x-ray.  Patient denies any shortness of breath or cough.  Denies any headaches.  No fever no chills.  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  However this is not any constant pain or any worsening pain.  Patient continues to take gabapentin .   Observation/objective: Alert &  oriented x 3. In No acute distress.   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 Status post left upper lobe wedge resection, without recurrent or metastatic disease; Bilateral subpleural pulmonary nodules are unchanged and considered benign. Since more than 5 years out-.will order CXR.   # Post thoracotomy pain-- currently OFF tramadol / OFF neurontin  200 mg in AM; 200 mg in PM; stable.    # SIBO [[facility]]- awaiting GI endo this week.   # History of breast cancer stage I; DEC 2024- Mammo-WNL- stable.dense breasts-  pt will check with insurance re: Breast MRI; and will let us  know.   #Incidental findings on Imaging CT, 2024:Aortic Atherosclerosis;reviewed/discussed/counseled the patient.   Dye allergy- neck swelling-benadryl  # DISPOSITION: # Bil screening mammogram dec # Follow-up in 12 months-MD; labs- cbc/cmp; CXR prior-- Dr.B  # I reviewed the blood work- with the patient in detail; also reviewed the imaging independently [as summarized above]; and with the patient in detail.   Follow-up instructions: I discussed the assessment and treatment plan with the patient.  The patient was provided an opportunity to ask questions and all were answered.  The patient agreed with the plan and demonstrated understanding of instructions.  The patient was advised to call back or seek an in person evaluation if the symptoms worsen or if the condition fails to improve as anticipated.    Dr. Hideo Googe [facility] at [facility] 05/13/2024 2:52 PM

## 2024-05-19 ENCOUNTER — Ambulatory Visit
Admission: RE | Admit: 2024-05-19 | Discharge: 2024-05-19 | Disposition: A | Source: Ambulatory Visit | Attending: Internal Medicine

## 2024-05-19 DIAGNOSIS — C3412 Malignant neoplasm of upper lobe, left bronchus or lung: Secondary | ICD-10-CM
